# Patient Record
Sex: Male | Born: 1965 | Race: Black or African American | Hispanic: No | Marital: Married | Smoking: Former smoker
Health system: Southern US, Community
[De-identification: ages and names within clinical notes are randomized; demographics above are authoritative.]

## PROBLEM LIST (undated history)

## (undated) DIAGNOSIS — M25551 Pain in right hip: Secondary | ICD-10-CM

## (undated) DIAGNOSIS — F17200 Nicotine dependence, unspecified, uncomplicated: Secondary | ICD-10-CM

## (undated) DIAGNOSIS — I739 Peripheral vascular disease, unspecified: Secondary | ICD-10-CM

## (undated) DIAGNOSIS — M1990 Unspecified osteoarthritis, unspecified site: Secondary | ICD-10-CM

## (undated) DIAGNOSIS — J189 Pneumonia, unspecified organism: Secondary | ICD-10-CM

## (undated) DIAGNOSIS — J45909 Unspecified asthma, uncomplicated: Secondary | ICD-10-CM

## (undated) DIAGNOSIS — E119 Type 2 diabetes mellitus without complications: Secondary | ICD-10-CM

## (undated) DIAGNOSIS — I10 Essential (primary) hypertension: Secondary | ICD-10-CM

## (undated) DIAGNOSIS — G8929 Other chronic pain: Secondary | ICD-10-CM

## (undated) DIAGNOSIS — K219 Gastro-esophageal reflux disease without esophagitis: Secondary | ICD-10-CM

## (undated) DIAGNOSIS — L309 Dermatitis, unspecified: Secondary | ICD-10-CM

## (undated) DIAGNOSIS — G4733 Obstructive sleep apnea (adult) (pediatric): Secondary | ICD-10-CM

## (undated) DIAGNOSIS — M549 Dorsalgia, unspecified: Secondary | ICD-10-CM

## (undated) HISTORY — DX: Unspecified osteoarthritis, unspecified site: M19.90

## (undated) HISTORY — PX: OTHER SURGICAL HISTORY: SHX169

## (undated) HISTORY — DX: Obstructive sleep apnea (adult) (pediatric): G47.33

## (undated) HISTORY — DX: Dermatitis, unspecified: L30.9

## (undated) HISTORY — PX: CYSTECTOMY: SUR359

## (undated) HISTORY — DX: Gastro-esophageal reflux disease without esophagitis: K21.9

## (undated) HISTORY — PX: FINGER SURGERY: SHX640

## (undated) HISTORY — PX: KNEE SURGERY: SHX244

---

## 1999-07-26 ENCOUNTER — Encounter: Payer: Self-pay | Admitting: Cardiology

## 2001-01-03 ENCOUNTER — Emergency Department (HOSPITAL_COMMUNITY): Admission: EM | Admit: 2001-01-03 | Discharge: 2001-01-03 | Payer: Self-pay | Admitting: Emergency Medicine

## 2001-01-03 ENCOUNTER — Encounter: Payer: Self-pay | Admitting: Emergency Medicine

## 2001-04-03 ENCOUNTER — Emergency Department (HOSPITAL_COMMUNITY): Admission: EM | Admit: 2001-04-03 | Discharge: 2001-04-03 | Payer: Self-pay | Admitting: *Deleted

## 2001-09-29 ENCOUNTER — Encounter: Payer: Self-pay | Admitting: *Deleted

## 2001-09-29 ENCOUNTER — Emergency Department (HOSPITAL_COMMUNITY): Admission: EM | Admit: 2001-09-29 | Discharge: 2001-09-29 | Payer: Self-pay | Admitting: *Deleted

## 2001-10-30 ENCOUNTER — Emergency Department (HOSPITAL_COMMUNITY): Admission: EM | Admit: 2001-10-30 | Discharge: 2001-10-30 | Payer: Self-pay | Admitting: *Deleted

## 2001-12-23 ENCOUNTER — Emergency Department (HOSPITAL_COMMUNITY): Admission: EM | Admit: 2001-12-23 | Discharge: 2001-12-23 | Payer: Self-pay | Admitting: *Deleted

## 2001-12-23 ENCOUNTER — Encounter: Payer: Self-pay | Admitting: *Deleted

## 2002-02-15 ENCOUNTER — Emergency Department (HOSPITAL_COMMUNITY): Admission: EM | Admit: 2002-02-15 | Discharge: 2002-02-15 | Payer: Self-pay | Admitting: Emergency Medicine

## 2002-11-09 ENCOUNTER — Emergency Department (HOSPITAL_COMMUNITY): Admission: EM | Admit: 2002-11-09 | Discharge: 2002-11-09 | Payer: Self-pay | Admitting: *Deleted

## 2004-12-28 ENCOUNTER — Emergency Department (HOSPITAL_COMMUNITY): Admission: EM | Admit: 2004-12-28 | Discharge: 2004-12-28 | Payer: Self-pay | Admitting: Emergency Medicine

## 2005-03-04 ENCOUNTER — Inpatient Hospital Stay (HOSPITAL_COMMUNITY): Admission: EM | Admit: 2005-03-04 | Discharge: 2005-03-05 | Payer: Self-pay | Admitting: *Deleted

## 2005-04-03 ENCOUNTER — Ambulatory Visit: Payer: Self-pay | Admitting: Internal Medicine

## 2005-04-03 ENCOUNTER — Inpatient Hospital Stay (HOSPITAL_COMMUNITY): Admission: AD | Admit: 2005-04-03 | Discharge: 2005-04-09 | Payer: Self-pay | Admitting: Orthopedic Surgery

## 2006-07-27 ENCOUNTER — Emergency Department (HOSPITAL_COMMUNITY): Admission: EM | Admit: 2006-07-27 | Discharge: 2006-07-27 | Payer: Self-pay | Admitting: Emergency Medicine

## 2006-09-19 ENCOUNTER — Emergency Department (HOSPITAL_COMMUNITY): Admission: EM | Admit: 2006-09-19 | Discharge: 2006-09-19 | Payer: Self-pay | Admitting: Emergency Medicine

## 2006-10-05 ENCOUNTER — Emergency Department (HOSPITAL_COMMUNITY): Admission: EM | Admit: 2006-10-05 | Discharge: 2006-10-05 | Payer: Self-pay | Admitting: Emergency Medicine

## 2007-05-01 ENCOUNTER — Emergency Department (HOSPITAL_COMMUNITY): Admission: EM | Admit: 2007-05-01 | Discharge: 2007-05-01 | Payer: Self-pay | Admitting: Emergency Medicine

## 2007-07-29 ENCOUNTER — Emergency Department (HOSPITAL_COMMUNITY): Admission: EM | Admit: 2007-07-29 | Discharge: 2007-07-29 | Payer: Self-pay | Admitting: Emergency Medicine

## 2007-08-14 ENCOUNTER — Encounter
Admission: RE | Admit: 2007-08-14 | Discharge: 2007-08-14 | Payer: Self-pay | Admitting: Physical Medicine & Rehabilitation

## 2007-08-14 ENCOUNTER — Ambulatory Visit: Payer: Self-pay | Admitting: Physical Medicine & Rehabilitation

## 2007-11-05 ENCOUNTER — Encounter
Admission: RE | Admit: 2007-11-05 | Discharge: 2007-11-07 | Payer: Self-pay | Admitting: Physical Medicine & Rehabilitation

## 2007-11-07 ENCOUNTER — Ambulatory Visit: Payer: Self-pay | Admitting: Physical Medicine & Rehabilitation

## 2007-12-02 ENCOUNTER — Emergency Department (HOSPITAL_COMMUNITY): Admission: EM | Admit: 2007-12-02 | Discharge: 2007-12-02 | Payer: Self-pay | Admitting: Emergency Medicine

## 2008-01-29 ENCOUNTER — Encounter
Admission: RE | Admit: 2008-01-29 | Discharge: 2008-01-30 | Payer: Self-pay | Admitting: Physical Medicine & Rehabilitation

## 2008-01-30 ENCOUNTER — Ambulatory Visit: Payer: Self-pay | Admitting: Physical Medicine & Rehabilitation

## 2008-03-30 ENCOUNTER — Emergency Department (HOSPITAL_COMMUNITY): Admission: EM | Admit: 2008-03-30 | Discharge: 2008-03-30 | Payer: Self-pay | Admitting: Emergency Medicine

## 2008-04-21 ENCOUNTER — Encounter (HOSPITAL_COMMUNITY): Admission: RE | Admit: 2008-04-21 | Discharge: 2008-05-21 | Payer: Self-pay | Admitting: Family Medicine

## 2008-04-26 ENCOUNTER — Encounter
Admission: RE | Admit: 2008-04-26 | Discharge: 2008-05-03 | Payer: Self-pay | Admitting: Physical Medicine & Rehabilitation

## 2008-05-03 ENCOUNTER — Ambulatory Visit: Payer: Self-pay | Admitting: Physical Medicine & Rehabilitation

## 2008-05-15 ENCOUNTER — Emergency Department (HOSPITAL_COMMUNITY): Admission: EM | Admit: 2008-05-15 | Discharge: 2008-05-16 | Payer: Self-pay | Admitting: Emergency Medicine

## 2008-05-31 ENCOUNTER — Ambulatory Visit (HOSPITAL_COMMUNITY): Admission: RE | Admit: 2008-05-31 | Discharge: 2008-05-31 | Payer: Self-pay | Admitting: Family Medicine

## 2008-08-17 ENCOUNTER — Encounter
Admission: RE | Admit: 2008-08-17 | Discharge: 2008-08-19 | Payer: Self-pay | Admitting: Physical Medicine and Rehabilitation

## 2008-08-19 ENCOUNTER — Ambulatory Visit (HOSPITAL_COMMUNITY)
Admission: RE | Admit: 2008-08-19 | Discharge: 2008-08-19 | Payer: Self-pay | Admitting: Physical Medicine and Rehabilitation

## 2008-08-19 ENCOUNTER — Ambulatory Visit: Payer: Self-pay | Admitting: Physical Medicine and Rehabilitation

## 2008-12-26 ENCOUNTER — Ambulatory Visit: Admission: RE | Admit: 2008-12-26 | Discharge: 2008-12-26 | Payer: Self-pay | Admitting: Family Medicine

## 2009-08-26 ENCOUNTER — Emergency Department (HOSPITAL_COMMUNITY): Admission: EM | Admit: 2009-08-26 | Discharge: 2009-08-26 | Payer: Self-pay | Admitting: Emergency Medicine

## 2009-08-28 ENCOUNTER — Emergency Department (HOSPITAL_COMMUNITY): Admission: EM | Admit: 2009-08-28 | Discharge: 2009-08-28 | Payer: Self-pay | Admitting: Emergency Medicine

## 2009-10-31 ENCOUNTER — Emergency Department (HOSPITAL_COMMUNITY): Admission: EM | Admit: 2009-10-31 | Discharge: 2009-10-31 | Payer: Self-pay | Admitting: Emergency Medicine

## 2010-06-21 ENCOUNTER — Emergency Department (HOSPITAL_COMMUNITY)
Admission: EM | Admit: 2010-06-21 | Discharge: 2010-06-21 | Payer: Self-pay | Source: Home / Self Care | Admitting: Emergency Medicine

## 2010-07-09 ENCOUNTER — Emergency Department (HOSPITAL_COMMUNITY)
Admission: EM | Admit: 2010-07-09 | Discharge: 2010-07-09 | Payer: Self-pay | Source: Home / Self Care | Admitting: Emergency Medicine

## 2010-07-17 LAB — GLUCOSE, CAPILLARY: Glucose-Capillary: 103 mg/dL — ABNORMAL HIGH (ref 70–99)

## 2010-07-24 ENCOUNTER — Encounter: Payer: Self-pay | Admitting: Family Medicine

## 2010-09-09 ENCOUNTER — Emergency Department (HOSPITAL_COMMUNITY): Payer: Medicare Other

## 2010-09-09 ENCOUNTER — Emergency Department (HOSPITAL_COMMUNITY)
Admission: EM | Admit: 2010-09-09 | Discharge: 2010-09-09 | Disposition: A | Discharge status: Home / Self Care | Payer: Medicare Other | Attending: Emergency Medicine | Admitting: Emergency Medicine

## 2010-09-09 DIAGNOSIS — R079 Chest pain, unspecified: Secondary | ICD-10-CM | POA: Insufficient documentation

## 2010-09-09 DIAGNOSIS — M79609 Pain in unspecified limb: Secondary | ICD-10-CM | POA: Insufficient documentation

## 2010-09-09 DIAGNOSIS — IMO0001 Reserved for inherently not codable concepts without codable children: Secondary | ICD-10-CM | POA: Insufficient documentation

## 2010-09-09 DIAGNOSIS — M25569 Pain in unspecified knee: Secondary | ICD-10-CM | POA: Insufficient documentation

## 2010-09-09 DIAGNOSIS — M5430 Sciatica, unspecified side: Secondary | ICD-10-CM | POA: Insufficient documentation

## 2010-09-09 DIAGNOSIS — M545 Low back pain, unspecified: Secondary | ICD-10-CM | POA: Insufficient documentation

## 2010-09-09 DIAGNOSIS — R0602 Shortness of breath: Secondary | ICD-10-CM | POA: Insufficient documentation

## 2010-09-09 DIAGNOSIS — R609 Edema, unspecified: Secondary | ICD-10-CM | POA: Insufficient documentation

## 2010-09-09 LAB — COMPREHENSIVE METABOLIC PANEL
ALT: 38 U/L (ref 0–53)
CO2: 24 mEq/L (ref 19–32)
Calcium: 8.8 mg/dL (ref 8.4–10.5)
Chloride: 105 mEq/L (ref 96–112)
Creatinine, Ser: 1.21 mg/dL (ref 0.4–1.5)
GFR calc non Af Amer: >60 mL/min (ref >60)
Glucose, Bld: 103 mg/dL — ABNORMAL HIGH (ref 70–99)
Total Bilirubin: 0.3 mg/dL (ref 0.3–1.2)

## 2010-09-09 LAB — CBC
MCH: 27.9 pg (ref 26.0–34.0)
MCHC: 32.9 g/dL (ref 30.0–36.0)
RDW: 14 % (ref 11.5–15.5)

## 2010-09-09 LAB — POCT CARDIAC MARKERS: Myoglobin, poc: 110 ng/mL (ref 12–200)

## 2010-09-11 LAB — CBC
MCV: 83.4 fL (ref 78.0–100.0)
Platelets: 237 10*3/uL (ref 150–400)
RBC: 5.05 MIL/uL (ref 4.22–5.81)
RDW: 13.5 % (ref 11.5–15.5)
WBC: 6.1 10*3/uL (ref 4.0–10.5)

## 2010-09-11 LAB — DIFFERENTIAL
Basophils Absolute: 0 10*3/uL (ref 0.0–0.1)
Eosinophils Relative: 2 % (ref 0–5)
Lymphocytes Relative: 38 % (ref 12–46)
Lymphs Abs: 2.3 10*3/uL (ref 0.7–4.0)
Neutro Abs: 3 10*3/uL (ref 1.7–7.7)
Neutrophils Relative %: 49 % (ref 43–77)

## 2010-09-11 LAB — BASIC METABOLIC PANEL
BUN: 18 mg/dL (ref 6–23)
Chloride: 103 mEq/L (ref 96–112)
Creatinine, Ser: 1.33 mg/dL (ref 0.4–1.5)
GFR calc Af Amer: >60 mL/min (ref >60)
GFR calc non Af Amer: 58 mL/min — ABNORMAL LOW (ref >60)

## 2010-09-11 LAB — POCT CARDIAC MARKERS
CKMB, poc: 4 ng/mL (ref 1.0–8.0)
Myoglobin, poc: 378 ng/mL (ref 12–200)
Troponin i, poc: <0.05 ng/mL (ref 0.00–0.09)

## 2010-09-20 LAB — CULTURE, ROUTINE-ABSCESS: Culture: NO GROWTH
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
## 2010-11-14 NOTE — Procedures (Signed)
NAME:  Terry Case, Terry Case NO.:  [id number]   MEDICAL RECORD NO.:  [id number]          PATIENT TYPE:  OUT   LOCATION:  SLEE                          FACILITY:  [facility]   PHYSICIAN:  Kofi A. Gerilyn Pilgrim, M.D. DATE OF BIRTH:  April 24, 1966   DATE OF PROCEDURE:  12/26/2008  DATE OF DISCHARGE:  12/26/2008                             SLEEP DISORDER REPORT   REFERRING PHYSICIAN:  Annia Friendly. Hill, MD   INDICATIONS:  A 45 year old man who presents with marked hypersomnia and  snoring.  He is being evaluated for obstructive sleep apnea syndrome.   MEDICATIONS:  Claritin, doxycycline, amphetamine, Pepcid, hydroxyzine.   EPWORTH SLEEPINESS SCALE:  1. BMI 37.   ARCHITECTURAL SUMMARY:  This is a split night recording with the first  part being a diagnostic and the second part a titration recording.  The  total recording time is 4-9 minutes.  Sleep efficiency 90.9%, sleep  latency 16 minutes, REM latency 83.5 minutes.   RESPIRATORY SUMMARY:  Baseline oxygen saturation 95, lowest saturation  74.  The diagnostic AHI is 28.  The patient was titrated between  pressures of 5 and 14.  The optimal pressure is 13 with resolution of  events and good tolerability.   LIMB MOVEMENTS SUMMARY:  PLM index -   ELECTROCARDIOGRAM SUMMARY:  Average heart rate 70 with no significant  dysrhythmias observed.   IMPRESSION:  1. Moderate obstructive sleep apnea syndrome which responds well to a      continuous positive airway pressure of 13.  2. Moderate-to-severe periodic limb movement disorder sleep.      Kofi A. Gerilyn Pilgrim, M.D.  Electronically Signed     KAD/MEDQ  D:  01/04/2009  T:  01/04/2009  Job:  [id number]

## 2010-11-14 NOTE — Assessment & Plan Note (Signed)
Terry Case returns to clinic today for followup evaluation.  Reports  that he is using a Duragesic patch at 25 mcg per hour, but gets only  fair relief.  He reports that he has significant pain in his left lower  back which he tries to also treat using hot showers.  That only helps  temporarily.  He would like to have an adjustment of his pain medicines.   MEDICATIONS:  1. Allegra 180 mg daily.  2. Pepcid 20 mg daily p.r.n.  3. Adderall 10 mg daily.  4. Duragesic patch 25 mcg per hour, change q.72 h.   REVIEW OF SYSTEMS:  Noncontributory.   PHYSICAL EXAMINATION:  GENERAL:  A well-appearing, well-nourished adult  male in moderate acute discomfort.  VITAL SIGNS:  Not obtained in the office today.  NEUROLOGICAL:  He is 5/5 strength throughout.  He has decreased lumbar  range of motion especially in lateral bending and rotation towards the  left.   IMPRESSION:  1. Chronic low back pain.  Reported work injury 2001.  2. History of narcolepsy presently on Adderall.  3. History of drug abuse previously incarcerated with drug abstinence      for approximately one year.   In the office today we did increase the patient's Duragesic patch to a  50 mcg per hour patch as of Nov 21, 2007.  We also completed paperwork  so that he can have his office note sent to person helping him with  disability application in [place].  Will plan on seeing the patient in  followup in this office in approximately three months' time.   I have warned the patient against misuse of the medication.  He reports  that he will have hang on to the prescription, and use the present  Duragesic patch at 25 mcg per hour strength until the refill is due Nov 21, 2007.           ______________________________  Ellwood Dense, M.D.     DC/MedQ  D:  11/07/2007 12:49:04  T:  11/07/2007 13:31:13  Job #:  [id number]

## 2010-11-14 NOTE — Assessment & Plan Note (Signed)
HISTORY OF PRESENT ILLNESS:  Terry Case returns to the clinic today  for followup evaluation. He was first and last seen in this office on  August 18, 2007 for evaluation of chronic low back pain. At that time,  we decided to continue the Tramadol and increased it up to 50 mg q.i.d.  p.r.n. We also gave him a prescription for Duragesic patch to be used 25  mcg per hour, change q.72 hours. He reports that he became somewhat  confused, taking the Ultram and the Duragesic. He tried taking the  Ultram by itself and also became confused, so he decided to stop that  medication. He has been using the Duragesic patch at 25 mcg q. hour,  change q.72 hours and getting reasonable relief. He does need a refill  on that in the office today. He is not taking any Ultram at this point.   MEDICATIONS:  1. Allegra 180 mg.  2. Pepcid (not using.)  3. Adderall 10 mg daily.  4. Pepcid 20 mg daily.  5. Duragesic patch 25 mcg per hour, change q.72 hours.   REVIEW OF SYSTEMS:  Noncontributory.   PHYSICAL EXAMINATION:  GENERAL:  A well appearing, overweight, adult  male. In mild, no acute discomfort.  VITAL SIGNS:  Not obtained in the office today.  NEUROLOGIC:  He has 5/5 strength throughout. Bulk and tone were normal  and reflexes were 2+ and symmetrical. Sensation was intact to light  touch throughout the bilateral upper and lower extremities.   IMPRESSION:  1. Chronic low back pain with reported work injury in 2001.  2. History of narcolepsy, presently on Adderall.  3. History of drug abuse, previously incarcerated, with drug      abstinence for approximately 1 year.   PLAN:  In the office today, we did refill the patient's Duragesic patch  at 25 mcg per hour, change q.72 hours. He will not be taking the Ultram  at all at this point. We will plan on seeing him in followup in  approximately 2 months time with refill prior to that appointment as  necessary.     ______________________________  Ellwood Dense, M.D.     DC/MedQ  D:  09/12/2007 13:12:43  T:  09/12/2007 20:36:37  Job #:  [id number]
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
## 2010-11-14 NOTE — Assessment & Plan Note (Signed)
Mr. Decou returned to clinic today for followup evaluation.  He does  report that the Duragesic patch had a 15 mcg/hr strength, is helping him  more than the 25 mcg strength.  He reports that he has an occasional  diarrhea, which comes and goes, but he feels that may be related to some  of his other medicines.  He stopped his Allegra, but continues to take  other medicines as noted below.  The patient does need a refill on his  Duragesic patch over the next few days.   MEDICATIONS:  1. Pepcid 20 mg daily p.r.n.  2. Adderall 10 mg daily.  3. Duragesic patch 15 mcg/hr, changed every 72 hours.  4. hydroxyzine p.r.n.   REVIEW OF SYSTEMS:  Noncontributory.   PHYSICAL EXAMINATION:  GENERAL:  Well-appearing, well-nourished adult  male in mild-to-no acute discomfort.  VITAL SIGNS:  Not obtained.  He has 5/5 strength throughout.  He has  decreased range of motion in his lumbar spine, especially in lateral  bending and rotation.   IMPRESSION:  1. Chronic low back pain after reported work injury in 2001.  2. History of narcolepsy, presently on Adderall.  3. History of drug abuse, recently incarcerated with drug abstinence      for approximately 1 year.   In the office today, we did refill the patient's Duragesic patch at 15  mcg/hr strength, changed every 72 hours as of February 06, 2008.  No other  medication refills are necessary at this time.  We will continue to see  the patient in followup in approximately 3 months' time with refills  prior to that appointment if necessary.           ______________________________  Ellwood Dense, M.D.     DC/MedQ  D:  01/30/2008 13:06:21  T:  01/31/2008 04:52:41  Job #:  [id number]
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
## 2011-03-18 ENCOUNTER — Emergency Department (HOSPITAL_COMMUNITY)
Admission: EM | Admit: 2011-03-18 | Discharge: 2011-03-18 | Disposition: A | Discharge status: Home / Self Care | Payer: Medicare Other | Attending: Emergency Medicine | Admitting: Emergency Medicine

## 2011-03-18 ENCOUNTER — Encounter: Payer: Self-pay | Admitting: *Deleted

## 2011-03-18 DIAGNOSIS — F17200 Nicotine dependence, unspecified, uncomplicated: Secondary | ICD-10-CM | POA: Insufficient documentation

## 2011-03-18 DIAGNOSIS — M549 Dorsalgia, unspecified: Secondary | ICD-10-CM | POA: Insufficient documentation

## 2011-03-18 DIAGNOSIS — K219 Gastro-esophageal reflux disease without esophagitis: Secondary | ICD-10-CM

## 2011-03-18 DIAGNOSIS — R1013 Epigastric pain: Secondary | ICD-10-CM | POA: Insufficient documentation

## 2011-03-18 HISTORY — DX: Other chronic pain: G89.29

## 2011-03-18 HISTORY — DX: Dorsalgia, unspecified: M54.9

## 2011-03-18 LAB — COMPREHENSIVE METABOLIC PANEL
ALT: 33 U/L (ref 0–53)
Alkaline Phosphatase: 94 U/L (ref 39–117)
BUN: 15 mg/dL (ref 6–23)
CO2: 26 mEq/L (ref 19–32)
Calcium: 9.2 mg/dL (ref 8.4–10.5)
GFR calc Af Amer: >60 mL/min (ref >60)
GFR calc non Af Amer: >60 mL/min (ref >60)
Glucose, Bld: 88 mg/dL (ref 70–99)
Potassium: 3.9 mEq/L (ref 3.5–5.1)
Sodium: 136 mEq/L (ref 135–145)

## 2011-03-18 LAB — URINALYSIS, ROUTINE W REFLEX MICROSCOPIC
Bilirubin Urine: NEGATIVE
Glucose, UA: NEGATIVE mg/dL
Hgb urine dipstick: NEGATIVE
Specific Gravity, Urine: 1.02 (ref 1.005–1.030)
Urobilinogen, UA: 0.2 mg/dL (ref 0.0–1.0)
pH: 8.5 — ABNORMAL HIGH (ref 5.0–8.0)

## 2011-03-18 LAB — CBC
Hemoglobin: 14.4 g/dL (ref 13.0–17.0)
MCH: 28 pg (ref 26.0–34.0)
MCV: 84.4 fL (ref 78.0–100.0)
Platelets: 232 10*3/uL (ref 150–400)
RBC: 5.14 MIL/uL (ref 4.22–5.81)
WBC: 6 10*3/uL (ref 4.0–10.5)

## 2011-03-18 LAB — DIFFERENTIAL
Eosinophils Relative: 2 % (ref 0–5)
Lymphocytes Relative: 31 % (ref 12–46)
Lymphs Abs: 1.9 10*3/uL (ref 0.7–4.0)
Monocytes Relative: 9 % (ref 3–12)

## 2011-03-18 LAB — LIPASE, BLOOD: Lipase: 45 U/L (ref 11–59)

## 2011-03-18 MED ORDER — FAMOTIDINE 20 MG PO TABS
20.0000 mg | ORAL_TABLET | Freq: Once | ORAL | Status: AC
  Administered 2011-03-18: 20 mg via ORAL
  Filled 2011-03-18: qty 1

## 2011-03-18 MED ORDER — PANTOPRAZOLE SODIUM 40 MG PO TBEC
40.0000 mg | DELAYED_RELEASE_TABLET | Freq: Once | ORAL | Status: AC
  Administered 2011-03-18: 40 mg via ORAL
  Filled 2011-03-18: qty 1

## 2011-03-18 MED ORDER — OMEPRAZOLE 20 MG PO CPDR: 20.0000 mg | DELAYED_RELEASE_CAPSULE | Freq: Every day | ORAL | Status: DC

## 2011-03-18 MED ORDER — GI COCKTAIL ~~LOC~~
30.0000 mL | Freq: Once | ORAL | Status: AC
  Administered 2011-03-18: 30 mL via ORAL
  Filled 2011-03-18: qty 30

## 2011-03-18 MED ORDER — FAMOTIDINE 20 MG PO TABS: 20.0000 mg | ORAL_TABLET | Freq: Two times a day (BID) | ORAL | Status: DC

## 2011-03-18 NOTE — ED Notes (Signed)
Pt states intermittent abdominal pain began last night. States, "It feels like a ball in my stomach and goes all the way up to my throat. It is sharp and severe and comes about every eight minutes." per pt. Denies N/V/D. Last BM was this morning.

## 2011-03-18 NOTE — ED Provider Notes (Signed)
Scribed for Terry Bailiff, MD, the patient was seen in room APA19/APA19 . This chart was scribed by Ellie Lunch. This patient's care was started at 7:40 PM.   CSN: [id number] Arrival date & time: 03/18/2011  6:04 PM  Chief Complaint  Patient presents with  . Abdominal Pain   (Include location/radiation/quality/duration/timing/severity/associated sxs/prior treatment) HPI Terry Case is a 45 y.o. male who presents to the Emergency Department complaining of intermittent epigastric abdominal pain beginning last night ~7:30. Starts in epigastric abdomen and radiates upward. Gets worse after he eats. Gets worse when he lays down. Reports a history of GERD "while ago". Pt took baking soda and zantac. Denies bloody stools Denies N/V/D.   HPI ELEMENTS:  Location: epigastric abdomen Onset: last night  Timing: intermittent  Quality: sharp Severity: severe Modifying factors: aggravated by eating esp fried foods, supine position Context:as above    PCP Dr. Loleta Chance  Past Medical History  Diagnosis Date  . Chronic back pain     Past Surgical History  Procedure Date  . Arm surgery    No family history on file.  History  Substance Use Topics  . Smoking status: Current Everyday Smoker  . Smokeless tobacco: Not on file  . Alcohol Use: No    Review of Systems 10 Systems reviewed and are negative for acute change except as noted in the HPI.  Allergies  Vicodin  Home Medications   Current Outpatient Rx  Name Route Sig Dispense Refill  . FAMOTIDINE 20 MG PO TABS Oral Take 1 tablet (20 mg total) by mouth 2 (two) times daily. 60 tablet 0  . OMEPRAZOLE 20 MG PO CPDR Oral Take 1 capsule (20 mg total) by mouth daily. 30 capsule 0    Physical Exam    BP 140/82  Pulse 75  Temp(Src) 98.7 F (37.1 C) (Oral)  Resp 16  Ht 5\' 8"  (1.727 m)  Wt 260 lb (117.935 kg)  BMI 39.53 kg/m2  SpO2 97%  Physical Exam  Nursing note and vitals reviewed. Constitutional: He is oriented to person,  place, and time. He appears well-developed and well-nourished.  HENT:  Head: Normocephalic and atraumatic.  Eyes: Conjunctivae and EOM are normal. Pupils are equal, round, and reactive to light.  Neck: Normal range of motion. Neck supple.  Cardiovascular: Normal rate, regular rhythm and normal heart sounds.   Pulmonary/Chest: Effort normal and breath sounds normal.  Abdominal: Soft. There is no tenderness.  Musculoskeletal: Normal range of motion.       No CVA tenderness  Neurological: He is alert and oriented to person, place, and time.  Skin: Skin is warm and dry.  Psychiatric: He has a normal mood and affect.   Procedures  OTHER DATA REVIEWED: Nursing notes, vital signs, and past medical records reviewed.  DIAGNOSTIC STUDIES: Oxygen Saturation is 97% on room air, normal by my interpretation.    LABS / RADIOLOGY:  Results for orders placed during the hospital encounter of 03/18/11  URINALYSIS, ROUTINE W REFLEX MICROSCOPIC      Component Value Range   Color, Urine YELLOW  YELLOW    Appearance CLEAR  CLEAR    Specific Gravity, Urine 1.020  1.005 - 1.030    pH 8.5 (*) 5.0 - 8.0    Glucose, UA NEGATIVE  NEGATIVE (mg/dL)   Hgb urine dipstick NEGATIVE  NEGATIVE    Bilirubin Urine NEGATIVE  NEGATIVE    Ketones, ur NEGATIVE  NEGATIVE (mg/dL)   Protein, ur NEGATIVE  NEGATIVE (mg/dL)  Urobilinogen, UA 0.2  0.0 - 1.0 (mg/dL)   Nitrite NEGATIVE  NEGATIVE    Leukocytes, UA NEGATIVE  NEGATIVE   CBC      Component Value Range   WBC 6.0  4.0 - 10.5 (K/uL)   RBC 5.14  4.22 - 5.81 (MIL/uL)   Hemoglobin 14.4  13.0 - 17.0 (g/dL)   HCT 16.1  09.6 - 04.5 (%)   MCV 84.4  78.0 - 100.0 (fL)   MCH 28.0  26.0 - 34.0 (pg)   MCHC 33.2  30.0 - 36.0 (g/dL)   RDW 40.9  81.1 - 91.4 (%)   Platelets 232  150 - 400 (K/uL)  DIFFERENTIAL      Component Value Range   Neutrophils Relative 57  43 - 77 (%)   Neutro Abs 3.4  1.7 - 7.7 (K/uL)   Lymphocytes Relative 31  12 - 46 (%)   Lymphs Abs 1.9  0.7  - 4.0 (K/uL)   Monocytes Relative 9  3 - 12 (%)   Monocytes Absolute 0.5  0.1 - 1.0 (K/uL)   Eosinophils Relative 2  0 - 5 (%)   Eosinophils Absolute 0.1  0.0 - 0.7 (K/uL)   Basophils Relative 1  0 - 1 (%)   Basophils Absolute 0.0  0.0 - 0.1 (K/uL)  COMPREHENSIVE METABOLIC PANEL      Component Value Range   Sodium 136  135 - 145 (mEq/L)   Potassium 3.9  3.5 - 5.1 (mEq/L)   Chloride 101  96 - 112 (mEq/L)   CO2 26  19 - 32 (mEq/L)   Glucose, Bld 88  70 - 99 (mg/dL)   BUN 15  6 - 23 (mg/dL)   Creatinine, Ser 7.82  0.50 - 1.35 (mg/dL)   Calcium 9.2  8.4 - 95.6 (mg/dL)   Total Protein 7.2  6.0 - 8.3 (g/dL)   Albumin 3.8  3.5 - 5.2 (g/dL)   AST 24  0 - 37 (U/L)   ALT 33  0 - 53 (U/L)   Alkaline Phosphatase 94  39 - 117 (U/L)   Total Bilirubin 0.2 (*) 0.3 - 1.2 (mg/dL)   GFR calc non Af Amer >60  >60 (mL/min)   GFR calc Af Amer >60  >60 (mL/min)  LIPASE, BLOOD      Component Value Range   Lipase 45  11 - 59 (U/L)   ED COURSE / COORDINATION OF CARE: 18:25 Discussed diagnostic probabilities such as GERD and discussed treatment with PT including GI cocktail, Pepcid, and Protonix.  Orders Placed This Encounter  Procedures  . Urinalysis with microscopic  . CBC  . Differential  . Comprehensive metabolic panel  . Lipase, blood   Medications  gi cocktail   famotidine (PEPCID) tablet 20 mg   pantoprazole (PROTONIX) EC tablet 40 mg   19:20 Pt recheck. Discussed lab results with pt and plan to discharge. Pt reports improvement with meds.   MDM: Based on the history and physical examination I feel the patient's symptoms are consistent with gastroesophageal reflux disease. I'm not concerned about cardiac etiology or malignant cause abdominal pain at this time. Patient has no reported blood in the stool. Screening labs were obtained and there no abnormalities. Patient received a GI cocktail, Pepcid, Protonix numerous department with some improvement of his symptoms. At this time I feel the  patient is safe for discharge home with instructions to followup with his primary care physician. I'll prescribe Pepcid and Prilosec. He is provided signs and  symptoms for which to return the emergency department.  SCRIBE ATTESTATION: I personally performed the services described in this documentation, which was scribed in my presence. The recorded information has been reviewed and considered.         Terry Bailiff, MD 03/18/11 [phone]

## 2011-04-03 LAB — DIFFERENTIAL
Basophils Absolute: 0
Eosinophils Relative: 1
Lymphocytes Relative: 13
Neutro Abs: 6.7
Neutrophils Relative %: 81 — ABNORMAL HIGH

## 2011-04-03 LAB — CBC
Platelets: 246
RDW: 14.3

## 2011-04-03 LAB — BLOOD GAS, ARTERIAL
Acid-Base Excess: 0.5
FIO2: 0.21
pCO2 arterial: 42.4
pH, Arterial: 7.39

## 2011-04-03 LAB — CK TOTAL AND CKMB (NOT AT ARMC)
CK, MB: 9.4 — ABNORMAL HIGH
Relative Index: 1
Total CK: 988 — ABNORMAL HIGH

## 2011-04-03 LAB — BASIC METABOLIC PANEL
BUN: 19
Calcium: 9.6
Creatinine, Ser: 1.11
GFR calc non Af Amer: >60
Glucose, Bld: 112 — ABNORMAL HIGH

## 2011-04-03 LAB — POCT CARDIAC MARKERS: CKMB, poc: 9.5

## 2011-04-03 LAB — B-NATRIURETIC PEPTIDE (CONVERTED LAB): Pro B Natriuretic peptide (BNP): <30

## 2011-07-26 DIAGNOSIS — IMO0002 Reserved for concepts with insufficient information to code with codable children: Secondary | ICD-10-CM | POA: Insufficient documentation

## 2011-07-26 DIAGNOSIS — F17200 Nicotine dependence, unspecified, uncomplicated: Secondary | ICD-10-CM | POA: Insufficient documentation

## 2011-07-26 DIAGNOSIS — J069 Acute upper respiratory infection, unspecified: Secondary | ICD-10-CM | POA: Insufficient documentation

## 2011-07-26 DIAGNOSIS — Z79899 Other long term (current) drug therapy: Secondary | ICD-10-CM | POA: Insufficient documentation

## 2011-07-26 DIAGNOSIS — R079 Chest pain, unspecified: Secondary | ICD-10-CM | POA: Insufficient documentation

## 2011-07-26 DIAGNOSIS — K219 Gastro-esophageal reflux disease without esophagitis: Secondary | ICD-10-CM | POA: Insufficient documentation

## 2011-07-27 ENCOUNTER — Encounter (HOSPITAL_COMMUNITY): Payer: Self-pay

## 2011-07-27 ENCOUNTER — Emergency Department (HOSPITAL_COMMUNITY)
Admission: EM | Admit: 2011-07-27 | Discharge: 2011-07-27 | Disposition: A | Discharge status: Home / Self Care | Payer: Medicare Other | Attending: Emergency Medicine | Admitting: Emergency Medicine

## 2011-07-27 ENCOUNTER — Emergency Department (HOSPITAL_COMMUNITY): Payer: Medicare Other

## 2011-07-27 DIAGNOSIS — IMO0002 Reserved for concepts with insufficient information to code with codable children: Secondary | ICD-10-CM | POA: Diagnosis not present

## 2011-07-27 DIAGNOSIS — K219 Gastro-esophageal reflux disease without esophagitis: Secondary | ICD-10-CM | POA: Diagnosis not present

## 2011-07-27 DIAGNOSIS — J069 Acute upper respiratory infection, unspecified: Secondary | ICD-10-CM

## 2011-07-27 DIAGNOSIS — R05 Cough: Secondary | ICD-10-CM | POA: Diagnosis not present

## 2011-07-27 DIAGNOSIS — R0602 Shortness of breath: Secondary | ICD-10-CM | POA: Diagnosis not present

## 2011-07-27 DIAGNOSIS — Z79899 Other long term (current) drug therapy: Secondary | ICD-10-CM | POA: Diagnosis not present

## 2011-07-27 DIAGNOSIS — F17200 Nicotine dependence, unspecified, uncomplicated: Secondary | ICD-10-CM | POA: Diagnosis not present

## 2011-07-27 DIAGNOSIS — R079 Chest pain, unspecified: Secondary | ICD-10-CM | POA: Diagnosis not present

## 2011-07-27 HISTORY — DX: Gastro-esophageal reflux disease without esophagitis: K21.9

## 2011-07-27 MED ORDER — ALBUTEROL SULFATE (5 MG/ML) 0.5% IN NEBU
5.0000 mg | INHALATION_SOLUTION | Freq: Once | RESPIRATORY_TRACT | Status: AC
  Administered 2011-07-27: 5 mg via RESPIRATORY_TRACT
  Filled 2011-07-27: qty 1

## 2011-07-27 MED ORDER — ALBUTEROL SULFATE HFA 108 (90 BASE) MCG/ACT IN AERS
1.0000 | INHALATION_SPRAY | Freq: Four times a day (QID) | RESPIRATORY_TRACT | Status: DC
  Administered 2011-07-27: 1 via RESPIRATORY_TRACT
  Filled 2011-07-27: qty 6.7

## 2011-07-27 MED ORDER — OSELTAMIVIR PHOSPHATE 75 MG PO CAPS: 75.0000 mg | ORAL_CAPSULE | Freq: Once | ORAL | Status: DC

## 2011-07-27 MED ORDER — ALBUTEROL SULFATE HFA 108 (90 BASE) MCG/ACT IN AERS: 2.0000 | INHALATION_SPRAY | RESPIRATORY_TRACT | Status: DC | PRN

## 2011-07-27 MED ORDER — IPRATROPIUM BROMIDE 0.02 % IN SOLN
0.5000 mg | Freq: Once | RESPIRATORY_TRACT | Status: AC
  Administered 2011-07-27: 0.5 mg via RESPIRATORY_TRACT
  Filled 2011-07-27: qty 2.5

## 2011-07-27 MED ORDER — OSELTAMIVIR PHOSPHATE 75 MG PO CAPS
75.0000 mg | ORAL_CAPSULE | Freq: Once | ORAL | Status: AC
  Administered 2011-07-27: 75 mg via ORAL
  Filled 2011-07-27: qty 1

## 2011-07-27 MED ORDER — PREDNISONE 50 MG PO TABS: 50.0000 mg | ORAL_TABLET | Freq: Every day | ORAL | Status: AC

## 2011-07-27 NOTE — ED Provider Notes (Signed)
History     CSN: [id number]  Arrival date & time 07/26/11  2349   First MD Initiated Contact with Patient 07/27/11 0016      Chief Complaint  Patient presents with  . Nasal Congestion    lump on chest    (Consider location/radiation/quality/duration/timing/severity/associated sxs/prior treatment) HPI....cough and chest congestion for 24 hours. Patient is smoker. Nothing makes symptoms better or worse. Described as minimal to moderate. No radiation of pain. Cough is sharp. No fever sweats or chills.      Past Medical History  Diagnosis Date  . Chronic back pain   . Acid reflux     Past Surgical History  Procedure Date  . Arm surgery     No family history on file.  History  Substance Use Topics  . Smoking status: Current Everyday Smoker    Types: Cigarettes  . Smokeless tobacco: Not on file  . Alcohol Use: No      Review of Systems  All other systems reviewed and are negative.    Allergies  Vicodin  Home Medications   Current Outpatient Rx  Name Route Sig Dispense Refill  . FAMOTIDINE 20 MG PO TABS Oral Take 1 tablet (20 mg total) by mouth 2 (two) times daily. 60 tablet 0  . OMEPRAZOLE 20 MG PO CPDR Oral Take 1 capsule (20 mg total) by mouth daily. 30 capsule 0  . PREDNISONE 50 MG PO TABS Oral Take 1 tablet (50 mg total) by mouth daily. 7 tablet 1    BP 143/85  Pulse 76  Temp(Src) 98.7 F (37.1 C) (Oral)  Resp 16  Ht 5\' 8"  (1.727 m)  Wt 260 lb (117.935 kg)  BMI 39.53 kg/m2  SpO2 97%  Physical Exam  Nursing note and vitals reviewed. Constitutional: He is oriented to person, place, and time. He appears well-developed and well-nourished.  HENT:  Head: Normocephalic and atraumatic.  Eyes: Conjunctivae and EOM are normal. Pupils are equal, round, and reactive to light.  Neck: Normal range of motion. Neck supple.  Cardiovascular: Normal rate and regular rhythm.   Pulmonary/Chest: Effort normal.       Slight expiratory wheeze  Abdominal: Soft.  Bowel sounds are normal.  Musculoskeletal: Normal range of motion.  Neurological: He is alert and oriented to person, place, and time.  Skin: Skin is warm and dry.  Psychiatric: He has a normal mood and affect.    ED Course  Procedures (including critical care time)  Labs Reviewed - No data to display Dg Chest 2 View  07/27/2011  *RADIOLOGY REPORT*  Clinical Data: Shortness of breath, medial chest pain, cough and congestion for 2 days; history of smoking.  CHEST - 2 VIEW  Comparison: Chest radiograph performed 09/09/2010  Findings: The lungs are well-aerated.  There is no evidence of focal opacification, pleural effusion or pneumothorax.  Mild vascular congestion is noted.  The heart is normal in size; the mediastinal contour is within normal limits.  No acute osseous abnormalities are seen.  IMPRESSION: Mild vascular congestion; lungs remain grossly clear.  Original Report Authenticated By: Tonia Ghent, M.D.     1. Upper respiratory infection       MDM  Patient is oxygenating well. Chest x-ray shows no obvious pneumonia. We'll discharge home with Tamiflu, albuterol inhaler, prednisone.  Stop smoking        Donnetta Hutching, MD 07/27/11 [id number]

## 2011-07-27 NOTE — ED Notes (Signed)
Sick since yesterday w. Congestion and noted a lump on chest today, sob when laying flat

## 2011-09-21 ENCOUNTER — Encounter (HOSPITAL_COMMUNITY): Payer: Self-pay | Admitting: Emergency Medicine

## 2011-09-21 ENCOUNTER — Other Ambulatory Visit: Payer: Self-pay

## 2011-09-21 ENCOUNTER — Emergency Department (HOSPITAL_COMMUNITY): Payer: Medicare Other

## 2011-09-21 ENCOUNTER — Emergency Department (HOSPITAL_COMMUNITY)
Admission: EM | Admit: 2011-09-21 | Discharge: 2011-09-21 | Disposition: A | Discharge status: Home / Self Care | Payer: Medicare Other | Attending: Emergency Medicine | Admitting: Emergency Medicine

## 2011-09-21 DIAGNOSIS — R079 Chest pain, unspecified: Secondary | ICD-10-CM | POA: Diagnosis not present

## 2011-09-21 DIAGNOSIS — R071 Chest pain on breathing: Secondary | ICD-10-CM | POA: Diagnosis not present

## 2011-09-21 DIAGNOSIS — G8929 Other chronic pain: Secondary | ICD-10-CM | POA: Insufficient documentation

## 2011-09-21 DIAGNOSIS — K219 Gastro-esophageal reflux disease without esophagitis: Secondary | ICD-10-CM | POA: Insufficient documentation

## 2011-09-21 DIAGNOSIS — M549 Dorsalgia, unspecified: Secondary | ICD-10-CM | POA: Insufficient documentation

## 2011-09-21 DIAGNOSIS — R0789 Other chest pain: Secondary | ICD-10-CM

## 2011-09-21 LAB — BASIC METABOLIC PANEL
BUN: 15 mg/dL (ref 6–23)
CO2: 27 mEq/L (ref 19–32)
Calcium: 9.5 mg/dL (ref 8.4–10.5)
GFR calc non Af Amer: 82 mL/min — ABNORMAL LOW (ref >90)
Glucose, Bld: 107 mg/dL — ABNORMAL HIGH (ref 70–99)
Potassium: 4.2 mEq/L (ref 3.5–5.1)

## 2011-09-21 LAB — CBC
HCT: 42.8 % (ref 39.0–52.0)
Hemoglobin: 14 g/dL (ref 13.0–17.0)
MCH: 27.7 pg (ref 26.0–34.0)
MCHC: 32.7 g/dL (ref 30.0–36.0)
RBC: 5.05 MIL/uL (ref 4.22–5.81)

## 2011-09-21 LAB — POCT I-STAT TROPONIN I

## 2011-09-21 MED ORDER — CIPROFLOXACIN HCL 500 MG PO TABS
500 MG | ORAL_TABLET | Freq: Two times a day (BID) | ORAL | Status: AC
Start: 2011-09-21 — End: 2011-09-28

## 2011-09-21 MED ORDER — IBUPROFEN 800 MG PO TABS
800.0000 mg | ORAL_TABLET | Freq: Once | ORAL | Status: AC
  Administered 2011-09-21: 800 mg via ORAL
  Filled 2011-09-21: qty 1

## 2011-09-21 NOTE — Progress Notes (Signed)
Have you seen any other physician or provider since your last visit no    Have you had any other diagnostic tests since your last visit? no    Have you changed or stopped any medications since your last visit including any over-the-counter medicines, vitamins, or herbal medicines? n/a     Are you taking all your prescribed medications? n/a    If NO, why? - n/a            What is your goal for your visit today? - To improve/maintain health  Barriers to success: none  Plan for overcoming my barriers: N/A     Confidence: 9/10  Date goal set: 09/21/2011  Date expected to reach goal:

## 2011-09-21 NOTE — Progress Notes (Signed)
Subjective:      Patient ID: Mark Buchanan is a 46 y.o. male.    HPI  Patient is here due a non healing,open lesion at his right shoulder area for few weeks.denies any pain ,no fever or chills.  Review of Systems  As on HPI  Objective:   Physical Exam   HENT:   Head: Normocephalic.   Neck: Neck supple.   Cardiovascular: Normal rate and regular rhythm.    Pulmonary/Chest: Effort normal and breath sounds normal.   Abdominal: Soft. Bowel sounds are normal.   Musculoskeletal: Normal range of motion.   Neurological: He is alert.   Skin: Skin is warm.   Localized, open, erythematous lesion/ wound at the deltoid area of the right shoulder.       Assessment:      1. Soft tissue infection  ciprofloxacin (CIPRO) 500 MG tablet            Plan:      Current Outpatient Prescriptions   Medication Sig Dispense Refill   . ciprofloxacin (CIPRO) 500 MG tablet Take 1 tablet by mouth 2 times daily for 7 days.  14 tablet  0     No current facility-administered medications for this visit.    prescribed Cipro 500 mg 2 x /day x 7 days;culture to R/O MRSA.follow up as needed.

## 2011-09-21 NOTE — ED Notes (Signed)
Left side chest pain pulling sensation this am. Denies other symptoms.

## 2011-09-21 NOTE — Discharge Instructions (Signed)
Your blood work, heart murmurs, EKG, and chest x-ray were normal here today. The most likely cause of your pain is chest wall pain. He apply heat, take Tylenol, or ibuprofen. Followup with your doctor.   Chest Wall Pain Chest wall pain is pain in or around the bones and muscles of your chest. It may take up to 6 weeks to get better. It may take longer if you must stay physically active in your work and activities.  CAUSES  Chest wall pain may happen on its own. However, it may be caused by:  A viral illness like the flu.   Injury.   Coughing.   Exercise.   Arthritis.   Fibromyalgia.   Shingles.  HOME CARE INSTRUCTIONS   Avoid overtiring physical activity. Try not to strain or perform activities that cause pain. This includes any activities using your chest or your abdominal and side muscles, especially if heavy weights are used.   Put ice on the sore area.   Put ice in a plastic bag.   Place a towel between your skin and the bag.   Leave the ice on for 15 to 20 minutes per hour while awake for the first 2 days.   Only take over-the-counter or prescription medicines for pain, discomfort, or fever as directed by your caregiver.  SEEK IMMEDIATE MEDICAL CARE IF:   Your pain increases, or you are very uncomfortable.   You have a fever.   Your chest pain becomes worse.   You have new, unexplained symptoms.   You have nausea or vomiting.   You feel sweaty or lightheaded.   You have a cough with phlegm (sputum), or you cough up blood.  MAKE SURE YOU:   Understand these instructions.   Will watch your condition.   Will get help right away if you are not doing well or get worse.  Document Released: 06/18/2005 Document Revised: 06/07/2011 Document Reviewed: 02/12/2011 [facility] Patient Information 2012 [place], [place].

## 2011-09-21 NOTE — ED Provider Notes (Signed)
This chart was scribed for [employer]. Colon Branch, MD by Williemae Natter. The patient was seen in room [facility]06[facility]06 at 9:17 AM.  CSN: [id number]  Arrival date & time 09/21/11  4098   First MD Initiated Contact with Patient 09/21/11 0848      Chief Complaint  Patient presents with  . Chest Pain    (Consider location/radiation/quality/duration/timing/severity/associated sxs/prior treatment) Patient is a 46 y.o. male presenting with chest pain. The history is provided by the patient.  Chest Pain The chest pain began 3 - 5 hours ago. Chest pain occurs frequently. The chest pain is unchanged. The severity of the pain is moderate. The quality of the pain is described as pressure-like and tightness. The pain does not radiate. Pertinent negatives for primary symptoms include no fever, no syncope, no nausea and no vomiting.  Pertinent negatives for associated symptoms include no lower extremity edema and no near-syncope. He tried nothing for the symptoms. Risk factors include male gender and obesity.  Pertinent negatives for past medical history include no hypertension, no MI, no recent injury and no strokes.    Terry Case is a 46 y.o. male who presents to the Emergency Department complaining of acute onset frequent chest pain since this morning. Pt feels a grabbing tight sensation in the left side of chest. Pt suspects gas pain since he had beans for dinner yesterday. No known injury mechanism. No bm today. PCP- Dr. Loleta Chance Past Medical History  Diagnosis Date  . Chronic back pain   . Acid reflux     Past Surgical History  Procedure Date  . Arm surgery     History reviewed. No pertinent family history.  History  Substance Use Topics  . Smoking status: Current Everyday Smoker -- 2.0 packs/day    Types: Cigarettes  . Smokeless tobacco: Not on file  . Alcohol Use: No      Review of Systems  Constitutional: Negative for fever.  Cardiovascular: Positive for chest pain. Negative for  syncope and near-syncope.  Gastrointestinal: Negative for nausea and vomiting.  10 Systems reviewed and are negative for acute change except as noted in the HPI.   Allergies  Vicodin  Home Medications   Current Outpatient Rx  Name Route Sig Dispense Refill  . FAMOTIDINE 20 MG PO TABS Oral Take 1 tablet (20 mg total) by mouth 2 (two) times daily. 60 tablet 0  . OMEPRAZOLE 20 MG PO CPDR Oral Take 1 capsule (20 mg total) by mouth daily. 30 capsule 0    BP 142/89  Pulse 67  Temp(Src) 98 F (36.7 C) (Oral)  Resp 18  Ht 5\' 8"  (1.727 m)  Wt 255 lb (115.667 kg)  BMI 38.77 kg/m2  Physical Exam  Nursing note and vitals reviewed. Constitutional: He is oriented to person, place, and time. He appears well-developed and well-nourished.  HENT:  Head: Normocephalic and atraumatic.  Neck: Normal range of motion. Neck supple.  Cardiovascular: Normal rate and regular rhythm.  Exam reveals no friction rub.   No murmur heard.      No clicks  Pulmonary/Chest: Effort normal and breath sounds normal. No respiratory distress.  Abdominal: Soft. Bowel sounds are normal. There is no tenderness.  Musculoskeletal: He exhibits no edema and no tenderness.  Neurological: He is alert and oriented to person, place, and time.  Skin: Skin is warm and dry.  Psychiatric: He has a normal mood and affect. His behavior is normal.    ED Course  Procedures (including critical care time)  DIAGNOSTIC STUDIES:   COORDINATION OF CARE:  Medications  ibuprofen (ADVIL,MOTRIN) tablet 800 mg (800 mg Oral Given 09/21/11 0933)   Results for orders placed during the hospital encounter of 09/21/11  CBC      Component Value Range   WBC 4.7  4.0 - 10.5 (K/uL)   RBC 5.05  4.22 - 5.81 (MIL/uL)   Hemoglobin 14.0  13.0 - 17.0 (g/dL)   HCT 16.1  09.6 - 04.5 (%)   MCV 84.8  78.0 - 100.0 (fL)   MCH 27.7  26.0 - 34.0 (pg)   MCHC 32.7  30.0 - 36.0 (g/dL)   RDW 40.9  81.1 - 91.4 (%)   Platelets 219  150 - 400 (K/uL)  BASIC  METABOLIC PANEL      Component Value Range   Sodium 139  135 - 145 (mEq/L)   Potassium 4.2  3.5 - 5.1 (mEq/L)   Chloride 103  96 - 112 (mEq/L)   CO2 27  19 - 32 (mEq/L)   Glucose, Bld 107 (*) 70 - 99 (mg/dL)   BUN 15  6 - 23 (mg/dL)   Creatinine, Ser 7.82  0.50 - 1.35 (mg/dL)   Calcium 9.5  8.4 - 95.6 (mg/dL)   GFR calc non Af Amer 82 (*) >90 (mL/min)   GFR calc Af Amer >90  >90 (mL/min)  POCT I-STAT TROPONIN I      Component Value Range   Troponin i, poc 0.00  0.00 - 0.08 (ng/mL)   Comment 3              Date: 09/21/2011  2130  Rate: 62  Rhythm: normal sinus rhythm  QRS Axis: normal  Intervals: normal  ST/T Wave abnormalities: normal  Conduction Disutrbances: none  Narrative Interpretation: unremarkable  Chest Portable 1 View  09/21/2011  *RADIOLOGY REPORT*  Clinical Data: Chest pain.  PORTABLE CHEST - 1 VIEW  Comparison: Chest x-ray 07/27/2011.  Findings: Lung volumes are normal.  No consolidative airspace disease.  No pleural effusions.  No pneumothorax.  No pulmonary nodule or mass noted.  Pulmonary vasculature and the cardiomediastinal silhouette are within normal limits.  IMPRESSION: 1. No radiographic evidence of acute cardiopulmonary disease.  Original Report Authenticated By: Florencia Reasons, M.D.   12:33 PM Recheck- pt notified of normal lab results and scans. Pt is ready for discharge.    MDM  Patient here with left-sided chest pain that began this morning. Given IVF, ibuprofen with relief. Labs unremarkable, EKG and chest xray normal. Pain was relieved. Pt feels improved after observation and/or treatment in ED.Pt stable in ED with no significant deterioration in condition.The patient appears reasonably screened and/or stabilized for discharge and I doubt any other medical condition or other [facility] requiring further screening, evaluation, or treatment in the ED at this time prior to discharge.  I personally performed the services described in this documentation, which  was scribed in my presence. The recorded information has been reviewed and considered.   MDM Reviewed: nursing note and vitals Interpretation: labs, ECG and x-ray           Nicoletta Dress. Colon Branch, MD 09/22/11 [id number]

## 2011-09-25 LAB — CULTURE, WOUND

## 2011-10-29 DIAGNOSIS — R079 Chest pain, unspecified: Secondary | ICD-10-CM | POA: Diagnosis not present

## 2011-10-29 DIAGNOSIS — G4733 Obstructive sleep apnea (adult) (pediatric): Secondary | ICD-10-CM | POA: Diagnosis not present

## 2011-10-29 DIAGNOSIS — R9431 Abnormal electrocardiogram [ECG] [EKG]: Secondary | ICD-10-CM | POA: Diagnosis not present

## 2011-11-15 DIAGNOSIS — R079 Chest pain, unspecified: Secondary | ICD-10-CM | POA: Diagnosis not present

## 2011-11-15 DIAGNOSIS — R9431 Abnormal electrocardiogram [ECG] [EKG]: Secondary | ICD-10-CM | POA: Diagnosis not present

## 2011-11-16 DIAGNOSIS — R9431 Abnormal electrocardiogram [ECG] [EKG]: Secondary | ICD-10-CM | POA: Diagnosis not present

## 2011-11-16 DIAGNOSIS — R079 Chest pain, unspecified: Secondary | ICD-10-CM | POA: Diagnosis not present

## 2011-11-21 ENCOUNTER — Encounter: Payer: Self-pay | Admitting: *Deleted

## 2011-11-21 ENCOUNTER — Encounter: Payer: Self-pay | Admitting: Pulmonary Disease

## 2011-11-22 ENCOUNTER — Ambulatory Visit (INDEPENDENT_AMBULATORY_CARE_PROVIDER_SITE_OTHER): Payer: Medicare Other | Admitting: Pulmonary Disease

## 2011-11-22 ENCOUNTER — Encounter: Payer: Self-pay | Admitting: Pulmonary Disease

## 2011-11-22 VITALS — BP 124/74 | HR 76 | Temp 98.5°F | Ht 68.0 in | Wt 268.8 lb

## 2011-11-22 DIAGNOSIS — G4733 Obstructive sleep apnea (adult) (pediatric): Secondary | ICD-10-CM | POA: Diagnosis not present

## 2011-11-22 NOTE — Progress Notes (Signed)
Subjective:    Patient ID: Terry Case, male    DOB: 1965-09-28, 46 y.o.   MRN: [id number]  HPI The patient is a 46 year old male who I've been asked to see for management of obstructive sleep apnea.  The patient had a sleep study in 2010 that showed moderate sleep apnea, with an AHI of 28 events per hour.  He was started on CPAP as part of a split night protocol, and titrated to 13 cm of pressure.  Unfortunately, the patient was never started on CPAP, and comes in today for further evaluation.  He continues to be very symptomatic with loud snoring, as well as an abnormal breathing pattern during sleep.  He has frequent awakenings at night, as well his nonrestorative sleep.  He states his weight is up 60 pounds over the last 2 years, and therefore his sleep apnea is most likely significantly worse.  He notes significant sleep pressure during the day with any period of inactivity, and falls asleep watching television in the evening.  He also has sleep pressure driving longer distances.  The patient's Epworth sleepiness score today is 10  Sleep Questionnaire: What time do you typically go to bed?( Between what hours) 11pm How long does it take you to fall asleep? 30 minutes How many times during the night do you wake up? 1 What time do you get out of bed to start your day? 0830 Do you drive or operate heavy machinery in your occupation? No How much has your weight changed (up or down) over the past two years? (In pounds) 60 lb (27.216 kg) Have you ever had a sleep study before? Yes If yes, location of study? [facility] If yes, date of study? Do you currently use CPAP? No Do you wear oxygen at any time? No    Review of Systems  Constitutional: Positive for unexpected weight change. Negative for fever.  HENT: Negative.  Negative for ear pain, nosebleeds, congestion, sore throat, rhinorrhea, sneezing, trouble swallowing, dental problem, postnasal drip and sinus pressure.   Eyes: Negative.  Negative  for redness and itching.  Respiratory: Negative.  Negative for cough, chest tightness, shortness of breath and wheezing.   Cardiovascular: Negative.  Negative for palpitations and leg swelling.  Gastrointestinal: Negative.  Negative for nausea and vomiting.  Genitourinary: Negative.  Negative for dysuria.  Musculoskeletal: Negative.  Negative for joint swelling.  Skin: Negative for rash.       Itching due to eczema  Neurological: Negative.  Negative for headaches.  Hematological: Negative.  Does not bruise/bleed easily.  Psychiatric/Behavioral: Negative.  Negative for dysphoric mood. The patient is not nervous/anxious.        Objective:   Physical Exam Constitutional:  Overweight male, no acute distress  HENT:  Nares patent without discharge, but enlarged turbinates.  Oropharynx without exudate, palate and uvula are very thick and long.  Small space posteriorly  Eyes:  Perrla, eomi, no scleral icterus  Neck:  No JVD, no TMG  Cardiovascular:  Normal rate, regular rhythm, no rubs or gallops.  No murmurs        Intact distal pulses  Pulmonary :  Normal breath sounds, no stridor or respiratory distress   No rales, rhonchi, or wheezing  Abdominal:  Soft, nondistended, bowel sounds present.  No tenderness noted.   Musculoskeletal:  No lower extremity edema noted.  Lymph Nodes:  No cervical lymphadenopathy noted  Skin:  No cyanosis noted  Neurologic:  Appears very sleepy, appropriate, moves all 4  extremities without obvious deficit.         Assessment & Plan:

## 2011-11-22 NOTE — Assessment & Plan Note (Signed)
The patient has been diagnosed with at least moderate obstructive sleep apnea in the past, and has had worsening symptoms over the last few years.  His weight has also increased by 60 pounds.  I suspect his sleep apnea is much worse than his original diagnosis, and have had a long discussion with him about the pathophysiology of sleep apnea.  I have counseled him about its impact to his quality of life and cardiovascular health, and stressed the need for aggressive treatment.  I have recommended treatment with CPAP while trying to work on weight loss.  The patient is agreeable. I will set the patient up on cpap at a moderate pressure level to allow for desensitization, and will troubleshoot the device over the next 4-6weeks if needed.  The pt is to call me if having issues with tolerance.  Will then optimize the pressure once patient is able to wear cpap on a consistent basis.

## 2011-11-22 NOTE — Patient Instructions (Signed)
Will start on cpap at moderate pressure.  Please call if having issues with tolerance. Work on weight loss followup with me in 5-6 weeks.

## 2011-11-23 DIAGNOSIS — R079 Chest pain, unspecified: Secondary | ICD-10-CM | POA: Diagnosis not present

## 2011-11-23 DIAGNOSIS — R9431 Abnormal electrocardiogram [ECG] [EKG]: Secondary | ICD-10-CM | POA: Diagnosis not present

## 2011-11-23 DIAGNOSIS — G4733 Obstructive sleep apnea (adult) (pediatric): Secondary | ICD-10-CM | POA: Diagnosis not present

## 2011-11-27 DIAGNOSIS — R079 Chest pain, unspecified: Secondary | ICD-10-CM | POA: Diagnosis not present

## 2011-11-27 DIAGNOSIS — E7800 Pure hypercholesterolemia, unspecified: Secondary | ICD-10-CM | POA: Diagnosis not present

## 2011-11-30 ENCOUNTER — Emergency Department (HOSPITAL_COMMUNITY): Payer: Medicare Other

## 2011-11-30 ENCOUNTER — Emergency Department (HOSPITAL_COMMUNITY)
Admission: EM | Admit: 2011-11-30 | Discharge: 2011-11-30 | Disposition: A | Discharge status: Home / Self Care | Payer: Medicare Other | Attending: Emergency Medicine | Admitting: Emergency Medicine

## 2011-11-30 ENCOUNTER — Encounter (HOSPITAL_COMMUNITY): Payer: Self-pay | Admitting: *Deleted

## 2011-11-30 DIAGNOSIS — Z87891 Personal history of nicotine dependence: Secondary | ICD-10-CM | POA: Diagnosis not present

## 2011-11-30 DIAGNOSIS — M549 Dorsalgia, unspecified: Secondary | ICD-10-CM | POA: Insufficient documentation

## 2011-11-30 DIAGNOSIS — M25469 Effusion, unspecified knee: Secondary | ICD-10-CM | POA: Diagnosis not present

## 2011-11-30 DIAGNOSIS — M25569 Pain in unspecified knee: Secondary | ICD-10-CM | POA: Diagnosis not present

## 2011-11-30 DIAGNOSIS — K219 Gastro-esophageal reflux disease without esophagitis: Secondary | ICD-10-CM | POA: Insufficient documentation

## 2011-11-30 DIAGNOSIS — G8929 Other chronic pain: Secondary | ICD-10-CM | POA: Insufficient documentation

## 2011-11-30 DIAGNOSIS — M25869 Other specified joint disorders, unspecified knee: Secondary | ICD-10-CM | POA: Diagnosis not present

## 2011-11-30 DIAGNOSIS — M129 Arthropathy, unspecified: Secondary | ICD-10-CM | POA: Diagnosis not present

## 2011-11-30 DIAGNOSIS — M25562 Pain in left knee: Secondary | ICD-10-CM

## 2011-11-30 DIAGNOSIS — M25561 Pain in right knee: Secondary | ICD-10-CM

## 2011-11-30 MED ORDER — MELOXICAM 7.5 MG PO TABS: ORAL_TABLET | ORAL | Status: DC

## 2011-11-30 MED ORDER — OXYCODONE-ACETAMINOPHEN 5-325 MG PO TABS: 1.0000 | ORAL_TABLET | ORAL | Status: AC | PRN

## 2011-11-30 NOTE — Discharge Instructions (Signed)
Knee Pain The knee is the complex joint between your thigh and your lower leg. It is made up of bones, tendons, ligaments, and cartilage. The bones that make up the knee are:  The femur in the thigh.   The tibia and fibula in the lower leg.   The patella or kneecap riding in the groove on the lower femur.  CAUSES  Knee pain is a common complaint with many causes. A few of these causes are:  Injury, such as:   A ruptured ligament or tendon injury.   Torn cartilage.   Medical conditions, such as:   Gout   Arthritis   Infections   Overuse, over training or overdoing a physical activity.  Knee pain can be minor or severe. Knee pain can accompany debilitating injury. Minor knee problems often respond well to self-care measures or get well on their own. More serious injuries may need medical intervention or even surgery. SYMPTOMS The knee is complex. Symptoms of knee problems can vary widely. Some of the problems are:  Pain with movement and weight bearing.   Swelling and tenderness.   Buckling of the knee.   Inability to straighten or extend your knee.   Your knee locks and you cannot straighten it.   Warmth and redness with pain and fever.   Deformity or dislocation of the kneecap.  DIAGNOSIS  Determining what is wrong may be very straight forward such as when there is an injury. It can also be challenging because of the complexity of the knee. Tests to make a diagnosis may include:  Your caregiver taking a history and doing a physical exam.   Routine X-rays can be used to rule out other problems. X-rays will not reveal a cartilage tear. Some injuries of the knee can be diagnosed by:   Arthroscopy a surgical technique by which a small video camera is inserted through tiny incisions on the sides of the knee. This procedure is used to examine and repair internal knee joint problems. Tiny instruments can be used during arthroscopy to repair the torn knee cartilage  (meniscus).   Arthrography is a radiology technique. A contrast liquid is directly injected into the knee joint. Internal structures of the knee joint then become visible on X-ray film.   An MRI scan is a non x-ray radiology procedure in which magnetic fields and a computer produce two- or three-dimensional images of the inside of the knee. Cartilage tears are often visible using an MRI scanner. MRI scans have largely replaced arthrography in diagnosing cartilage tears of the knee.   Blood work.   Examination of the fluid that helps to lubricate the knee joint (synovial fluid). This is done by taking a sample out using a needle and a syringe.  TREATMENT The treatment of knee problems depends on the cause. Some of these treatments are:  Depending on the injury, proper casting, splinting, surgery or physical therapy care will be needed.   Give yourself adequate recovery time. Do not overuse your joints. If you begin to get sore during workout routines, back off. Slow down or do fewer repetitions.   For repetitive activities such as cycling or running, maintain your strength and nutrition.   Alternate muscle groups. For example if you are a weight lifter, work the upper body on one day and the lower body the next.   Either tight or weak muscles do not give the proper support for your knee. Tight or weak muscles do not absorb the stress placed   on the knee joint. Keep the muscles surrounding the knee strong.   Take care of mechanical problems.   If you have flat feet, orthotics or special shoes may help. See your caregiver if you need help.   Arch supports, sometimes with wedges on the inner or outer aspect of the heel, can help. These can shift pressure away from the side of the knee most bothered by osteoarthritis.   A brace called an "unloader" brace also may be used to help ease the pressure on the most arthritic side of the knee.   If your caregiver has prescribed crutches, braces,  wraps or ice, use as directed. The acronym for this is PRICE. This means protection, rest, ice, compression and elevation.   Nonsteroidal anti-inflammatory drugs (NSAID's), can help relieve pain. But if taken immediately after an injury, they may actually increase swelling. Take NSAID's with food in your stomach. Stop them if you develop stomach problems. Do not take these if you have a history of ulcers, stomach pain or bleeding from the bowel. Do not take without your caregiver's approval if you have problems with fluid retention, heart failure, or kidney problems.   For ongoing knee problems, physical therapy may be helpful.   Glucosamine and chondroitin are over-the-counter dietary supplements. Both may help relieve the pain of osteoarthritis in the knee. These medicines are different from the usual anti-inflammatory drugs. Glucosamine may decrease the rate of cartilage destruction.   Injections of a corticosteroid drug into your knee joint may help reduce the symptoms of an arthritis flare-up. They may provide pain relief that lasts a few months. You may have to wait a few months between injections. The injections do have a small increased risk of infection, water retention and elevated blood sugar levels.   Hyaluronic acid injected into damaged joints may ease pain and provide lubrication. These injections may work by reducing inflammation. A series of shots may give relief for as long as 6 months.   Topical painkillers. Applying certain ointments to your skin may help relieve the pain and stiffness of osteoarthritis. Ask your pharmacist for suggestions. Many over the-counter products are approved for temporary relief of arthritis pain.   In some countries, doctors often prescribe topical NSAID's for relief of chronic conditions such as arthritis and tendinitis. A review of treatment with NSAID creams found that they worked as well as oral medications but without the serious side effects.    PREVENTION  Maintain a healthy weight. Extra pounds put more strain on your joints.   Get strong, stay limber. Weak muscles are a common cause of knee injuries. Stretching is important. Include flexibility exercises in your workouts.   Be smart about exercise. If you have osteoarthritis, chronic knee pain or recurring injuries, you may need to change the way you exercise. This does not mean you have to stop being active. If your knees ache after jogging or playing basketball, consider switching to swimming, water aerobics or other low-impact activities, at least for a few days a week. Sometimes limiting high-impact activities will provide relief.   Make sure your shoes fit well. Choose footwear that is right for your sport.   Protect your knees. Use the proper gear for knee-sensitive activities. Use kneepads when playing volleyball or laying carpet. Buckle your seat belt every time you drive. Most shattered kneecaps occur in car accidents.   Rest when you are tired.  SEEK MEDICAL CARE IF:  You have knee pain that is continual and does not   seem to be getting better.  SEEK IMMEDIATE MEDICAL CARE IF:  Your knee joint feels hot to the touch and you have a high fever. MAKE SURE YOU:   Understand these instructions.   Will watch your condition.   Will get help right away if you are not doing well or get worse.  Document Released: 04/15/2007 Document Revised: 06/07/2011 Document Reviewed: 04/15/2007 [employer] Patient Information 2012 [employer]. 

## 2011-11-30 NOTE — ED Provider Notes (Signed)
History     CSN: [id number]  Arrival date & time 11/30/11  1543   First MD Initiated Contact with Patient 11/30/11 1601      Chief Complaint  Patient presents with  . Knee Pain    (Consider location/radiation/quality/duration/timing/severity/associated sxs/prior treatment) HPI Comments: Patient c/o pain to both knees for several days.  States the pain began after playing basketball and twisted his knee.  Pain to the right knee is worse than the left.  States that his knee feels "like I have to remember how to walk each time I stand on it".  He denies fall.  States he has been wearing a knee brace and applying heat to his knee w/o improvement  Patient is a 46 y.o. male presenting with knee pain. The history is provided by the patient.  Knee Pain This is a new problem. The current episode started in the past 7 days. The problem occurs constantly. The problem has been unchanged. Associated symptoms include arthralgias. Pertinent negatives include no chills, fever, headaches, joint swelling, myalgias, neck pain, numbness, vomiting or weakness. The symptoms are aggravated by walking, standing and twisting. He has tried nothing for the symptoms. The treatment provided no relief.    Past Medical History  Diagnosis Date  . Chronic back pain   . Acid reflux   . Arthritis   . Eczema     Past Surgical History  Procedure Date  . Arm surgery     Family History  Problem Relation Age of Onset  . Diabetes Mother   . Heart attack Mother   . Heart attack Father   . Cancer Sister   . Diabetes Brother     History  Substance Use Topics  . Smoking status: Former Smoker -- 2.0 packs/day for 30 years    Types: Cigarettes    Quit date: 09/26/2011  . Smokeless tobacco: Not on file  . Alcohol Use: No      Review of Systems  Constitutional: Negative for fever and chills.  HENT: Negative for neck pain.   Gastrointestinal: Negative for vomiting.  Musculoskeletal: Positive for arthralgias  and gait problem. Negative for myalgias and joint swelling.  Skin: Negative for color change and wound.  Neurological: Negative for weakness, numbness and headaches.  All other systems reviewed and are negative.    Allergies  Fish allergy; Tomato; and Vicodin  Home Medications   Current Outpatient Rx  Name Route Sig Dispense Refill  . HYDROCODONE-ACETAMINOPHEN 7.5-500 MG PO TABS Oral Take 1 tablet by mouth every 6 (six) hours as needed.    Marland Kitchen OMEPRAZOLE 20 MG PO CPDR Oral Take 1 capsule (20 mg total) by mouth daily. 30 capsule 0    BP 141/90  Pulse 77  Temp(Src) 98.6 F (37 C) (Oral)  Resp 20  Ht 5\' 8"  (1.727 m)  Wt 260 lb (117.935 kg)  BMI 39.53 kg/m2  SpO2 96%  Physical Exam  Nursing note and vitals reviewed. Constitutional: He is oriented to person, place, and time. He appears well-developed and well-nourished. No distress.  Cardiovascular: Normal rate, regular rhythm, normal heart sounds and intact distal pulses.   Pulmonary/Chest: Effort normal and breath sounds normal.  Musculoskeletal: He exhibits tenderness.       ttp of the bilateral knees, Right > Left.  No significant effusion.  ROM is preserved.  Pain is reproduced with flexion.  No erythema, bruising or deformity.    Neurological: He is alert and oriented to person, place, and time. He exhibits  normal muscle tone. Coordination normal.  Skin: Skin is warm and dry. No erythema.    ED Course  Procedures (including critical care time)   Dg Knee Complete 4 Views Right  11/30/2011  *RADIOLOGY REPORT*  Clinical Data: Sports injury.  Pain.  RIGHT KNEE - COMPLETE 4+ VIEW  Comparison: None.  Findings: There is a small amount of joint fluid.  No evidence of acute fracture.  There is a healed fracture of the proximal fibular diaphysis.  There is mild osteoarthritis of the knee with mild medial and lateral joint space narrowing and small marginal osteophytes.  IMPRESSION: No acute finding other than a small amount of joint  fluid.  Mild degenerative change of the knee.  Healed fibular fracture.  Original Report Authenticated By: Thomasenia Sales, M.D.       MDM     Previous medical charts, nursing notes and vitals signs from this visit were reviewed by me   All laboratory results and/or imaging results performed on this visit, if applicable, were reviewed by me and discussed with the patient and/or parent as well as recommendation for follow-up    MEDICATIONS GIVEN IN ED:  none     PRESCRIPTIONS GIVEN AT DISCHARGE:  norco #20. mobic     Pt stable in ED with no significant deterioration in condition. Pt feels improved after observation and/or treatment in ED. Patient / Family / Caregiver understand and agree with initial ED impression and plan with expectations set for ED visit.  Patient agrees to return to ED for any worsening symptoms       Geanna Divirgilio L. [place], [place] 11/30/11 1717

## 2011-11-30 NOTE — ED Notes (Signed)
Injury to both knees on Sunday when fell playing basketball, Rt leg is worse.

## 2011-11-30 NOTE — ED Notes (Signed)
Pt presents with bilateral knee pain after attempting to play basketball (trying to loose weight). Pt reports putting a heating pad on knees without relief. Pt states knees are still sore.

## 2011-12-04 DIAGNOSIS — L259 Unspecified contact dermatitis, unspecified cause: Secondary | ICD-10-CM | POA: Diagnosis not present

## 2011-12-04 NOTE — ED Provider Notes (Signed)
Medical screening examination/treatment/procedure(s) were performed by non-physician practitioner and as supervising physician I was immediately available for consultation/collaboration.  Shelda Jakes, MD 12/04/11 1037

## 2011-12-24 ENCOUNTER — Telehealth: Payer: Self-pay | Admitting: Pulmonary Disease

## 2011-12-24 DIAGNOSIS — G4733 Obstructive sleep apnea (adult) (pediatric): Secondary | ICD-10-CM

## 2011-12-24 NOTE — Telephone Encounter (Signed)
His current cpap is not at a very high pressure.  He actually needed 13 at last sleep study, and his sleep apnea has likely worsened since (requiring even higher pressure).  I can turn down to 8, but anything less than this would be trivial.  I would hope over time to slowly increase back up.  Ok to send order for 8.

## 2011-12-24 NOTE — Telephone Encounter (Signed)
I spoke with pt and he states his cpap machine setting is to high. It's too much air blowing from the machine causing him not to be able to breathe. Pt currently set at 10 cm. Pt requesting his setting to be lowered. Please advise Dr. Shelle Iron thanks

## 2011-12-24 NOTE — Telephone Encounter (Signed)
Pt aware of [facility]. ORDER HAS been sent

## 2012-01-04 DIAGNOSIS — M25569 Pain in unspecified knee: Secondary | ICD-10-CM | POA: Diagnosis not present

## 2012-01-04 DIAGNOSIS — G4730 Sleep apnea, unspecified: Secondary | ICD-10-CM | POA: Diagnosis not present

## 2012-01-04 DIAGNOSIS — M545 Low back pain: Secondary | ICD-10-CM | POA: Diagnosis not present

## 2012-01-08 ENCOUNTER — Encounter: Payer: Self-pay | Admitting: Pulmonary Disease

## 2012-01-08 ENCOUNTER — Ambulatory Visit (INDEPENDENT_AMBULATORY_CARE_PROVIDER_SITE_OTHER): Payer: Medicare Other | Admitting: Pulmonary Disease

## 2012-01-08 VITALS — BP 134/84 | HR 74 | Temp 98.9°F | Ht 68.0 in | Wt 265.2 lb

## 2012-01-08 DIAGNOSIS — G4733 Obstructive sleep apnea (adult) (pediatric): Secondary | ICD-10-CM | POA: Diagnosis not present

## 2012-01-08 NOTE — Progress Notes (Signed)
  Subjective:    Patient ID: Terry Case, male    DOB: 1965/10/10, 46 y.o.   MRN: [id number]  HPI The patient comes in today for followup of his obstructive sleep apnea.  He was started on CPAP at 10 cm, but was unable to tolerate this and asked that it be decreased to 8 cm.  He has been doing much better on this pressure, and his wife has not heard breakthrough snoring.  He is having no issues with his mask fit, but is getting too much moisture in his system.  His wife thinks the CPAP has helped him considerably, although the patient is unsure.   Review of Systems  Constitutional: Negative for fever and unexpected weight change.  HENT: Negative for ear pain, nosebleeds, congestion, sore throat, rhinorrhea, sneezing, trouble swallowing, dental problem, postnasal drip and sinus pressure.   Eyes: Negative for redness and itching.  Respiratory: Negative for cough, chest tightness, shortness of breath and wheezing.   Cardiovascular: Negative for palpitations and leg swelling.  Gastrointestinal: Negative for nausea and vomiting.  Genitourinary: Negative for dysuria.  Musculoskeletal: Positive for arthralgias. Negative for joint swelling.  Skin: Negative for rash.  Neurological: Negative for headaches.  Hematological: Does not bruise/bleed easily.  Psychiatric/Behavioral: Negative for dysphoric mood. The patient is not nervous/anxious.   All other systems reviewed and are negative.       Objective:   Physical Exam Obese male in no acute distress No skin breakdown or pressure necrosis from the CPAP mask Lower extremities with no significant edema, no cyanosis Alert, but mildly sleepy, moves all 4 extremities.       Assessment & Plan:

## 2012-01-08 NOTE — Assessment & Plan Note (Signed)
The patient is wearing CPAP compliantly, but was unable to tolerate the pressure of 10 cm.  This was leading to awakenings and also insomnia.  His machine has decreased to 8 cm, and he has done much better on this.  I think he would benefit from being on the automatic setting, and we'll try him on this for the next 4 weeks.  If he has intolerance, I would go back to his 8 cm, an increase to 10 and then 12 q.2 weeks.  I've also encouraged him to work aggressively on weight loss.

## 2012-01-08 NOTE — Patient Instructions (Addendum)
Will have your machine put on automatic for the next 4 weeks.  Will have them get a download off your machine for me to review.  If you don't do well with this, let me know, and we can go back to our original plan Work on weight loss Adjust humidifier down if you are getting too much moisture followup with me in 6mos if doing well.

## 2012-02-25 ENCOUNTER — Ambulatory Visit: Payer: Medicare Other | Admitting: Pulmonary Disease

## 2012-03-01 ENCOUNTER — Other Ambulatory Visit: Payer: Self-pay | Admitting: Pulmonary Disease

## 2012-03-01 DIAGNOSIS — G4733 Obstructive sleep apnea (adult) (pediatric): Secondary | ICD-10-CM

## 2012-03-11 ENCOUNTER — Ambulatory Visit: Payer: Medicare Other | Admitting: Pulmonary Disease

## 2012-03-17 DIAGNOSIS — E669 Obesity, unspecified: Secondary | ICD-10-CM | POA: Diagnosis not present

## 2012-03-17 DIAGNOSIS — M25569 Pain in unspecified knee: Secondary | ICD-10-CM | POA: Diagnosis not present

## 2012-03-18 ENCOUNTER — Other Ambulatory Visit (HOSPITAL_COMMUNITY): Payer: Self-pay | Admitting: Family Medicine

## 2012-03-18 DIAGNOSIS — M25569 Pain in unspecified knee: Secondary | ICD-10-CM

## 2012-03-19 ENCOUNTER — Ambulatory Visit (HOSPITAL_COMMUNITY)
Admission: RE | Admit: 2012-03-19 | Discharge: 2012-03-19 | Disposition: A | Discharge status: Home / Self Care | Payer: Medicare Other | Source: Ambulatory Visit | Attending: Family Medicine | Admitting: Family Medicine

## 2012-03-19 DIAGNOSIS — Z043 Encounter for examination and observation following other accident: Secondary | ICD-10-CM | POA: Diagnosis not present

## 2012-03-19 DIAGNOSIS — M25569 Pain in unspecified knee: Secondary | ICD-10-CM | POA: Insufficient documentation

## 2012-03-19 DIAGNOSIS — M23329 Other meniscus derangements, posterior horn of medial meniscus, unspecified knee: Secondary | ICD-10-CM | POA: Diagnosis not present

## 2012-03-19 DIAGNOSIS — IMO0002 Reserved for concepts with insufficient information to code with codable children: Secondary | ICD-10-CM | POA: Diagnosis not present

## 2012-04-16 ENCOUNTER — Encounter: Payer: Self-pay | Admitting: Orthopedic Surgery

## 2012-04-16 ENCOUNTER — Ambulatory Visit (INDEPENDENT_AMBULATORY_CARE_PROVIDER_SITE_OTHER): Payer: Medicare Other | Admitting: Orthopedic Surgery

## 2012-04-16 ENCOUNTER — Other Ambulatory Visit: Payer: Self-pay | Admitting: *Deleted

## 2012-04-16 ENCOUNTER — Encounter (HOSPITAL_COMMUNITY): Payer: Self-pay

## 2012-04-16 VITALS — BP 130/70 | Ht 68.0 in | Wt 272.0 lb

## 2012-04-16 DIAGNOSIS — IMO0002 Reserved for concepts with insufficient information to code with codable children: Secondary | ICD-10-CM

## 2012-04-16 DIAGNOSIS — M1710 Unilateral primary osteoarthritis, unspecified knee: Secondary | ICD-10-CM | POA: Diagnosis not present

## 2012-04-16 DIAGNOSIS — S83242A Other tear of medial meniscus, current injury, left knee, initial encounter: Secondary | ICD-10-CM | POA: Insufficient documentation

## 2012-04-16 NOTE — Progress Notes (Signed)
Patient ID: Terry Case, male DOB: 06/05/1966, 46 y.o. MRN: [id number]  Chief Complaint   Patient presents with   .  Knee Injury     Right knee injury 01/19/12   This is a 46-year-old male who is referred to us by Dr. Hill who presents with pain in his right knee after twisting his knee playing basketball. He does not the names for work. He took some ibuprofen his pain didn't improve he complains of sharp dull throbbing stabbing burning 9/10 constant pain in the front of his knee associated with swelling as well as some tingling in the back of his right leg. He complains actually of bilateral knee pain which is arthritic in nature. His right knee has been hurting for 2 months since the injury in July.  His review of systems is notable for weight gain shortness of breath and wheezing some skin itching and tingling in his limbs he also has some back pain  His other review of systems are negative  Past Medical History   Diagnosis  Date   .  Chronic back pain    .  Acid reflux    .  Arthritis    .  Eczema     Past Surgical History   Procedure  Date   .  Arm surgery      left   .  Finger surgery      right index   .  Cystectomy      head   .  Cystectomy      upper left arm    Current Outpatient Prescriptions on File Prior to Visit   Medication  Sig  Dispense  Refill   .  aspirin EC 81 MG tablet  Take 81 mg by mouth every morning.     .  diphenhydrAMINE (BENADRYL) 25 mg capsule  Take 50 mg by mouth every morning. For allergies     .  HYDROcodone-acetaminophen (LORTAB) 7.5-500 MG per tablet  Take 1 tablet by mouth every 6 (six) hours as needed.     .  hydrOXYzine (ATARAX/VISTARIL) 10 MG tablet  Take 10-20 mg by mouth at bedtime as needed. For itching     .  ibuprofen (ADVIL,MOTRIN) 200 MG tablet  Take 600 mg by mouth as needed. For pain     .  Naproxen-Esomeprazole (VIMOVO) 500-20 MG TBEC  Take 1 tablet by mouth daily.     .  DISCONTD: famotidine (PEPCID) 20 MG tablet  Take 1 tablet  (20 mg total) by mouth 2 (two) times daily.  60 tablet  0   Mr. Dentler had no medications administered during this visit.  History   Substance Use Topics   .  Smoking status:  Former Smoker -- 2.0 packs/day for 30 years     Types:  Cigarettes     Quit date:  09/26/2011   .  Smokeless tobacco:  Not on file   .  Alcohol Use:  No   Physical Exam  Nursing note and vitals reviewed.  Constitutional: He is oriented to person, place, and time. He appears well-developed and well-nourished. No distress.  Neck: Normal range of motion.  Pulmonary/Chest: Effort normal. No respiratory distress.  Abdominal: He exhibits no distension.  Musculoskeletal:  Upper extremity exam  Inspection and palpation revealed no abnormalities in the upper extremities. Range of motion is full without contracture.  Motor exam is normal with grade 5 strength.  The joints are fully reduced without subluxation.  There is   no atrophy or tremor and muscle tone is normal. All joints are stable.    The right knee is tender over the medial joint line and has a fairly large joint effusion. He is ambulating with no significant limp. He has decreased range of motion in the right knee although the ligaments are stable and the muscle strength is grade 5. Skin is intact. Meniscal signs are positive such as McMurray's sign and screw home test  His lower extremities show normal pulse and temperature has no lymphadenopathy sensation is intact deep tendon reflexes are normal coordination and balance remain normal  Left knee normal   Lymphadenopathy:  He has no cervical adenopathy.  Neurological: He is alert and oriented to person, place, and time. He has normal reflexes. He displays normal reflexes. No cranial nerve deficit. He exhibits normal muscle tone. Coordination normal.  Skin: Skin is warm. No rash noted. He is not diaphoretic. No erythema. No pallor.  Psychiatric: He has a normal mood and affect. His behavior is normal.  Judgment and thought content normal.   He presents with x-rays and MRI which showed osteoarthritis and medial meniscal tear  He also has a meniscal cyst   We advise arthroscopic surgery to address his medial meniscal tear with the understanding that he will have residual pain from his arthritis  He agrees to this he understands he'll have to take it easy for 3-4 weeks   Plan arthroscopy right knee partial medial meniscectomy    

## 2012-04-16 NOTE — Patient Instructions (Signed)
Right knee arthroscopy on Oct 25th   Arthroscopy Arthroscopy is a procedure in which a caregiver uses an arthroscope. An arthroscope is an instrument that allows your caregiver to look directly into a joint. It is like a small telescope attached to a video camera, and is similar in size to a pencil. Arthroscopes let your caregiver see inside your joint on an attached television monitor. Most joints in the human body can be examined and surgery can be performed through the arthroscope using small incisions. Prior to the use of arthroscopes, surgeries were done with larger open incisions, which requires longer recovery times. On occasion, arthroscopic procedures result in complications such as bleeding, swelling and pain. If a complication results, a longer recovery and rehabilitation may be required. INDICATIONS Arthroscopic procedures were developed to remove, repair, or replace (reconstruct) damaged tissue. Arthroscopy can be preformed if the procedure involves trimming tissue, removing fragments of cartilage or bone (loose bodies) within joints, suctioning debris, biopsy of tissue, smoothing rough surfaces, removing inflamed tissue, shrinking tissue, or sewing (suturing), tacking, or stapling cartilage and ligaments. What can be done is dependent on many factors. Arthroscopy allows for surgeons to perform certain surgical procedures. Most of the surgeries you can go home the same day as the procedure (outpatient procedures) because the procedure does not cause as much trauma to the patient. Arthroscopy is a valuable diagnostic tool. Radiographs (such as x-ray and CT scans) have poor ability at showing soft tissue, whereas arthroscopy gives the caregiver direct visualization of soft tissue, cartilege, and bone. However, the emergence of magnetic resonance imaging (MRI) has lessened the need for arthroscopy as a diagnostic tool.   TECHNIQUE  Repair and reconstruction arthroscopic techniques may require  additional and/or larger incisions than diagnostic arthroscopy portals (1/4 inch incisions). The procedures are often more extensive in repair and reconstruction, than excision procedures. Therefore, the patient may need to stay in the hospital overnight after arthroscopic repair or reconstruction. These procedures also disrupt more tissue, and discomfort may occur, so the temporary use of braces, casts, or crutches, as well as rehabilitation, may be needed.   In order to undergo an arthroscopic procedure, a complete evaluation is necessary in order to provide the caregiver with as accurate of a diagnosis as possible. Sometimes it is necessary to perform diagnostic arthroscopy before another surgery can be scheduled.   Both diagnostic and surgical arthroscopy can be performed under local anesthesia (only the joint is numbed), regional anesthesia (the operative limb is numbed), spinal or epidural anesthesia (only the lower extremities are numbed), or general anesthesia (you are completely asleep). The type of anesthetic is dependent on the patient, the surgeon, and the procedure being performed.   If you ask prior to the operation, you may be able to obtain pictures or a video from the arthroscopic camera.   Do not eat or drink anything for at least 8 hours before surgery. Food and drinks (including coffee) make general anesthesia more hazardous.  SEEK MEDICAL CARE IF:  You experience pain, numbness, or coldness in the extremity operated on.   Blue, gray, or dark color appears in the fingers or toenails.   You have increased pain, swelling, redness, drainage, or bleeding in the surgical area despite rest, ice, elevation, and pain medications.   You have signs of infection, including a fever 102 F (38.9 C) or higher.  Document Released: 01/17/2005 Document Revised: 09/10/2011 Document Reviewed: 09/30/2008 [facility] Patient Information 2013 [place], [place].   Arthroscopic Procedure, Knee An  arthroscopic  procedure can find what is wrong with your knee. PROCEDURE Arthroscopy is a surgical technique that allows your orthopedic surgeon to diagnose and treat your knee injury with accuracy. They will look into your knee through a small instrument. This is almost like a small (pencil sized) telescope. Because arthroscopy affects your knee less than open knee surgery, you can anticipate a more rapid recovery. Taking an active role by following your caregiver's instructions will help with rapid and complete recovery. Use crutches, rest, elevation, ice, and knee exercises as instructed. The length of recovery depends on various factors including type of injury, age, physical condition, medical conditions, and your rehabilitation. Your knee is the joint between the large bones (femur and tibia) in your leg. Cartilage covers these bone ends which are smooth and slippery and allow your knee to bend and move smoothly. Two menisci, thick, semi-lunar shaped pads of cartilage which form a rim inside the joint, help absorb shock and stabilize your knee. Ligaments bind the bones together and support your knee joint. Muscles move the joint, help support your knee, and take stress off the joint itself. Because of this all programs and physical therapy to rehabilitate an injured or repaired knee require rebuilding and strengthening your muscles. AFTER THE PROCEDURE  After the procedure, you will be moved to a recovery area until most of the effects of the medication have worn off. Your caregiver will discuss the test results with you.   Only take over-the-counter or prescription medicines for pain, discomfort, or fever as directed by your caregiver.  SEEK MEDICAL CARE IF:    You have increased bleeding from your wounds.   You see redness, swelling, or have increasing pain in your wounds.   You have pus coming from your wound.   You have an oral temperature above 102 F (38.9 C).   You notice a bad smell  coming from the wound or dressing.   You have severe pain with any motion of your knee.  SEEK IMMEDIATE MEDICAL CARE IF:    You develop a rash.   You have difficulty breathing.   You have any allergic problems.  Document Released: 06/15/2000 Document Revised: 09/10/2011 Document Reviewed: 01/07/2008 [facility] Patient Information 2013 [place], [place].

## 2012-04-18 ENCOUNTER — Encounter (HOSPITAL_COMMUNITY): Payer: Self-pay

## 2012-04-18 ENCOUNTER — Encounter (HOSPITAL_COMMUNITY)
Admission: RE | Admit: 2012-04-18 | Discharge: 2012-04-18 | Disposition: A | Discharge status: Home / Self Care | Payer: Medicare Other | Source: Ambulatory Visit | Attending: Orthopedic Surgery | Admitting: Orthopedic Surgery

## 2012-04-18 DIAGNOSIS — IMO0002 Reserved for concepts with insufficient information to code with codable children: Secondary | ICD-10-CM | POA: Diagnosis not present

## 2012-04-18 DIAGNOSIS — Z01812 Encounter for preprocedural laboratory examination: Secondary | ICD-10-CM | POA: Diagnosis not present

## 2012-04-18 DIAGNOSIS — J449 Chronic obstructive pulmonary disease, unspecified: Secondary | ICD-10-CM | POA: Diagnosis not present

## 2012-04-18 HISTORY — DX: Unspecified asthma, uncomplicated: J45.909

## 2012-04-18 LAB — BASIC METABOLIC PANEL
BUN: 15 mg/dL (ref 6–23)
Calcium: 9.9 mg/dL (ref 8.4–10.5)
Creatinine, Ser: 1.08 mg/dL (ref 0.50–1.35)
GFR calc Af Amer: >90 mL/min (ref >90)
GFR calc non Af Amer: 79 mL/min — ABNORMAL LOW (ref >90)

## 2012-04-18 LAB — SURGICAL PCR SCREEN
MRSA, PCR: NEGATIVE
Staphylococcus aureus: NEGATIVE

## 2012-04-18 LAB — HEMOGLOBIN AND HEMATOCRIT, BLOOD: Hemoglobin: 14.3 g/dL (ref 13.0–17.0)

## 2012-04-18 NOTE — Patient Instructions (Signed)
20 Terry Case  04/18/2012   Your procedure is scheduled on:  Friday, 04/25/12  Report to [facility] at 0830 AM.  Call this number if you have problems the morning of surgery: [phone]   Remember:   Do not eat food:After Midnight.  May have clear liquids:until Midnight .  Clear liquids include soda, tea, black coffee, apple or grape juice, broth.  Take these medicines the morning of surgery with A SIP OF WATER: lortab, prilosec   Do not wear jewelry, make-up or nail polish.  Do not wear lotions, powders, or perfumes. You may wear deodorant.  Do not shave 48 hours prior to surgery. Men may shave face and neck.  Do not bring valuables to the hospital.  Contacts, dentures or bridgework may not be worn into surgery.  Leave suitcase in the car. After surgery it may be brought to your room.  For patients admitted to the hospital, checkout time is 11:00 AM the day of discharge.   Patients discharged the day of surgery will not be allowed to drive home.  Name and phone number of your driver: fiance  Special Instructions: Shower using CHG 2 nights before surgery and the night before surgery.  If you shower the day of surgery use CHG.  Use special wash - you have one bottle of CHG for all showers.  You should use approximately 1/3 of the bottle for each shower.   Please read over the following fact sheets that you were given: Pain Booklet, MRSA Information, Surgical Site Infection Prevention, Anesthesia Post-op Instructions and Care and Recovery After Surgery  Arthroscopic Procedure, Knee An arthroscopic procedure can find what is wrong with your knee. PROCEDURE Arthroscopy is a surgical technique that allows your orthopedic surgeon to diagnose and treat your knee injury with accuracy. They will look into your knee through a small instrument. This is almost like a small (pencil sized) telescope. Because arthroscopy affects your knee less than open knee surgery, you can anticipate a more  rapid recovery. Taking an active role by following your caregiver's instructions will help with rapid and complete recovery. Use crutches, rest, elevation, ice, and knee exercises as instructed. The length of recovery depends on various factors including type of injury, age, physical condition, medical conditions, and your rehabilitation. Your knee is the joint between the large bones (femur and tibia) in your leg. Cartilage covers these bone ends which are smooth and slippery and allow your knee to bend and move smoothly. Two menisci, thick, semi-lunar shaped pads of cartilage which form a rim inside the joint, help absorb shock and stabilize your knee. Ligaments bind the bones together and support your knee joint. Muscles move the joint, help support your knee, and take stress off the joint itself. Because of this all programs and physical therapy to rehabilitate an injured or repaired knee require rebuilding and strengthening your muscles. AFTER THE PROCEDURE  After the procedure, you will be moved to a recovery area until most of the effects of the medication have worn off. Your caregiver will discuss the test results with you.  Only take over-the-counter or prescription medicines for pain, discomfort, or fever as directed by your caregiver. SEEK MEDICAL CARE IF:   You have increased bleeding from your wounds.  You see redness, swelling, or have increasing pain in your wounds.  You have pus coming from your wound.  You have an oral temperature above 102 F (38.9 C).  You notice a bad smell coming from the wound  or dressing.  You have severe pain with any motion of your knee. SEEK IMMEDIATE MEDICAL CARE IF:   You develop a rash.  You have difficulty breathing.  You have any allergic problems. Document Released: 06/15/2000 Document Revised: 09/10/2011 Document Reviewed: 01/07/2008 [facility] Patient Information 2013 [place], [place].

## 2012-04-23 ENCOUNTER — Telehealth: Payer: Self-pay | Admitting: Orthopedic Surgery

## 2012-04-23 NOTE — Telephone Encounter (Signed)
Per primary insurer guidelines, Medicare, no pre-authorization required for surgery, CPT 29881, 29880, ICD9 830.0, 715.96, scheduled at [facility], out-patient, for 04/25/12. Per secondary insurer, Medicaid, no pre-authorization is required for surgery, per online [place] [employer] system.

## 2012-04-24 NOTE — H&P (Signed)
Patient ID: Terry Case, male DOB: Sep 06, 1965, 46 y.o. MRN: [id number]  Chief Complaint   Patient presents with   .  Knee Injury     Right knee injury 01/19/12   This is a 46 year old male who is referred to [place] by Dr. Loleta Chance who presents with pain in his right knee after twisting his knee playing basketball. He does not the names for work. He took some ibuprofen his pain didn't improve he complains of sharp dull throbbing stabbing burning 9/10 constant pain in the front of his knee associated with swelling as well as some tingling in the back of his right leg. He complains actually of bilateral knee pain which is arthritic in nature. His right knee has been hurting for 2 months since the injury in July.  His review of systems is notable for weight gain shortness of breath and wheezing some skin itching and tingling in his limbs he also has some back pain  His other review of systems are negative  Past Medical History   Diagnosis  Date   .  Chronic back pain    .  Acid reflux    .  Arthritis    .  Eczema     Past Surgical History   Procedure  Date   .  Arm surgery      left   .  Finger surgery      right index   .  Cystectomy      head   .  Cystectomy      upper left arm    Current Outpatient Prescriptions on File Prior to Visit   Medication  Sig  Dispense  Refill   .  aspirin EC 81 MG tablet  Take 81 mg by mouth every morning.     .  diphenhydrAMINE (BENADRYL) 25 mg capsule  Take 50 mg by mouth every morning. For allergies     .  HYDROcodone-acetaminophen (LORTAB) 7.5-500 MG per tablet  Take 1 tablet by mouth every 6 (six) hours as needed.     .  hydrOXYzine (ATARAX/VISTARIL) 10 MG tablet  Take 10-20 mg by mouth at bedtime as needed. For itching     .  ibuprofen (ADVIL,MOTRIN) 200 MG tablet  Take 600 mg by mouth as needed. For pain     .  Naproxen-Esomeprazole (VIMOVO) 500-20 MG TBEC  Take 1 tablet by mouth daily.     Marland Kitchen  DISCONTD: famotidine (PEPCID) 20 MG tablet  Take 1 tablet  (20 mg total) by mouth 2 (two) times daily.  60 tablet  0   Terry Case had no medications administered during this visit.  History   Substance Use Topics   .  Smoking status:  Former Smoker -- 2.0 packs/day for 30 years     Types:  Cigarettes     Quit date:  09/26/2011   .  Smokeless tobacco:  Not on file   .  Alcohol Use:  No   Physical Exam  Nursing note and vitals reviewed.  Constitutional: He is oriented to person, place, and time. He appears well-developed and well-nourished. No distress.  Neck: Normal range of motion.  Pulmonary/Chest: Effort normal. No respiratory distress.  Abdominal: He exhibits no distension.  Musculoskeletal:  Upper extremity exam  Inspection and palpation revealed no abnormalities in the upper extremities. Range of motion is full without contracture.  Motor exam is normal with grade 5 strength.  The joints are fully reduced without subluxation.  There is  no atrophy or tremor and muscle tone is normal. All joints are stable.    The right knee is tender over the medial joint line and has a fairly large joint effusion. He is ambulating with no significant limp. He has decreased range of motion in the right knee although the ligaments are stable and the muscle strength is grade 5. Skin is intact. Meniscal signs are positive such as McMurray's sign and screw home test  His lower extremities show normal pulse and temperature has no lymphadenopathy sensation is intact deep tendon reflexes are normal coordination and balance remain normal  Left knee normal   Lymphadenopathy:  He has no cervical adenopathy.  Neurological: He is alert and oriented to person, place, and time. He has normal reflexes. He displays normal reflexes. No cranial nerve deficit. He exhibits normal muscle tone. Coordination normal.  Skin: Skin is warm. No rash noted. He is not diaphoretic. No erythema. No pallor.  Psychiatric: He has a normal mood and affect. His behavior is normal.  Judgment and thought content normal.   He presents with x-rays and MRI which showed osteoarthritis and medial meniscal tear  He also has a meniscal cyst   We advise arthroscopic surgery to address his medial meniscal tear with the understanding that he will have residual pain from his arthritis  He agrees to this he understands he'll have to take it easy for 3-4 weeks   Plan arthroscopy right knee partial medial meniscectomy

## 2012-04-25 ENCOUNTER — Ambulatory Visit (HOSPITAL_COMMUNITY): Payer: Medicare Other | Admitting: Anesthesiology

## 2012-04-25 ENCOUNTER — Encounter (HOSPITAL_COMMUNITY): Payer: Self-pay | Admitting: Anesthesiology

## 2012-04-25 ENCOUNTER — Encounter (HOSPITAL_COMMUNITY)
Admission: RE | Disposition: A | Discharge status: Home / Self Care | Payer: Self-pay | Source: Ambulatory Visit | Attending: Orthopedic Surgery

## 2012-04-25 ENCOUNTER — Ambulatory Visit (HOSPITAL_COMMUNITY)
Admission: RE | Admit: 2012-04-25 | Discharge: 2012-04-25 | Disposition: A | Discharge status: Home / Self Care | Payer: Medicare Other | Source: Ambulatory Visit | Attending: Orthopedic Surgery | Admitting: Orthopedic Surgery

## 2012-04-25 ENCOUNTER — Encounter (HOSPITAL_COMMUNITY): Payer: Self-pay

## 2012-04-25 DIAGNOSIS — Y9367 Activity, basketball: Secondary | ICD-10-CM | POA: Insufficient documentation

## 2012-04-25 DIAGNOSIS — M1710 Unilateral primary osteoarthritis, unspecified knee: Secondary | ICD-10-CM | POA: Diagnosis not present

## 2012-04-25 DIAGNOSIS — Z01812 Encounter for preprocedural laboratory examination: Secondary | ICD-10-CM | POA: Insufficient documentation

## 2012-04-25 DIAGNOSIS — IMO0002 Reserved for concepts with insufficient information to code with codable children: Secondary | ICD-10-CM | POA: Diagnosis not present

## 2012-04-25 DIAGNOSIS — X500XXA Overexertion from strenuous movement or load, initial encounter: Secondary | ICD-10-CM | POA: Insufficient documentation

## 2012-04-25 DIAGNOSIS — J449 Chronic obstructive pulmonary disease, unspecified: Secondary | ICD-10-CM | POA: Diagnosis not present

## 2012-04-25 DIAGNOSIS — S83242A Other tear of medial meniscus, current injury, left knee, initial encounter: Secondary | ICD-10-CM

## 2012-04-25 DIAGNOSIS — J4489 Other specified chronic obstructive pulmonary disease: Secondary | ICD-10-CM | POA: Insufficient documentation

## 2012-04-25 SURGERY — ARTHROSCOPY, KNEE, WITH MEDIAL MENISCECTOMY
Anesthesia: General | Site: Knee | Laterality: Right | Wound class: Clean

## 2012-04-25 MED ORDER — SUCCINYLCHOLINE CHLORIDE 20 MG/ML IJ SOLN
INTRAMUSCULAR | Status: AC
  Filled 2012-04-25: qty 1

## 2012-04-25 MED ORDER — HYDROMORPHONE HCL 2 MG PO TABS
2.0000 mg | ORAL_TABLET | Freq: Once | ORAL | Status: AC
  Administered 2012-04-25: 2 mg via ORAL

## 2012-04-25 MED ORDER — HYDROMORPHONE HCL 4 MG PO TABS
4.0000 mg | ORAL_TABLET | ORAL | Status: DC | PRN
Start: 2012-04-25 — End: 2012-08-01

## 2012-04-25 MED ORDER — NALOXONE HCL 0.4 MG/ML IJ SOLN
INTRAMUSCULAR | Status: AC
Start: 2012-04-25 — End: 2012-04-25
  Filled 2012-04-25: qty 1

## 2012-04-25 MED ORDER — CHLORHEXIDINE GLUCONATE 4 % EX LIQD: 60.0000 mL | Freq: Once | CUTANEOUS | Status: DC

## 2012-04-25 MED ORDER — ACETAMINOPHEN 500 MG PO TABS
500.0000 mg | ORAL_TABLET | Freq: Once | ORAL | Status: AC
  Administered 2012-04-25: 500 mg via ORAL

## 2012-04-25 MED ORDER — DIPHENHYDRAMINE HCL 25 MG PO CAPS
25.0000 mg | ORAL_CAPSULE | Freq: Once | ORAL | Status: AC
  Administered 2012-04-25: 25 mg via ORAL

## 2012-04-25 MED ORDER — KETOROLAC TROMETHAMINE 30 MG/ML IJ SOLN
INTRAMUSCULAR | Status: AC
  Filled 2012-04-25: qty 1

## 2012-04-25 MED ORDER — FENTANYL CITRATE 0.05 MG/ML IJ SOLN
INTRAMUSCULAR | Status: DC | PRN
  Administered 2012-04-25: 25 ug via INTRAVENOUS
  Administered 2012-04-25: 50 ug via INTRAVENOUS
  Administered 2012-04-25: 100 ug via INTRAVENOUS
  Administered 2012-04-25: 25 ug via INTRAVENOUS

## 2012-04-25 MED ORDER — PROMETHAZINE HCL 12.5 MG PO TABS: 12.5000 mg | ORAL_TABLET | Freq: Four times a day (QID) | ORAL | Status: DC | PRN

## 2012-04-25 MED ORDER — EPINEPHRINE HCL 1 MG/ML IJ SOLN
INTRAMUSCULAR | Status: AC
  Filled 2012-04-25: qty 6

## 2012-04-25 MED ORDER — CEFAZOLIN SODIUM 1-5 GM-% IV SOLN
1.0000 g | INTRAVENOUS | Status: AC
  Administered 2012-04-25: 1 g via INTRAVENOUS

## 2012-04-25 MED ORDER — GLYCOPYRROLATE 0.2 MG/ML IJ SOLN
0.2000 mg | Freq: Once | INTRAMUSCULAR | Status: AC
  Administered 2012-04-25: 0.2 mg via INTRAVENOUS

## 2012-04-25 MED ORDER — PROPOFOL 10 MG/ML IV EMUL
INTRAVENOUS | Status: AC
  Filled 2012-04-25: qty 20

## 2012-04-25 MED ORDER — GLYCOPYRROLATE 0.2 MG/ML IJ SOLN
INTRAMUSCULAR | Status: DC | PRN
Start: 2012-04-25 — End: 2012-04-25
  Administered 2012-04-25: 0.4 mg via INTRAVENOUS

## 2012-04-25 MED ORDER — MIDAZOLAM HCL 2 MG/2ML IJ SOLN
1.0000 mg | INTRAMUSCULAR | Status: DC | PRN
  Administered 2012-04-25: 2 mg via INTRAVENOUS

## 2012-04-25 MED ORDER — HYDROMORPHONE HCL 2 MG PO TABS
ORAL_TABLET | ORAL | Status: AC
  Filled 2012-04-25: qty 1

## 2012-04-25 MED ORDER — KETOROLAC TROMETHAMINE 30 MG/ML IJ SOLN
30.0000 mg | Freq: Once | INTRAMUSCULAR | Status: AC
  Administered 2012-04-25: 30 mg via INTRAVENOUS

## 2012-04-25 MED ORDER — MIDAZOLAM HCL 2 MG/2ML IJ SOLN
INTRAMUSCULAR | Status: AC
  Filled 2012-04-25: qty 2

## 2012-04-25 MED ORDER — LIDOCAINE HCL (CARDIAC) 10 MG/ML IV SOLN
INTRAVENOUS | Status: DC | PRN
  Administered 2012-04-25: 20 mg via INTRAVENOUS

## 2012-04-25 MED ORDER — CEFAZOLIN SODIUM 1-5 GM-% IV SOLN
INTRAVENOUS | Status: AC
  Filled 2012-04-25: qty 50

## 2012-04-25 MED ORDER — OXYCODONE HCL 5 MG PO TABS
ORAL_TABLET | ORAL | Status: AC
  Filled 2012-04-25: qty 1

## 2012-04-25 MED ORDER — ACETAMINOPHEN 10 MG/ML IV SOLN
INTRAVENOUS | Status: AC
  Filled 2012-04-25: qty 100

## 2012-04-25 MED ORDER — BUPIVACAINE-EPINEPHRINE PF 0.5-1:200000 % IJ SOLN
INTRAMUSCULAR | Status: AC
  Filled 2012-04-25: qty 20

## 2012-04-25 MED ORDER — CEFAZOLIN SODIUM-DEXTROSE 2-3 GM-% IV SOLR
INTRAVENOUS | Status: AC
  Filled 2012-04-25: qty 50

## 2012-04-25 MED ORDER — GLYCOPYRROLATE 0.2 MG/ML IJ SOLN
INTRAMUSCULAR | Status: AC
  Filled 2012-04-25: qty 2

## 2012-04-25 MED ORDER — PREGABALIN 50 MG PO CAPS
ORAL_CAPSULE | ORAL | Status: AC
  Filled 2012-04-25: qty 1

## 2012-04-25 MED ORDER — ROCURONIUM BROMIDE 50 MG/5ML IV SOLN
INTRAVENOUS | Status: AC
  Filled 2012-04-25: qty 1

## 2012-04-25 MED ORDER — ONDANSETRON HCL 4 MG/2ML IJ SOLN
4.0000 mg | Freq: Once | INTRAMUSCULAR | Status: AC
  Administered 2012-04-25: 4 mg via INTRAVENOUS

## 2012-04-25 MED ORDER — BUPIVACAINE-EPINEPHRINE PF 0.5-1:200000 % IJ SOLN
INTRAMUSCULAR | Status: DC | PRN
  Administered 2012-04-25: 60 mL

## 2012-04-25 MED ORDER — SODIUM CHLORIDE 0.9 % IR SOLN
Status: DC | PRN
  Administered 2012-04-25: 1000 mL

## 2012-04-25 MED ORDER — FENTANYL CITRATE 0.05 MG/ML IJ SOLN
INTRAMUSCULAR | Status: AC
  Filled 2012-04-25: qty 2

## 2012-04-25 MED ORDER — ONDANSETRON HCL 4 MG/2ML IJ SOLN
INTRAMUSCULAR | Status: AC
  Filled 2012-04-25: qty 2

## 2012-04-25 MED ORDER — FENTANYL CITRATE 0.05 MG/ML IJ SOLN: 25.0000 ug | INTRAMUSCULAR | Status: DC | PRN

## 2012-04-25 MED ORDER — NALOXONE HCL 0.4 MG/ML IJ SOLN
INTRAMUSCULAR | Status: DC | PRN
  Administered 2012-04-25: 0.1 mg via INTRAVENOUS

## 2012-04-25 MED ORDER — CELECOXIB 100 MG PO CAPS
ORAL_CAPSULE | ORAL | Status: AC
  Filled 2012-04-25: qty 4

## 2012-04-25 MED ORDER — IBUPROFEN 800 MG PO TABS: 800.0000 mg | ORAL_TABLET | Freq: Three times a day (TID) | ORAL | Status: DC | PRN

## 2012-04-25 MED ORDER — SODIUM CHLORIDE 0.9 % IR SOLN
Status: DC | PRN
  Administered 2012-04-25 (×3)

## 2012-04-25 MED ORDER — DIPHENHYDRAMINE HCL 25 MG PO CAPS
ORAL_CAPSULE | ORAL | Status: AC
  Filled 2012-04-25: qty 1

## 2012-04-25 MED ORDER — LACTATED RINGERS IV SOLN
INTRAVENOUS | Status: DC
  Administered 2012-04-25: 10:00:00 via INTRAVENOUS

## 2012-04-25 MED ORDER — GLYCOPYRROLATE 0.2 MG/ML IJ SOLN
INTRAMUSCULAR | Status: AC
  Filled 2012-04-25: qty 1

## 2012-04-25 MED ORDER — ONDANSETRON HCL 4 MG/2ML IJ SOLN: 4.0000 mg | Freq: Once | INTRAMUSCULAR | Status: DC | PRN

## 2012-04-25 MED ORDER — LIDOCAINE HCL (PF) 1 % IJ SOLN
INTRAMUSCULAR | Status: AC
  Filled 2012-04-25: qty 5

## 2012-04-25 MED ORDER — ACETAMINOPHEN 500 MG PO TABS
ORAL_TABLET | ORAL | Status: AC
  Filled 2012-04-25: qty 1

## 2012-04-25 MED ORDER — PROPOFOL 10 MG/ML IV BOLUS
INTRAVENOUS | Status: DC | PRN
  Administered 2012-04-25: 200 mg via INTRAVENOUS
  Administered 2012-04-25 (×2): 30 mg via INTRAVENOUS

## 2012-04-25 MED ORDER — DEXTROSE 5 % IV SOLN: 3.0000 g | INTRAVENOUS | Status: DC

## 2012-04-25 MED ORDER — DIPHENHYDRAMINE HCL 25 MG PO CAPS: 25.0000 mg | ORAL_CAPSULE | ORAL | Status: DC | PRN

## 2012-04-25 MED ORDER — CEFAZOLIN SODIUM-DEXTROSE 2-3 GM-% IV SOLR
2.0000 g | INTRAVENOUS | Status: AC
  Administered 2012-04-25: 2 g via INTRAVENOUS

## 2012-04-25 MED ORDER — ROCURONIUM BROMIDE 100 MG/10ML IV SOLN
INTRAVENOUS | Status: DC | PRN
  Administered 2012-04-25: 10 mg via INTRAVENOUS
  Administered 2012-04-25: 25 mg via INTRAVENOUS
  Administered 2012-04-25: 5 mg via INTRAVENOUS

## 2012-04-25 MED ORDER — NEOSTIGMINE METHYLSULFATE 1 MG/ML IJ SOLN
INTRAMUSCULAR | Status: DC | PRN
  Administered 2012-04-25: 2 mg via INTRAVENOUS

## 2012-04-25 MED ORDER — SUCCINYLCHOLINE CHLORIDE 20 MG/ML IJ SOLN
INTRAMUSCULAR | Status: DC | PRN
  Administered 2012-04-25: 140 mg via INTRAVENOUS

## 2012-04-25 SURGICAL SUPPLY — 48 items
ARTHROWAND PARAGON T2 (SURGICAL WAND)
BAG HAMPER (MISCELLANEOUS) ×2 IMPLANT
BANDAGE ELASTIC 6 VELCRO NS (GAUZE/BANDAGES/DRESSINGS) ×2 IMPLANT
BLADE AGGRESSIVE PLUS 4.0 (BLADE) ×2 IMPLANT
BLADE SURG SZ11 CARB STEEL (BLADE) ×2 IMPLANT
CHLORAPREP W/TINT 26ML (MISCELLANEOUS) ×4 IMPLANT
CLOTH BEACON ORANGE TIMEOUT ST (SAFETY) ×2 IMPLANT
COOLER CRYO IC GRAV AND TUBE (ORTHOPEDIC SUPPLIES) ×2 IMPLANT
CUFF CRYO KNEE LG 20X31 COOLER (ORTHOPEDIC SUPPLIES) ×2 IMPLANT
CUFF CRYO KNEE18X23 MED (MISCELLANEOUS) IMPLANT
CUFF TOURNIQUET SINGLE 34IN LL (TOURNIQUET CUFF) IMPLANT
CUFF TOURNIQUET SINGLE 44IN (TOURNIQUET CUFF) ×2 IMPLANT
CUTTER ANGLED DBL BITE 4.5 (BURR) IMPLANT
DECANTER SPIKE VIAL GLASS SM (MISCELLANEOUS) ×4 IMPLANT
GAUZE SPONGE 4X4 16PLY XRAY LF (GAUZE/BANDAGES/DRESSINGS) ×2 IMPLANT
GAUZE XEROFORM 5X9 LF (GAUZE/BANDAGES/DRESSINGS) ×2 IMPLANT
GLOVE ECLIPSE 6.5 STRL STRAW (GLOVE) ×2 IMPLANT
GLOVE INDICATOR 7.0 STRL GRN (GLOVE) ×4 IMPLANT
GLOVE SKINSENSE NS SZ8.0 LF (GLOVE) ×1
GLOVE SKINSENSE STRL SZ8.0 LF (GLOVE) ×1 IMPLANT
GLOVE SS N UNI LF 8.5 STRL (GLOVE) ×2 IMPLANT
GOWN STRL REIN XL XLG (GOWN DISPOSABLE) ×6 IMPLANT
HLDR LEG FOAM (MISCELLANEOUS) ×1 IMPLANT
IV NS IRRIG 3000ML ARTHROMATIC (IV SOLUTION) ×6 IMPLANT
KIT BLADEGUARD II DBL (SET/KITS/TRAYS/PACK) ×2 IMPLANT
KIT ROOM TURNOVER AP CYSTO (KITS) ×2 IMPLANT
LEG HOLDER FOAM (MISCELLANEOUS) ×1
MANIFOLD NEPTUNE II (INSTRUMENTS) ×2 IMPLANT
MARKER SKIN DUAL TIP RULER LAB (MISCELLANEOUS) ×2 IMPLANT
NEEDLE HYPO 18GX1.5 BLUNT FILL (NEEDLE) ×2 IMPLANT
NEEDLE HYPO 21X1.5 SAFETY (NEEDLE) ×2 IMPLANT
NEEDLE SPNL 18GX3.5 QUINCKE PK (NEEDLE) ×2 IMPLANT
NS IRRIG 1000ML POUR BTL (IV SOLUTION) ×2 IMPLANT
PACK ARTHRO LIMB DRAPE STRL (MISCELLANEOUS) ×2 IMPLANT
PAD ABD 5X9 TENDERSORB (GAUZE/BANDAGES/DRESSINGS) ×2 IMPLANT
PAD ARMBOARD 7.5X6 YLW CONV (MISCELLANEOUS) ×2 IMPLANT
PADDING CAST COTTON 6X4 STRL (CAST SUPPLIES) ×2 IMPLANT
SET ARTHROSCOPY INST (INSTRUMENTS) ×2 IMPLANT
SET ARTHROSCOPY PUMP TUBE (IRRIGATION / IRRIGATOR) ×2 IMPLANT
SET BASIN LINEN APH (SET/KITS/TRAYS/PACK) ×2 IMPLANT
SPONGE GAUZE 4X4 12PLY (GAUZE/BANDAGES/DRESSINGS) ×2 IMPLANT
SUT ETHILON 3 0 FSL (SUTURE) ×2 IMPLANT
SYR 30ML LL (SYRINGE) ×2 IMPLANT
SYRINGE 10CC LL (SYRINGE) ×2 IMPLANT
WAND 50 DEG COVAC W/CORD (SURGICAL WAND) ×2 IMPLANT
WAND 90 DEG TURBOVAC W/CORD (SURGICAL WAND) IMPLANT
WAND ARTHRO PARAGON T2 (SURGICAL WAND) IMPLANT
YANKAUER SUCT BULB TIP 10FT TU (MISCELLANEOUS) ×8 IMPLANT

## 2012-04-25 NOTE — Anesthesia Procedure Notes (Signed)
Procedure Name: Intubation Date/Time: 04/25/2012 10:07 AM Performed by: Franco Nones Pre-anesthesia Checklist: Patient identified, Patient being monitored, Timeout performed, Emergency Drugs available and Suction available Patient Re-evaluated:Patient Re-evaluated prior to inductionOxygen Delivery Method: Circle System Utilized Preoxygenation: Pre-oxygenation with 100% oxygen Intubation Type: IV induction, Rapid sequence and Cricoid Pressure applied Laryngoscope Size: Mac and 4 Grade View: Grade II Tube type: Oral Tube size: 7.0 mm Number of attempts: 1 Airway Equipment and Method: stylet Placement Confirmation: ETT inserted through vocal cords under direct vision,  positive ETCO2 and breath sounds checked- equal and bilateral Secured at: 21 cm Tube secured with: Tape Dental Injury: Teeth and Oropharynx as per pre-operative assessment

## 2012-04-25 NOTE — Interval H&P Note (Signed)
History and Physical Interval Note:  04/25/2012 9:22 AM  Terry Case  has presented today for surgery, with the diagnosis of meniscus tear right knee  The various methods of treatment have been discussed with the patient and family. After consideration of risks, benefits and other options for treatment, the patient has consented to  Procedure(s) (LRB) with comments: KNEE ARTHROSCOPY WITH MEDIAL MENISECTOMY (Right) as a surgical intervention .  The patient's history has been reviewed, patient examined, no change in status, stable for surgery.  I have reviewed the patient's chart and labs.  Questions were answered to the patient's satisfaction.     Fuller Canada

## 2012-04-25 NOTE — Transfer of Care (Signed)
Immediate Anesthesia Transfer of Care Note  Patient: Terry Case  Procedure(s) Performed: Procedure(s) (LRB): KNEE ARTHROSCOPY WITH MEDIAL MENISECTOMY (Right)  Patient Location: PACU  Anesthesia Type: General  Level of Consciousness: awake  Airway & Oxygen Therapy: Patient Spontanous Breathing and non-rebreather face mask  Post-op Assessment: Report given to PACU RN, Post -op Vital signs reviewed and stable and Patient moving all extremities  Post vital signs: Reviewed and stable  Complications: No apparent anesthesia complications

## 2012-04-25 NOTE — Op Note (Signed)
04/25/2012  11:08 AM  PATIENT:  Terry Case  46 y.o. male  PRE-OPERATIVE DIAGNOSIS:  meniscus tear right knee  POST-OPERATIVE DIAGNOSIS:  meniscus tear right knee and osteoarthritis  PROCEDURE:  Procedure(s) (LRB) with comments: KNEE ARTHROSCOPY WITH MEDIAL MENISECTOMY (Right)  FINDINGS TORN MEDIAL MENISCUS WITH TEAR TO THE ROOT AND CAPSULE, GRADE 4 OA TROCHLEA  SURGEON:  Surgeon(s) and Role:    * Vickki Hearing, MD - Primary  Details of the procedure. The patient was identified in the preop area and the right knee was marked as a surgical site confirmed chart was reviewed.  Patient was taken to the operating room for general anesthesia. After successful anesthesia which was smooth the patient was prepped and draped in the supine position with sterile technique  After we completed the procedure of the timeout we did the procedure in the following manner:  The portals were injected with dilute epinephrine solution  An 11 blade was used to establish a lateral portal and the scope was placed into the joint into the medial compartment  A diagnostic arthroscopy was performed feeling all compartments of the knee.  A spinal needle was then placed through the medial portal until an appropriate portal position was obtained and then an 11 blade was used to make a medial portal. A dilator was placed in the portal and then a shaver was placed to debride the torn medial meniscus and displaced meniscal fragment.  The tear was a radial tear completely back to the meniscal capsular junction and involving the root of the medial meniscus. This was debrided with a combination of instruments until a stable rim was obtained and then the meniscus was contoured and balanced.  The scope was then placed into the patellofemoral joint had a brief debridement was performed to evaluate the articular cartilage of the patella and the trochlea. Grade 4 changes were found throughout the trochlea. The  articular cartilage on the patella seemed normal.  The joint was washed with fluid and suctioned dry followed by injection of 60 cc of Marcaine with epinephrine. 2 sutures were placed one in each portal. A sterile dressing, Ace wrap and Cryo/Cuff were placed and a Cryo/Cuff was activated. The patient was then extubated and taken to the recovery room in stable condition  PHYSICIAN ASSISTANT:   ASSISTANTS: none   ANESTHESIA:   general  EBL:  Total I/O In: 900 [I.V.:900] Out: 0   BLOOD ADMINISTERED:none  DRAINS: none   LOCAL MEDICATIONS USED:  MARCAINE   , Amount: 60 ml and OTHER EPI  SPECIMEN:  No Specimen  DISPOSITION OF SPECIMEN:  N/A  COUNTS:  YES  TOURNIQUET:  * Missing tourniquet times found for documented tourniquets in log:  [id number] *  DICTATION: .Dragon Dictation  PLAN OF CARE: Discharge to home after PACU  PATIENT DISPOSITION:  PACU - hemodynamically stable.   Delay start of Pharmacological VTE agent (>24hrs) due to surgical blood loss or risk of bleeding: not applicable

## 2012-04-25 NOTE — Brief Op Note (Signed)
04/25/2012  11:08 AM  PATIENT:  Charna Elizabeth  46 y.o. male  PRE-OPERATIVE DIAGNOSIS:  meniscus tear right knee  POST-OPERATIVE DIAGNOSIS:  meniscus tear right knee and osteoarthritis  PROCEDURE:  Procedure(s) (LRB) with comments: KNEE ARTHROSCOPY WITH MEDIAL MENISECTOMY (Right)  FINDINGS TORN MEDIAL MENISCUS WITH TEAR TO THE ROOT AND CAPSULE, GRADE 4 OA TROCHLEA  SURGEON:  Surgeon(s) and Role:    * Vickki Hearing, MD - Primary  PHYSICIAN ASSISTANT:   ASSISTANTS: none   ANESTHESIA:   general  EBL:  Total I/O In: 900 [I.V.:900] Out: 0   BLOOD ADMINISTERED:none  DRAINS: none   LOCAL MEDICATIONS USED:  MARCAINE   , Amount: 60 ml and OTHER EPI  SPECIMEN:  No Specimen  DISPOSITION OF SPECIMEN:  N/A  COUNTS:  YES  TOURNIQUET:  * Missing tourniquet times found for documented tourniquets in log:  [id number] *  DICTATION: .Dragon Dictation  PLAN OF CARE: Discharge to home after PACU  PATIENT DISPOSITION:  PACU - hemodynamically stable.   Delay start of Pharmacological VTE agent (>24hrs) due to surgical blood loss or risk of bleeding: not applicable

## 2012-04-25 NOTE — Anesthesia Preprocedure Evaluation (Signed)
Anesthesia Evaluation  Patient identified by MRN, date of birth, ID band Patient awake    Reviewed: Allergy & Precautions, H&P , NPO status , Patient's Chart, lab work & pertinent test results  History of Anesthesia Complications Negative for: history of anesthetic complications  Airway Mallampati: III TM Distance: >3 FB     Dental  (+) Teeth Intact   Pulmonary asthma , sleep apnea , COPDCurrent Smoker,  breath sounds clear to auscultation        Cardiovascular negative cardio ROS  Rhythm:Regular Rate:Normal     Neuro/Psych    GI/Hepatic GERD-  Medicated and Controlled,  Endo/Other    Renal/GU      Musculoskeletal   Abdominal   Peds  Hematology   Anesthesia Other Findings   Reproductive/Obstetrics                           Anesthesia Physical Anesthesia Plan  ASA: III  Anesthesia Plan: General   Post-op Pain Management:    Induction: Intravenous, Rapid sequence and Cricoid pressure planned  Airway Management Planned: Oral ETT  Additional Equipment:   Intra-op Plan:   Post-operative Plan: Extubation in OR  Informed Consent: I have reviewed the patients History and Physical, chart, labs and discussed the procedure including the risks, benefits and alternatives for the proposed anesthesia with the patient or authorized representative who has indicated his/her understanding and acceptance.     Plan Discussed with:   Anesthesia Plan Comments:         Anesthesia Quick Evaluation

## 2012-04-25 NOTE — Anesthesia Postprocedure Evaluation (Signed)
Anesthesia Post Note  Patient: Terry Case  Procedure(s) Performed: Procedure(s) (LRB): KNEE ARTHROSCOPY WITH MEDIAL MENISECTOMY (Right)  Anesthesia type: General  Patient location: PACU  Post pain: Pain level controlled  Post assessment: Post-op Vital signs reviewed, Patient's Cardiovascular Status Stable, Respiratory Function Stable, Patent Airway, No signs of Nausea or vomiting and Pain level controlled  Last Vitals:  Filed Vitals:   04/25/12 1129  BP: 155/96  Pulse: 76  Temp: 36.5 C  Resp: 16    Post vital signs: Reviewed and stable  Level of consciousness: awake and alert   Complications: No apparent anesthesia complications

## 2012-04-28 ENCOUNTER — Ambulatory Visit (INDEPENDENT_AMBULATORY_CARE_PROVIDER_SITE_OTHER): Payer: Medicare Other | Admitting: Orthopedic Surgery

## 2012-04-28 ENCOUNTER — Encounter: Payer: Self-pay | Admitting: Orthopedic Surgery

## 2012-04-28 VITALS — BP 144/82 | Ht 68.0 in | Wt 274.0 lb

## 2012-04-28 DIAGNOSIS — Z9889 Other specified postprocedural states: Secondary | ICD-10-CM

## 2012-04-28 MED ORDER — DIPHENHYDRAMINE HCL 25 MG PO CAPS: 25.0000 mg | ORAL_CAPSULE | ORAL | Status: DC | PRN

## 2012-04-28 MED ORDER — HYDROCODONE-ACETAMINOPHEN 10-325 MG PO TABS: 1.0000 | ORAL_TABLET | ORAL | Status: DC | PRN

## 2012-04-28 NOTE — Progress Notes (Signed)
Patient ID: Terry Case, male   DOB: 12-Apr-1966, 46 y.o.   MRN: [id number] Chief Complaint  Patient presents with  . Routine Post Op    post op 1, SARK, DOS 04/25/12   1. S/P right knee arthroscopy    C/o pain and throbbing on Dilaudid   Ambulating with a cane, but he told me he went home with no crutches.  Recommend hydrocodone with Benadryl, secondary to codeine/hydrocodone allergy with itching.  Plain incisions. Mild swelling. Flexion 75.  Operative findings PRE-OPERATIVE DIAGNOSIS: meniscus tear right knee  POST-OPERATIVE DIAGNOSIS: meniscus tear right knee and osteoarthritis  PROCEDURE: Procedure(s) (LRB) with comments:  KNEE ARTHROSCOPY WITH MEDIAL MENISECTOMY (Right)  FINDINGS TORN MEDIAL MENISCUS WITH TEAR TO THE ROOT AND CAPSULE, GRADE 4 OA TROCHLEA    Start physical therapy. Return in 4 weeks

## 2012-04-28 NOTE — Patient Instructions (Addendum)
Start Physical therapy : call hospital  to make appointment

## 2012-04-30 ENCOUNTER — Ambulatory Visit (HOSPITAL_COMMUNITY)
Admission: RE | Admit: 2012-04-30 | Discharge: 2012-04-30 | Disposition: A | Discharge status: Home / Self Care | Payer: Medicare Other | Source: Ambulatory Visit | Attending: Orthopedic Surgery | Admitting: Orthopedic Surgery

## 2012-04-30 DIAGNOSIS — R262 Difficulty in walking, not elsewhere classified: Secondary | ICD-10-CM | POA: Diagnosis not present

## 2012-04-30 DIAGNOSIS — M25669 Stiffness of unspecified knee, not elsewhere classified: Secondary | ICD-10-CM | POA: Diagnosis not present

## 2012-04-30 DIAGNOSIS — IMO0001 Reserved for inherently not codable concepts without codable children: Secondary | ICD-10-CM | POA: Insufficient documentation

## 2012-04-30 DIAGNOSIS — M25569 Pain in unspecified knee: Secondary | ICD-10-CM | POA: Insufficient documentation

## 2012-04-30 DIAGNOSIS — M6281 Muscle weakness (generalized): Secondary | ICD-10-CM | POA: Diagnosis not present
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
## 2012-05-05 ENCOUNTER — Ambulatory Visit (HOSPITAL_COMMUNITY)
Admission: RE | Admit: 2012-05-05 | Discharge: 2012-05-05 | Disposition: A | Discharge status: Home / Self Care | Payer: Medicare Other | Source: Ambulatory Visit | Attending: Orthopedic Surgery | Admitting: Orthopedic Surgery

## 2012-05-05 DIAGNOSIS — R262 Difficulty in walking, not elsewhere classified: Secondary | ICD-10-CM | POA: Insufficient documentation

## 2012-05-05 DIAGNOSIS — M25569 Pain in unspecified knee: Secondary | ICD-10-CM | POA: Insufficient documentation

## 2012-05-05 DIAGNOSIS — M6281 Muscle weakness (generalized): Secondary | ICD-10-CM | POA: Diagnosis not present

## 2012-05-05 DIAGNOSIS — IMO0001 Reserved for inherently not codable concepts without codable children: Secondary | ICD-10-CM | POA: Diagnosis not present

## 2012-05-05 DIAGNOSIS — M25669 Stiffness of unspecified knee, not elsewhere classified: Secondary | ICD-10-CM | POA: Diagnosis not present

## 2012-05-05 DIAGNOSIS — M549 Dorsalgia, unspecified: Secondary | ICD-10-CM | POA: Diagnosis not present

## 2012-05-05 NOTE — Progress Notes (Addendum)
Physical Therapy Treatment Patient Details  Name: Terry Case MRN: [id number] Date of Birth: [birth year]  Today's Date: 05/05/2012 Time: 6578-4696 PT Time Calculation (min): 39 min Charges: 75' TE Visit#: 2 of 12   Re-eval: 05/30/12   Authorization: MEDICARE  Authorization Time Period:    Authorization Visit#: 2 of 10    Subjective: Symptoms/Limitations Symptoms: Pt reports that he is doing pretty well.  he states he continues to limp because he is feeling a little off balance.  Reports min compliance with HEP.   Exercise/Treatments Stretches Active Hamstring Stretch: 3 reps;30 seconds (using towel behind knee) Quad Stretch: 3 reps;30 seconds (prone) Gastroc Stretch: 3 reps;30 seconds (Slant board) Aerobic Elliptical: 5 min @ 1.0 to even stride length and increase LE strength Standing Heel Raises: 15 reps;Limitations Heel Raises Limitations: Toe Raises: 15 Knee Flexion: Right;15 reps;Limitations Knee Flexion Limitations: 3# Functional Squat: 15 reps Other Standing Knee Exercises: Retro and Tandem gait 2 RT each, cueing for proper gait mechanics x4 RT Other Standing Knee Exercises: Hip Abd and Ext 15x 3# Supine Short Arc Quad Sets: Right;10 reps;Limitations Short Arc Quad Sets Limitations: 5 sec hold Bridges: 10 reps;Limitations Bridges Limitations: 5 sec hold Straight Leg Raises: Right;15 reps   Physical Therapy Assessment and Plan PT Assessment and Plan Clinical Impression Statement: Pt able to complete all activities without pain. Educated pt on common findings on swelling s/p knee scope.  Encouraged pt to continue with HEP to improve strength and approrpiate gait.  PT Plan: Pt is illiterate.  Add rocker board, stair training, heel and toe walking, SLS,  s/l clam shelles Continue to encourage appropriate gait mechanics.     Goals    Problem List Patient Active Problem List  Diagnosis  . OSA (obstructive sleep apnea)  . OA (osteoarthritis) of knee  . Acute  medial meniscus tear of left knee  . S/P right knee arthroscopy    PT - End of Session Activity Tolerance: Patient tolerated treatment well PT Plan of Care PT Home Exercise Plan: see scanned report PT Patient Instructions: Discussed importance of HEP. discussed Cx and NS policy.   Consulted and Agree with Plan of Care: Patient  GP Functional Assessment Tool Used: LEFS: 22/80 (27.5%)  Marynell Bies, PT 05/05/2012, 10:21 AM

## 2012-05-07 ENCOUNTER — Ambulatory Visit (HOSPITAL_COMMUNITY)
Admission: RE | Admit: 2012-05-07 | Discharge: 2012-05-07 | Disposition: A | Discharge status: Home / Self Care | Payer: Medicare Other | Source: Ambulatory Visit | Attending: Orthopedic Surgery | Admitting: Orthopedic Surgery

## 2012-05-07 NOTE — Progress Notes (Signed)
Physical Therapy Treatment Patient Details  Name: Terry Case MRN: [id number] Date of Birth: Sep 14, 1965  Today's Date: 05/07/2012 Time: 4098-1191 PT Time Calculation (min): 42 min Charges: 45'  Visit#: 3  of 12   Re-eval: 05/30/12    Authorization: MEDICARE  Authorization Time Period:    Authorization Visit#: 3  of 10    Subjective: Symptoms/Limitations Symptoms: Pt reports that he is doing his best not to limp.  He comes in today with normalized gait mechanics.  Pain Assessment Currently in Pain?: No/denies  Precautions/Restrictions     Exercise/Treatments  Stretches Gastroc Stretch: 3 reps;30 seconds (slant board) Aerobic Elliptical: 8' @ 2.0 @ end of session Standing Heel Raises: 20 reps Heel Raises Limitations: Toe Raises: 20 Lateral Step Up: Right;10 reps;Hand Hold: 1;Step Height: 4";Limitations Lateral Step Up Limitations: Hip Hikes 4 in. step 0 HHA, x10  Forward Step Up: Right;Hand Hold: 0;15 reps;Step Height: 4" Step Down: Right;10 reps;Hand Hold: 0;Step Height: 4" Functional Squat: 20 reps Rocker Board: 1 minute;Other (comment) (S<>S and A<>P w/2 HHA) SLS: 3x30 sec w/intermittent HHA Other Standing Knee Exercises: Retro and Tandem gait 2 RT each, cueing for proper gait mechanics x4 RT Supine Short Arc Quad Sets: Right;10 reps;Limitations Short Arc Quad Sets Limitations: 10 sec hold 3# Other Supine Knee Exercises: Green ball: Bridges x20, Roll ups x8 Sidelying Clams: 10x10 sec holds w/manual facilitation for gluteus medius activation  Physical Therapy Assessment and Plan PT Assessment and Plan Clinical Impression Statement: Pt overall has improved balance and LE strength.  Able to increase weights and reps without increased difficulty.  Continues to require mod cueing for proper gait mechanics.  PT Plan: Pt is illiterate. Continue to improve overall balance.  Add balance beam activities, vector stance    Goals Home Exercise Program Pt will Perform  Home Exercise Program: Independently PT Goal: Perform Home Exercise Program - Progress: Progressing toward goal PT Short Term Goals Time to Complete Short Term Goals: 2 weeks PT Short Term Goal 1: Pt will report pain range 0-4/10.  PT Short Term Goal 1 - Progress: Progressing toward goal PT Short Term Goal 2: Pt will improve his knee AROM 0-120 degrees.  PT Short Term Goal 2 - Progress: Progressing toward goal PT Short Term Goal 3: Pt will improve his gait mechanics and demonstrate decreaesed antalgic gait.  PT Short Term Goal 3 - Progress: Progressing toward goal PT Short Term Goal 4: Pt will ambulate independently.  PT Short Term Goal 4 - Progress: Met PT Long Term Goals Time to Complete Long Term Goals: 4 weeks PT Long Term Goal 1: Pt will report pain less than 3/10 for 75% of his day for improved QOL.  PT Long Term Goal 1 - Progress: Progressing toward goal PT Long Term Goal 2: pt wil improve LEFS to 50/80 for improved percieved functional ability.  PT Long Term Goal 2 - Progress: Progressing toward goal Long Term Goal 3: Pt will improve R knee AROM to [facility] in order to ascend and descend 10 stairs with 1 handrail and reciprocal pattern n order to safely order community dwellings.  Long Term Goal 3 Progress: Progressing toward goal Long Term Goal 4: Pt will improve R LE strength to [facility] in order to ambulate independently with normal gait mechanics.  Long Term Goal 4 Progress: Progressing toward goal  Problem List Patient Active Problem List  Diagnosis  . OSA (obstructive sleep apnea)  . OA (osteoarthritis) of knee  . Acute medial meniscus tear of left  knee  . S/P right knee arthroscopy    PT - End of Session Activity Tolerance: Patient tolerated treatment well PT Plan of Care PT Home Exercise Plan: see scanned report PT Patient Instructions: Discussed importance of HEP. discussed Cx and NS policy.   Consulted and Agree with Plan of Care: Patient  GP Functional Assessment Tool  Used: LEFS: 22/80 (27.5%)  Lidwina Kaner, PT 05/07/2012, 10:16 AM

## 2012-05-09 ENCOUNTER — Ambulatory Visit (HOSPITAL_COMMUNITY)
Admission: RE | Admit: 2012-05-09 | Discharge: 2012-05-09 | Disposition: A | Discharge status: Home / Self Care | Payer: Medicare Other | Source: Ambulatory Visit | Attending: Orthopedic Surgery | Admitting: Orthopedic Surgery

## 2012-05-09 NOTE — Progress Notes (Signed)
Physical Therapy Treatment Patient Details  Name: Terry Case MRN: [id number] Date of Birth: 1965/12/12  Today's Date: 05/09/2012 Time: 8416-6063 PT Time Calculation (min): 38 min Charges 38' TE Visit#: 4  of 12   Re-eval: 05/30/12    Authorization: MEDICARE  Authorization Time Period:    Authorization Visit#: 4  of 10    Subjective: Symptoms/Limitations Symptoms: He reports that he is doing his exercises at home and is able to independently explain and demonstrate.  He reports that he has some stiffness in his right knee.  Pain Assessment Currently in Pain?: Yes ("its just a little sore and stiff") Pain Location: Knee Pain Orientation: Right  Precautions/Restrictions     Exercise/Treatments Aerobic Elliptical: 8' @ 2.0 @ beginning session for activity tolerance Machines for Strengthening Cybex Knee Extension: 3 PL x10 w/R ecc lowering Standing Lateral Step Up: Right;10 reps;Hand Hold: 1;Step Height: 6" Lateral Step Up Limitations: Hip Hikes 6 in. step 2 HHA, x15  Forward Step Up: Right;10 reps;Hand Hold: 1;Step Height: 6" Step Down: Right;Hand Hold: 0;Step Height: 4";15 reps Wall Squat: Limitations Wall Squat Limitations: 3 reps 30 sec SLS with Vectors: 5 x 2 sec holds Other Standing Knee Exercises: Balance beam tandem x 2rt w/min A, 6 and 12' hurdles 1 RT w/R and L leading Seated Stool Scoot - Round Trips: 3 RT on carpet: forward and backward Supine Other Supine Knee Exercises: Green ball: Bridges x20, Roll ups x10 Sidelying Hip ABduction: Limitations Hip ABduction Limitations: rainbows x10 w/manual assistance Clams: 5x10 sec holds w/5 pulses afterwards    Physical Therapy Assessment and Plan PT Assessment and Plan Clinical Impression Statement: Added activities to encourage strength, ROM and decrease knee stiffness.  Pt continues to improve overall independent gait mechanics.  PT Plan: Continue to improve balance and functional ROM.  Add airex to SLS when  able, standing TKE and progress to isokinetic strengthening.     Goals    Problem List Patient Active Problem List  Diagnosis  . OSA (obstructive sleep apnea)  . OA (osteoarthritis) of knee  . Acute medial meniscus tear of left knee  . S/P right knee arthroscopy    PT - End of Session Activity Tolerance: Patient tolerated treatment well PT Plan of Care PT Patient Instructions: pt instruction and demonstration with patellar mobs Consulted and Agree with Plan of Care: Patient  GP Functional Assessment Tool Used: LEFS: 22/80 (27.5%)  Terry Case 05/09/2012, 10:27 AM

## 2012-05-12 ENCOUNTER — Ambulatory Visit (HOSPITAL_COMMUNITY)
Admission: RE | Admit: 2012-05-12 | Discharge: 2012-05-12 | Disposition: A | Discharge status: Home / Self Care | Payer: Medicare Other | Source: Ambulatory Visit | Attending: Physical Therapy | Admitting: Physical Therapy

## 2012-05-12 NOTE — Progress Notes (Addendum)
Physical Therapy Treatment Patient Details  Name: Terry Case MRN: [id number] Date of Birth: [birth year]  Today's Date: 05/12/2012 Time: 1017-1050 PT Time Calculation (min): 33 min Charges: 84' TE Visit#: 5  of 12   Re-eval: 05/30/12   Authorization: MEDICARE  Authorization Time Period:    Authorization Visit#: 5  of 10    Subjective: Symptoms/Limitations Symptoms: he reports that he is doing pretty well.   Exercise/Treatments Aerobic Elliptical: 8' @ 2.0 @ beginning session for activity tolerance Machines for Strengthening Cybex Knee Extension: 4 PL BLE x15 Cybex Knee Flexion: 5 PL x15 Isotonic: 150,130,90,60 x10 each  Standing Lateral Step Up: Right;Hand Hold: 0;Step Height: 6";15 reps Lateral Step Up Limitations: Hip Hikes 6 in. step 2 HHA, x10 Forward Step Up: Right;15 reps;Hand Hold: 0;Step Height: 6" Step Down: Right;15 reps;Step Height: 4" Wall Squat Limitations: 20x w/green ball SLS: 3x30 sec on foam w/intermittent HHA    Physical Therapy Assessment and Plan PT Assessment and Plan Clinical Impression Statement: Added isotonic strengthening today to improve knee strength and function.  Pt able to complete without increased pain.  Continues to demonstrate decreased antalgic gait. PT Plan: Continue to improve overall LE strength.  Add standing TKE next session and add back hamstring activities.  Encourage approrpaite gait.     Goals Home Exercise Program Pt will Perform Home Exercise Program: Independently PT Short Term Goals Time to Complete Short Term Goals: 2 weeks PT Short Term Goal 1: Pt will report pain range 0-4/10.  PT Short Term Goal 1 - Progress: Progressing toward goal (Avg. 3/10.  Range 0-5/10.) PT Short Term Goal 2: Pt will improve his knee AROM 0-120 degrees.  PT Short Term Goal 2 - Progress: Met PT Short Term Goal 3: Pt will improve his gait mechanics and demonstrate decreaesed antalgic gait.  PT Short Term Goal 3 - Progress: Progressing  toward goal PT Short Term Goal 4: Pt will ambulate independently.  PT Short Term Goal 4 - Progress: Met PT Long Term Goals Time to Complete Long Term Goals: 4 weeks PT Long Term Goal 1: Pt will report pain less than 3/10 for 75% of his day for improved QOL.  PT Long Term Goal 1 - Progress: Met PT Long Term Goal 2: pt wil improve LEFS to 50/80 for improved percieved functional ability.  PT Long Term Goal 2 - Progress: Progressing toward goal Long Term Goal 3: Pt will improve R knee AROM to [facility] in order to ascend and descend 10 stairs with 1 handrail and reciprocal pattern n order to safely order community dwellings.  Long Term Goal 3 Progress: Progressing toward goal Long Term Goal 4: Pt will improve R LE strength to [facility] in order to ambulate independently with normal gait mechanics.  Long Term Goal 4 Progress: Progressing toward goal  Problem List Patient Active Problem List  Diagnosis  . OSA (obstructive sleep apnea)  . OA (osteoarthritis) of knee  . Acute medial meniscus tear of left knee  . S/P right knee arthroscopy    PT - End of Session Activity Tolerance: Patient tolerated treatment well PT Plan of Care PT Patient Instructions: pt instruction and demonstration with patellar mobs Consulted and Agree with Plan of Care: Patient  GP Functional Assessment Tool Used: LEFS: 22/80 (27.5%)  Chanci Ojala, PT 05/12/2012, 10:55 AM

## 2012-05-14 ENCOUNTER — Ambulatory Visit (HOSPITAL_COMMUNITY)
Admission: RE | Admit: 2012-05-14 | Discharge: 2012-05-14 | Disposition: A | Discharge status: Home / Self Care | Payer: Medicare Other | Source: Ambulatory Visit | Attending: Orthopedic Surgery | Admitting: Orthopedic Surgery

## 2012-05-14 NOTE — Progress Notes (Signed)
Physical Therapy Treatment Patient Details  Name: Terry Case MRN: [id number] Date of Birth: Dec 09, 1965  Today's Date: 05/14/2012 Time: 1015-1056 PT Time Calculation (min): 41 min  Visit#: 6  of 12   Re-eval: 05/30/12  Charge: therex 38'  Authorization: MEDICARE  Authorization Time Period:    Authorization Visit#: 6  of 10    Subjective: Symptoms/Limitations Symptoms: Pt reports he overdid it yesterday in his yard.  Pain scale 7/10 today R Knee Pain Assessment Currently in Pain?: Yes Pain Score:   7 Pain Location: Knee Pain Orientation: Right  Objective:   Exercise/Treatments Stretches Quad Stretch: 1 rep;Limitations Quad Stretch Limitations: instructed standing quad st Aerobic Elliptical: 8' @ 2.0 @ beginning session for activity tolerance Machines for Strengthening Cybex Knee Extension: 4.5 PL BLE x15 Cybex Knee Flexion: 6.5 PL x 15 Standing Terminal Knee Extension: 10 reps;Theraband;Limitations Theraband Level (Terminal Knee Extension): Level 4 (Blue) Terminal Knee Extension Limitations: 10" holds Wall Squat Limitations: 20x w/green ball Stairs: 2 RT no HR SLS: 3x30 sec on foam w/intermittent HHA Seated Stool Scoot - Round Trips: 3 RT on carpet: forward and backward Other Seated Knee Exercises: Biodex isokinetic 150 <-> 120 <-> 90 <->60 10x    Physical Therapy Assessment and Plan PT Assessment and Plan Clinical Impression Statement: Continued isokinetic strengthening to improve knee strength and function.  Added standing TKE with mod cueing for proper form/technique to reduce compensation.  Pt stated pain reduced through exercise.  Pt continues to demonstrate decreased antalgic gait mechanics at end of session. PT Plan: Continue to improve overall LE strength.  add back hamstring strengthening activities and encourage appropraite gait mechanics.     Goals    Problem List Patient Active Problem List  Diagnosis  . OSA (obstructive sleep apnea)  . OA  (osteoarthritis) of knee  . Acute medial meniscus tear of left knee  . S/P right knee arthroscopy    PT - End of Session Activity Tolerance: Patient tolerated treatment well General Behavior During Session: [facility] for tasks performed Cognition: [facility] for tasks performed  GP    Juel Burrow 05/14/2012, 11:59 AM

## 2012-05-16 ENCOUNTER — Ambulatory Visit (HOSPITAL_COMMUNITY)
Admission: RE | Admit: 2012-05-16 | Discharge: 2012-05-16 | Disposition: A | Discharge status: Home / Self Care | Payer: Medicare Other | Source: Ambulatory Visit | Attending: Orthopedic Surgery | Admitting: Orthopedic Surgery

## 2012-05-16 NOTE — Progress Notes (Signed)
Physical Therapy Treatment Patient Details  Name: Terry Case MRN: [id number] Date of Birth: 1966-04-16  Today's Date: 05/16/2012 Time: 1015-1100 PT Time Calculation (min): 45 min  Visit#: 7  of 12   Re-eval: 05/30/12 Assessment Diagnosis: R Knee scope Surgical Date: 04/25/12 Next MD Visit: Dr. Romeo Apple - 05/26/12 Charge: therex 38'  Authorization: Medicare  Authorization Visit#: 7  of 10    Subjective: Symptoms/Limitations Symptoms: Min pain today maybe a 3/10 R knee Pain Assessment Currently in Pain?: Yes Pain Score:   3 Pain Location: Knee Pain Orientation: Right  Objective:   Exercise/Treatments Aerobic Elliptical: 8' @ 2.0 @ beginning session for activity tolerance Machines for Strengthening Cybex Knee Extension: 4.5 PL BLE x15 Cybex Knee Flexion: 6.5 PL x 15 Standing Terminal Knee Extension: 10 reps;Theraband;Limitations Theraband Level (Terminal Knee Extension): Level 4 (Blue) Terminal Knee Extension Limitations: 10" holds Lateral Step Up: Right;20 reps;Hand Hold: 0;Limitations;Step Height: 6" Lateral Step Up Limitations: Hip Hikes 6 in. step 2 HHA, x20 Forward Step Up: 20 reps;Hand Hold: 0;Step Height: 6" Step Down: Right;15 reps;Hand Hold: 1;Step Height: 6" Functional Squat: 15 reps;Limitations Functional Squat Limitations: proper lifting with 18# box Wall Squat: 10 reps;10 seconds Wall Squat Limitations: on BOSU Seated Stool Scoot - Round Trips: 3 RT on carpet: forward and backward Other Seated Knee Exercises: Biodex isokinetic 150 <-> 120 <-> 90 <->60 10x  Physical Therapy Assessment and Plan PT Assessment and Plan Clinical Impression Statement: Focus on proper body mechanics with lifting and progressed therex to dynamic surfaces for knee strengthening and function.  Pt limited by fatigue with increased demand. PT Plan: Continue to improve overall LE strength and gait mechanics.    Goals    Problem List Patient Active Problem List    Diagnosis  . OSA (obstructive sleep apnea)  . OA (osteoarthritis) of knee  . Acute medial meniscus tear of left knee  . S/P right knee arthroscopy    PT - End of Session Activity Tolerance: Patient tolerated treatment well General Behavior During Session: [facility] for tasks performed Cognition: [facility] for tasks performed  GP    Juel Burrow 05/16/2012, 10:59 AM

## 2012-05-19 ENCOUNTER — Ambulatory Visit (HOSPITAL_COMMUNITY)
Admission: RE | Admit: 2012-05-19 | Discharge: 2012-05-19 | Disposition: A | Discharge status: Home / Self Care | Payer: Medicare Other | Source: Ambulatory Visit | Attending: Orthopedic Surgery | Admitting: Orthopedic Surgery

## 2012-05-19 NOTE — Evaluation (Addendum)
Physical Therapy Discharge note  Patient Details  Name: Terry Case MRN: [id number] Date of Birth: 1966-01-06  Today's Date: 05/19/2012 Time: 7829-5621 PT Time Calculation (min): 33 min Charges: 1 ROM, 1 MMT 25' TE, 5' Self Care Visit#: 8  of 12   Re-eval: 05/30/12 Assessment Diagnosis: R Knee scope Surgical Date: 04/25/12 Next MD Visit: Dr. Romeo Apple - 05/26/12  Authorization: medicare  Authorization Time Period:    Authorization Visit#: 8  of 10    Subjective Symptoms/Limitations Symptoms: he reports that he is doing really well.  he states that he is just stiff more than anything. As far as my legs I am doing much better.  I still have most of my pain with my back.  How long can you sit comfortably?: sitting for an hour has pain about a 6-7/10 How long can you stand comfortably?: at least an hour.  How long can you walk comfortably?: walking about 45 minutes without pain independently Pain Assessment Currently in Pain?: No/denies  Sensation/Coordination/Flexibility/Functional Tests Functional Tests Functional Tests: LEFS: 54/80 (was 22/80)  Assessment RLE AROM (degrees) Right Knee Extension: 0  Right Knee Flexion: 120  (was 110) RLE Strength RLE Overall Strength Comments: 1 rep max Extension: L: 100 lbs: R: 70lbs; Flexion: 70lbs Bilateral Right Hip Flexion: 5/5 Right Hip Extension: 5/5 (was 3+/5) Right Hip ABduction: 5/5 Right Knee Flexion: 5/5 Right Knee Extension: 5/5 Palpation Palpation: without pain to gastroc.  Moderate pain to R knee joint line  Mobility/Balance  Ambulation/Gait Ambulation/Gait: Yes Ambulation/Gait Assistance: 7: Independent Gait Pattern: Within Functional Limits Static Standing Balance Single Leg Stance - Right Leg: 30  (was 5)   Exercise/Treatments Stretches Gastroc Stretch: 3 reps;30 seconds (slant board) Aerobic Elliptical: 8' @ 3.0 @ beginning session for activity tolerance Isokinetic: 150, 120, 90, 90 x10 up and  back Machines for Strengthening Cybex Knee Extension: 1 rep max: L: 100 lbs: R: 70lbs; 4.5 PL RLE only 10x Cybex Knee Flexion: 1 rep max: R and L 70lbs; BLE 7 PL x10 Standing Heel Raises: 20 reps Heel Raises Limitations: Toe Raises: 20 Wall Squat: 10 reps (10 sec holds) Stairs: 1 RT independent Gait Training: cueing to decrease antalgic gait  Physical Therapy Assessment and Plan PT Assessment and Plan Clinical Impression Statement: Mr. Londo has attended 8 OP PT visits s/p L knee scope with following: Has met all goals except continues to have moderate pain to L knee when sitting for long periods of time. he contineus to have improved gait with min cueing.  PT Plan: D/C    Goals Pt will Perform Home Exercise Program: Independently: Met PT Short Term Goals: 2 weeks PT Short Term Goal 1: Pt will report pain range 0-4/10.: Met PT Short Term Goal 2: Pt will improve his knee AROM 0-120 degrees.: Met PT Short Term Goal 3: Pt will improve his gait mechanics and demonstrate decreaesed antalgic gait.: Met PT Short Term Goal 4: Pt will ambulate independently. : Met PT Long Term Goals: 4 weeks PT Long Term Goal 1: Pt will report pain less than 3/10 for 75% of his day for improved QOL. : Met PT Long Term Goal 2: pt wil improve LEFS to 50/80 for improved percieved functional ability.: Met (54/80) Long Term Goal 3: Pt will improve R knee AROM to [facility] in order to ascend and descend 10 stairs with 1 handrail and reciprocal pattern n order to safely order community dwellings.: Met Long Term Goal 4: Pt will improve R LE strength to [facility]  in order to ambulate independently with normal gait mechanics.: Progressing toward goal (mild antalgic corrects w/min cueing)  Problem List Patient Active Problem List  Diagnosis  . OSA (obstructive sleep apnea)  . OA (osteoarthritis) of knee  . Acute medial meniscus tear of left knee  . S/P right knee arthroscopy    PT Plan of Care PT Patient Instructions:  discussed importance of gait mechanics to decrease risk of secondary injury to back. Discussed LEFS.  Encouraged to join [employer] and Agree with Plan of Care: Patient  GP Functional Assessment Tool Used: LEFS: 54/80 Functional Limitation: Mobility: Walking and moving around Mobility: Walking and Moving Around Goal Status [phone]): At least 20 percent but less than 40 percent impaired, limited or restricted Mobility: Walking and Moving Around Discharge Status [phone]): At least 20 percent but less than 40 percent impaired, limited or restricted  Shashwat Cleary, PT 05/19/2012, 10:59 AM  Physician Documentation Your signature is required to indicate approval of the treatment plan as stated above.  Please sign and either send electronically or make a copy of this report for your files and return this physician signed original.   Please mark one 1.__approve of plan  2. ___approve of plan with the following conditions.   ______________________________                                                          _____________________ Physician Signature                                                                                                             Date

## 2012-05-21 ENCOUNTER — Inpatient Hospital Stay (HOSPITAL_COMMUNITY): Admission: RE | Admit: 2012-05-21 | Payer: Medicare Other | Source: Ambulatory Visit

## 2012-05-23 ENCOUNTER — Ambulatory Visit (HOSPITAL_COMMUNITY): Payer: Medicare Other

## 2012-05-26 ENCOUNTER — Ambulatory Visit: Payer: Medicare Other | Admitting: Orthopedic Surgery

## 2012-05-26 ENCOUNTER — Encounter: Payer: Self-pay | Admitting: Orthopedic Surgery

## 2012-05-28 ENCOUNTER — Encounter: Payer: Self-pay | Admitting: Orthopedic Surgery

## 2012-05-28 ENCOUNTER — Ambulatory Visit (INDEPENDENT_AMBULATORY_CARE_PROVIDER_SITE_OTHER): Payer: Medicare Other | Admitting: Orthopedic Surgery

## 2012-05-28 DIAGNOSIS — M67919 Unspecified disorder of synovium and tendon, unspecified shoulder: Secondary | ICD-10-CM | POA: Diagnosis not present

## 2012-05-28 DIAGNOSIS — Z9889 Other specified postprocedural states: Secondary | ICD-10-CM

## 2012-05-28 DIAGNOSIS — M75101 Unspecified rotator cuff tear or rupture of right shoulder, not specified as traumatic: Secondary | ICD-10-CM | POA: Insufficient documentation

## 2012-05-28 DIAGNOSIS — M1710 Unilateral primary osteoarthritis, unspecified knee: Secondary | ICD-10-CM

## 2012-05-28 NOTE — Patient Instructions (Addendum)
Knee  activities as tolerated   Impingement Syndrome, Rotator Cuff, Bursitis with Rehab Impingement syndrome is a condition that involves inflammation of the tendons of the rotator cuff and the subacromial bursa, that causes pain in the shoulder. The rotator cuff consists of four tendons and muscles that control much of the shoulder and upper arm function. The subacromial bursa is a fluid filled sac that helps reduce friction between the rotator cuff and one of the bones of the shoulder (acromion). Impingement syndrome is usually an overuse injury that causes swelling of the bursa (bursitis), swelling of the tendon (tendonitis), and/or a tear of the tendon (strain). Strains are classified into three categories. Grade 1 strains cause pain, but the tendon is not lengthened. Grade 2 strains include a lengthened ligament, due to the ligament being stretched or partially ruptured. With grade 2 strains there is still function, although the function may be decreased. Grade 3 strains include a complete tear of the tendon or muscle, and function is usually impaired. SYMPTOMS    Pain around the shoulder, often at the outer portion of the upper arm.   Pain that gets worse with shoulder function, especially when reaching overhead or lifting.   Sometimes, aching when not using the arm.   Pain that wakes you up at night.   Sometimes, tenderness, swelling, warmth, or redness over the affected area.   Loss of strength.   Limited motion of the shoulder, especially reaching behind the back (to the back pocket or to unhook bra) or across your body.   Crackling sound (crepitation) when moving the arm.   Biceps tendon pain and inflammation (in the front of the shoulder). Worse when bending the elbow or lifting.  CAUSES   Impingement syndrome is often an overuse injury, in which chronic (repetitive) motions cause the tendons or bursa to become inflamed. A strain occurs when a force is paced on the tendon or  muscle that is greater than it can withstand. Common mechanisms of injury include: Stress from sudden increase in duration, frequency, or intensity of training.  Direct hit (trauma) to the shoulder.   Aging, erosion of the tendon with normal use.   Bony bump on shoulder (acromial spur).  RISK INCREASES WITH:  Contact sports (football, wrestling, boxing).   Throwing sports (baseball, tennis, volleyball).   Weightlifting and bodybuilding.   Heavy labor.   Previous injury to the rotator cuff, including impingement.   Poor shoulder strength and flexibility.   Failure to warm up properly before activity.   Inadequate protective equipment.   Old age.   Bony bump on shoulder (acromial spur).  PREVENTION    Warm up and stretch properly before activity.   Allow for adequate recovery between workouts.   Maintain physical fitness:   Strength, flexibility, and endurance.   Cardiovascular fitness.   Learn and use proper exercise technique.  PROGNOSIS   If treated properly, impingement syndrome usually goes away within 6 weeks. Sometimes surgery is required.   RELATED COMPLICATIONS    Longer healing time if not properly treated, or if not given enough time to heal.   Recurring symptoms, that result in a chronic condition.   Shoulder stiffness, frozen shoulder, or loss of motion.   Rotator cuff tendon tear.   Recurring symptoms, especially if activity is resumed too soon, with overuse, with a direct blow, or when using poor technique.  TREATMENT   Treatment first involves the use of ice and medicine, to reduce pain and inflammation. The use  of strengthening and stretching exercises may help reduce pain with activity. These exercises may be performed at home or with a therapist. If non-surgical treatment is unsuccessful after more than 6 months, surgery may be advised. After surgery and rehabilitation, activity is usually possible in 3 months.   MEDICATION  If pain medicine  is needed, nonsteroidal anti-inflammatory medicines (aspirin and ibuprofen), or other minor pain relievers (acetaminophen), are often advised.   Do not take pain medicine for 7 days before surgery.   Prescription pain relievers may be given, if your caregiver thinks they are needed. Use only as directed and only as much as you need.   Corticosteroid injections may be given by your caregiver. These injections should be reserved for the most serious cases, because they may only be given a certain number of times.  HEAT AND COLD  Cold treatment (icing) should be applied for 10 to 15 minutes every 2 to 3 hours for inflammation and pain, and immediately after activity that aggravates your symptoms. Use ice packs or an ice massage.   Heat treatment may be used before performing stretching and strengthening activities prescribed by your caregiver, physical therapist, or athletic trainer. Use a heat pack or a warm water soak.  SEEK MEDICAL CARE IF:    Symptoms get worse or do not improve in 4 to 6 weeks, despite treatment.   New, unexplained symptoms develop. (Drugs used in treatment may produce side effects.)  EXERCISES  3 sets of 10 reps hold for 2 sec  RANGE OF MOTION (ROM) AND STRETCHING EXERCISES - Impingement Syndrome (Rotator Cuff  Tendinitis, Bursitis) These exercises may help you when beginning to rehabilitate your injury. Your symptoms may go away with or without further involvement from your physician, physical therapist or athletic trainer. While completing these exercises, remember:    Restoring tissue flexibility helps normal motion to return to the joints. This allows healthier, less painful movement and activity.   An effective stretch should be held for at least 30 seconds.   A stretch should never be painful. You should only feel a gentle lengthening or release in the stretched tissue.  STRETCH  Flexion, Standing  Stand with good posture. With an underhand grip on your right /  left hand, and an overhand grip on the opposite hand, grasp a broomstick or cane so that your hands are a little more than shoulder width apart.   Keeping your right / left elbow straight and shoulder muscles relaxed, push the stick with your opposite hand, to raise your right / left arm in front of your body and then overhead. Raise your arm until you feel a stretch in your right / left shoulder, but before you have increased shoulder pain.   Try to avoid shrugging your right / left shoulder as your arm rises, by keeping your shoulder blade tucked down and toward your mid-back spine. Hold for __________ seconds.   Slowly return to the starting position.  Repeat __________ times. Complete this exercise __________ times per day. STRETCH  Abduction, Supine  Lie on your back. With an underhand grip on your right / left hand and an overhand grip on the opposite hand, grasp a broomstick or cane so that your hands are a little more than shoulder width apart.   Keeping your right / left elbow straight and your shoulder muscles relaxed, push the stick with your opposite hand, to raise your right / left arm out to the side of your body and then overhead.  Raise your arm until you feel a stretch in your right / left shoulder, but before you have increased shoulder pain.   Try to avoid shrugging your right / left shoulder as your arm rises, by keeping your shoulder blade tucked down and toward your mid-back spine. Hold for __________ seconds.   Slowly return to the starting position.  Repeat __________ times. Complete this exercise __________ times per day. ROM  Flexion, Active-Assisted  Lie on your back. You may bend your knees for comfort.   Grasp a broomstick or cane so your hands are about shoulder width apart. Your right / left hand should grip the end of the stick, so that your hand is positioned "thumbs-up," as if you were about to shake hands.   Using your healthy arm to lead, raise your right /  left arm overhead, until you feel a gentle stretch in your shoulder. Hold for __________ seconds.   Use the stick to assist in returning your right / left arm to its starting position.  Repeat __________ times. Complete this exercise __________ times per day.   ROM - Internal Rotation, Supine   Lie on your back on a firm surface. Place your right / left elbow about 60 degrees away from your side. Elevate your elbow with a folded towel, so that the elbow and shoulder are the same height.   Using a broomstick or cane and your strong arm, pull your right / left hand toward your body until you feel a gentle stretch, but no increase in your shoulder pain. Keep your shoulder and elbow in place throughout the exercise.   Hold for __________ seconds. Slowly return to the starting position.  Repeat __________ times. Complete this exercise __________ times per day. STRETCH - Internal Rotation  Place your right / left hand behind your back, palm up.   Throw a towel or belt over your opposite shoulder. Grasp the towel with your right / left hand.   While keeping an upright posture, gently pull up on the towel, until you feel a stretch in the front of your right / left shoulder.   Avoid shrugging your right / left shoulder as your arm rises, by keeping your shoulder blade tucked down and toward your mid-back spine.   Hold for __________ seconds. Release the stretch, by lowering your healthy hand.  Repeat __________ times. Complete this exercise __________ times per day. ROM - Internal Rotation   Using an underhand grip, grasp a stick behind your back with both hands.   While standing upright with good posture, slide the stick up your back until you feel a mild stretch in the front of your shoulder.   Hold for __________ seconds. Slowly return to your starting position.  Repeat __________ times. Complete this exercise __________ times per day.   STRETCH  Posterior Shoulder Capsule   Stand or sit  with good posture. Grasp your right / left elbow and draw it across your chest, keeping it at the same height as your shoulder.   Pull your elbow, so your upper arm comes in closer to your chest. Pull until you feel a gentle stretch in the back of your shoulder.   Hold for __________ seconds.  Repeat __________ times. Complete this exercise __________ times per day. STRENGTHENING EXERCISES - Impingement Syndrome (Rotator Cuff Tendinitis, Bursitis) These exercises may help you when beginning to rehabilitate your injury. They may resolve your symptoms with or without further involvement from your physician, physical therapist or athletic  trainer. While completing these exercises, remember:  Muscles can gain both the endurance and the strength needed for everyday activities through controlled exercises.   Complete these exercises as instructed by your physician, physical therapist or athletic trainer. Increase the resistance and repetitions only as guided.   You may experience muscle soreness or fatigue, but the pain or discomfort you are trying to eliminate should never worsen during these exercises. If this pain does get worse, stop and make sure you are following the directions exactly. If the pain is still present after adjustments, discontinue the exercise until you can discuss the trouble with your clinician.   During your recovery, avoid activity or exercises which involve actions that place your injured hand or elbow above your head or behind your back or head. These positions stress the tissues which you are trying to heal.  STRENGTH - Scapular Depression and Adduction   With good posture, sit on a firm chair. Support your arms in front of you, with pillows, arm rests, or on a table top. Have your elbows in line with the sides of your body.   Gently draw your shoulder blades down and toward your mid-back spine. Gradually increase the tension, without tensing the muscles along the top of  your shoulders and the back of your neck.   Hold for __________ seconds. Slowly release the tension and relax your muscles completely before starting the next repetition.   After you have practiced this exercise, remove the arm support and complete the exercise in standing as well as sitting position.  Repeat __________ times. Complete this exercise __________ times per day.   STRENGTH - Shoulder Abductors, Isometric  With good posture, stand or sit about 4-6 inches from a wall, with your right / left side facing the wall.   Bend your right / left elbow. Gently press your right / left elbow into the wall. Increase the pressure gradually, until you are pressing as hard as you can, without shrugging your shoulder or increasing any shoulder discomfort.   Hold for __________ seconds.   Release the tension slowly. Relax your shoulder muscles completely before you begin the next repetition.  Repeat __________ times. Complete this exercise __________ times per day.   STRENGTH - External Rotators, Isometric  Keep your right / left elbow at your side and bend it 90 degrees.   Step into a door frame so that the outside of your right / left wrist can press against the door frame without your upper arm leaving your side.   Gently press your right / left wrist into the door frame, as if you were trying to swing the back of your hand away from your stomach. Gradually increase the tension, until you are pressing as hard as you can, without shrugging your shoulder or increasing any shoulder discomfort.   Hold for __________ seconds.   Release the tension slowly. Relax your shoulder muscles completely before you begin the next repetition.  Repeat __________ times. Complete this exercise __________ times per day.   STRENGTH - Supraspinatus   Stand or sit with good posture. Grasp a __________ weight, or an exercise band or tubing, so that your hand is "thumbs-up," like you are shaking hands.   Slowly  lift your right / left arm in a "V" away from your thigh, diagonally into the space between your side and straight ahead. Lift your hand to shoulder height or as far as you can, without increasing any shoulder pain. At first, many people  do not lift their hands above shoulder height.   Avoid shrugging your right / left shoulder as your arm rises, by keeping your shoulder blade tucked down and toward your mid-back spine.   Hold for __________ seconds. Control the descent of your hand, as you slowly return to your starting position.  Repeat __________ times. Complete this exercise __________ times per day.   STRENGTH - External Rotators  Secure a rubber exercise band or tubing to a fixed object (table, pole) so that it is at the same height as your right / left elbow when you are standing or sitting on a firm surface.   Stand or sit so that the secured exercise band is at your uninjured side.   Bend your right / left elbow 90 degrees. Place a folded towel or small pillow under your right / left arm, so that your elbow is a few inches away from your side.   Keeping the tension on the exercise band, pull it away from your body, as if pivoting on your elbow. Be sure to keep your body steady, so that the movement is coming only from your rotating shoulder.   Hold for __________ seconds. Release the tension in a controlled manner, as you return to the starting position.  Repeat __________ times. Complete this exercise __________ times per day.   STRENGTH - Internal Rotators   Secure a rubber exercise band or tubing to a fixed object (table, pole) so that it is at the same height as your right / left elbow when you are standing or sitting on a firm surface.   Stand or sit so that the secured exercise band is at your right / left side.   Bend your elbow 90 degrees. Place a folded towel or small pillow under your right / left arm so that your elbow is a few inches away from your side.   Keeping the  tension on the exercise band, pull it across your body, toward your stomach. Be sure to keep your body steady, so that the movement is coming only from your rotating shoulder.   Hold for __________ seconds. Release the tension in a controlled manner, as you return to the starting position.  Repeat __________ times. Complete this exercise __________ times per day.   STRENGTH - Scapular Protractors, Standing   Stand arms length away from a wall. Place your hands on the wall, keeping your elbows straight.   Begin by dropping your shoulder blades down and toward your mid-back spine.   To strengthen your protractors, keep your shoulder blades down, but slide them forward on your rib cage. It will feel as if you are lifting the back of your rib cage away from the wall. This is a subtle motion and can be challenging to complete. Ask your caregiver for further instruction, if you are not sure you are doing the exercise correctly.   Hold for __________ seconds. Slowly return to the starting position, resting the muscles completely before starting the next repetition.  Repeat __________ times. Complete this exercise __________ times per day. STRENGTH - Scapular Protractors, Supine  Lie on your back on a firm surface. Extend your right / left arm straight into the air while holding a __________ weight in your hand.   Keeping your head and back in place, lift your shoulder off the floor.   Hold for __________ seconds. Slowly return to the starting position, and allow your muscles to relax completely before starting the next repetition.  Repeat __________ times. Complete this exercise __________ times per day. STRENGTH - Scapular Protractors, Quadruped  Get onto your hands and knees, with your shoulders directly over your hands (or as close as you can be, comfortably).   Keeping your elbows locked, lift the back of your rib cage up into your shoulder blades, so your mid-back rounds out. Keep your neck  muscles relaxed.   Hold this position for __________ seconds. Slowly return to the starting position and allow your muscles to relax completely before starting the next repetition.  Repeat __________ times. Complete this exercise __________ times per day.   STRENGTH - Scapular Retractors  Secure a rubber exercise band or tubing to a fixed object (table, pole), so that it is at the height of your shoulders when you are either standing, or sitting on a firm armless chair.   With a palm down grip, grasp an end of the band in each hand. Straighten your elbows and lift your hands straight in front of you, at shoulder height. Step back, away from the secured end of the band, until it becomes tense.   Squeezing your shoulder blades together, draw your elbows back toward your sides, as you bend them. Keep your upper arms lifted away from your body throughout the exercise.   Hold for __________ seconds. Slowly ease the tension on the band, as you reverse the directions and return to the starting position.  Repeat __________ times. Complete this exercise __________ times per day. STRENGTH - Shoulder Extensors   Secure a rubber exercise band or tubing to a fixed object (table, pole) so that it is at the height of your shoulders when you are either standing, or sitting on a firm armless chair.   With a thumbs-up grip, grasp an end of the band in each hand. Straighten your elbows and lift your hands straight in front of you, at shoulder height. Step back, away from the secured end of the band, until it becomes tense.   Squeezing your shoulder blades together, pull your hands down to the sides of your thighs. Do not allow your hands to go behind you.   Hold for __________ seconds. Slowly ease the tension on the band, as you reverse the directions and return to the starting position.  Repeat __________ times. Complete this exercise __________ times per day.   STRENGTH - Scapular Retractors and External  Rotators   Secure a rubber exercise band or tubing to a fixed object (table, pole) so that it is at the height as your shoulders, when you are either standing, or sitting on a firm armless chair.   With a palm down grip, grasp an end of the band in each hand. Bend your elbows 90 degrees and lift your elbows to shoulder height, at your sides. Step back, away from the secured end of the band, until it becomes tense.   Squeezing your shoulder blades together, rotate your shoulders so that your upper arms and elbows remain stationary, but your fists travel upward to head height.   Hold for __________ seconds. Slowly ease the tension on the band, as you reverse the directions and return to the starting position.  Repeat __________ times. Complete this exercise __________ times per day.   STRENGTH - Scapular Retractors and External Rotators, Rowing   Secure a rubber exercise band or tubing to a fixed object (table, pole) so that it is at the height of your shoulders, when you are either standing, or sitting on a firm  armless chair.   With a palm down grip, grasp an end of the band in each hand. Straighten your elbows and lift your hands straight in front of you, at shoulder height. Step back, away from the secured end of the band, until it becomes tense.   Step 1: Squeeze your shoulder blades together. Bending your elbows, draw your hands to your chest, as if you are rowing a boat. At the end of this motion, your hands and elbow should be at shoulder height and your elbows should be out to your sides.   Step 2: Rotate your shoulders, to raise your hands above your head. Your forearms should be vertical and your upper arms should be horizontal.   Hold for __________ seconds. Slowly ease the tension on the band, as you reverse the directions and return to the starting position.  Repeat __________ times. Complete this exercise __________ times per day.   STRENGTH  Scapular Depressors  Find a sturdy  chair without wheels, such as a dining room chair.   Keeping your feet on the floor, and your hands on the chair arms, lift your bottom up from the seat, and lock your elbows.   Keeping your elbows straight, allow gravity to pull your body weight down. Your shoulders will rise toward your ears.   Raise your body against gravity by drawing your shoulder blades down your back, shortening the distance between your shoulders and ears. Although your feet should always maintain contact with the floor, your feet should progressively support less body weight, as you get stronger.   Hold for __________ seconds. In a controlled and slow manner, lower your body weight to begin the next repetition.  Repeat __________ times. Complete this exercise __________ times per day.   Document Released: 06/18/2005 Document Revised: 09/10/2011 Document Reviewed: 09/30/2008 [facility] Patient Information 2013 [place], [place].

## 2012-05-28 NOTE — Addendum Note (Signed)
Addended by: Fuller Canada E on: 05/28/2012 10:45 AM   Modules accepted: Level of Service

## 2012-05-28 NOTE — Progress Notes (Signed)
Patient ID: Terry Case, male   DOB: 05-22-1966, 46 y.o.   MRN: [id number] Chief Complaint  Patient presents with  . Follow-up    Arthroscopy RIGHT knee, partial medial meniscectomy, degenerative arthritis   Chief Complaint   Patient presents with   .  Routine Post Op       post op 1, SARK, DOS 04/25/12    1.  S/P right knee arthroscopy     Operative findings PRE-OPERATIVE DIAGNOSIS: meniscus tear right knee   POST-OPERATIVE DIAGNOSIS: meniscus tear right knee and osteoarthritis   PROCEDURE: Procedure(s) (LRB) with comments:   KNEE ARTHROSCOPY WITH MEDIAL MENISECTOMY (Right)   FINDINGS TORN MEDIAL MENISCUS WITH TEAR TO THE ROOT AND CAPSULE, GRADE 4 OA TROCHLEA   Complain of pain, RIGHT shoulder, night pain, pain with poor elevation.  RIGHT knee is improving. Patient is ambulatory now with no limp no assistive device.  Has full range of motion, no effusion, no tenderness.  RIGHT shoulder painful for elevation at 120, painful arc of motion between 120, 180. Positive impingement sign with Neer maneuver and Hawkins maneuver. Normal external rotation. Grade 5 over 5. Manual muscle testing of the supraspinatus.  Impression Doing well post operatively from RIGHT knee arthroscopy and partial medial meniscectomy with grade 4 arthritis of the trochlea.  Impingement syndrome, RIGHT shoulder.  Recommend exercises for impingement syndrome.  Followup as needed.  Patient is allowed to play basketball in 2 weeks.

## 2012-06-04 DIAGNOSIS — Z125 Encounter for screening for malignant neoplasm of prostate: Secondary | ICD-10-CM | POA: Diagnosis not present

## 2012-06-04 DIAGNOSIS — M25569 Pain in unspecified knee: Secondary | ICD-10-CM | POA: Diagnosis not present

## 2012-06-10 ENCOUNTER — Inpatient Hospital Stay
Admit: 2012-06-10 | Discharge: 2012-06-10 | Disposition: A | Discharge status: Home / Self Care | Attending: Emergency Medicine

## 2012-06-10 MED ORDER — NAPROXEN 375 MG PO TABS
375 MG | ORAL_TABLET | Freq: Two times a day (BID) | ORAL | Status: DC
Start: 2012-06-10 — End: 2012-12-23

## 2012-06-10 MED ORDER — HYDROCODONE-ACETAMINOPHEN 5-325 MG PO TABS
5-325 MG | ORAL_TABLET | Freq: Four times a day (QID) | ORAL | Status: AC | PRN
Start: 2012-06-10 — End: 2012-06-17

## 2012-06-10 MED ADMIN — diphenhydrAMINE (BENADRYL) injection 25 mg: INTRAMUSCULAR | @ 22:00:00 | NDC 63323066401

## 2012-06-10 MED ADMIN — HYDROmorphone HCl PF (DILAUDID) injection SOLN 1 mg: INTRAMUSCULAR | @ 22:00:00 | NDC 00409128331

## 2012-06-10 MED FILL — DIPHENHYDRAMINE HCL 50 MG/ML IJ SOLN: 50 MG/ML | INTRAMUSCULAR | Qty: 1

## 2012-06-10 MED FILL — HYDROMORPHONE HCL PF 1 MG/ML IJ SOLN: 1 MG/ML | INTRAMUSCULAR | Qty: 1

## 2012-06-10 NOTE — Discharge Instructions (Signed)
Back Pain, Adult  Low back pain is very common. About 1 in 5 people have back pain.The cause of low back pain is rarely dangerous. The pain often gets better over time.About half of people with a sudden onset of back pain feel better in just 2 weeks. About 8 in 10 people feel better by 6 weeks.   CAUSES  Some common causes of back pain include:   Strain of the muscles or ligaments supporting the spine.   Wear and tear (degeneration) of the spinal discs.   Arthritis.   Direct injury to the back.  DIAGNOSIS  Most of the time, the direct cause of low back pain is not known.However, back pain can be treated effectively even when the exact cause of the pain is unknown.Answering your caregiver's questions about your overall health and symptoms is one of the most accurate ways to make sure the cause of your pain is not dangerous. If your caregiver needs more information, he or she may order lab work or imaging tests (X-rays or MRIs).However, even if imaging tests show changes in your back, this usually does not require surgery.  HOME CARE INSTRUCTIONS  For many people, back pain returns.Since low back pain is rarely dangerous, it is often a condition that people can learn to manageon their own.    Remain active. It is stressful on the back to sit or stand in one place. Do not sit, drive, or stand in one place for more than 30 minutes at a time. Take short walks on level surfaces as soon as pain allows.Try to increase the length of time you walk each day.   Do not stay in bed.Resting more than 1 or 2 days can delay your recovery.   Do not avoid exercise or work.Your body is made to move.It is not dangerous to be active, even though your back may hurt.Your back will likely heal faster if you return to being active before your pain is gone.   Pay attention to your body when you bend and lift. Many people have less discomfortwhen lifting if they bend their knees, keep the load close to their bodies,and  avoid twisting. Often, the most comfortable positions are those that put less stress on your recovering back.   Find a comfortable position to sleep. Use a firm mattress and lie on your side with your knees slightly bent. If you lie on your back, put a pillow under your knees.   Only take over-the-counter or prescription medicines as directed by your caregiver. Over-the-counter medicines to reduce pain and inflammation are often the most helpful.Your caregiver may prescribe muscle relaxant drugs.These medicines help dull your pain so you can more quickly return to your normal activities and healthy exercise.   Put ice on the injured area.   Put ice in a plastic bag.   Place a towel between your skin and the bag.   Leave the ice on for 15 to 20 minutes, 3 to 4 times a day for the first 2 to 3 days. After that, ice and heat may be alternated to reduce pain and spasms.   Ask your caregiver about trying back exercises and gentle massage. This may be of some benefit.   Avoid feeling anxious or stressed.Stress increases muscle tension and can worsen back pain.It is important to recognize when you are anxious or stressed and learn ways to manage it.Exercise is a great option.  SEEK MEDICAL CARE IF:   You have pain that is not   relieved with rest or medicine.   You have pain that does not improve in 1 week.   You have new symptoms.   You are generally not feeling well.  SEEK IMMEDIATE MEDICAL CARE IF:    You have pain that radiates from your back into your legs.   You develop new bowel or bladder control problems.   You have unusual weakness or numbness in your arms or legs.   You develop nausea or vomiting.   You develop abdominal pain.   You feel faint.  Document Released: 06/18/2005 Document Revised: 12/18/2011 Document Reviewed: 11/06/2010  [employer] Patient Information 2013 [employer].
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
## 2012-06-10 NOTE — ED Notes (Signed)
States pain is " much better ". Discharge instructions reviewed and scripts given, denies questions. Pts daughter here to take him home. Discharged to home via ambulation to private car.    Eusebio Friendly, RN  06/10/12 1752

## 2012-07-10 ENCOUNTER — Ambulatory Visit: Payer: Medicare Other | Admitting: Pulmonary Disease

## 2012-07-15 ENCOUNTER — Ambulatory Visit: Payer: Medicare Other | Admitting: Pulmonary Disease

## 2012-07-23 ENCOUNTER — Ambulatory Visit: Payer: Medicare Other | Admitting: Pulmonary Disease

## 2012-08-01 ENCOUNTER — Encounter: Payer: Self-pay | Admitting: Pulmonary Disease

## 2012-08-01 ENCOUNTER — Ambulatory Visit (INDEPENDENT_AMBULATORY_CARE_PROVIDER_SITE_OTHER): Payer: Medicare Other | Admitting: Pulmonary Disease

## 2012-08-01 VITALS — BP 152/80 | HR 94 | Temp 99.1°F | Ht 68.0 in | Wt 281.0 lb

## 2012-08-01 DIAGNOSIS — G4733 Obstructive sleep apnea (adult) (pediatric): Secondary | ICD-10-CM | POA: Diagnosis not present

## 2012-08-01 NOTE — Progress Notes (Signed)
  Subjective:    Patient ID: Terry Case, male    DOB: 1965-09-07, 47 y.o.   MRN: [id number]  HPI Patient comes in today for followup of his moderate obstructive sleep apnea.  He was initiated on CPAP, however could not tolerate the device.  We worked with him on mask fit and pressure, but his compliance continued to be poor.  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  The patient comes in today to discuss possible alternatives.  He feels very strongly that CPAP is not a viable therapy for him.  He continues to have snoring and disrupted sleep.   Review of Systems  Constitutional: Negative for fever and unexpected weight change.  HENT: Positive for congestion, rhinorrhea and postnasal drip. Negative for ear pain, nosebleeds, sore throat, sneezing, trouble swallowing, dental problem and sinus pressure.   Eyes: Negative for redness and itching.  Respiratory: Negative for cough, chest tightness, shortness of breath and wheezing.   Cardiovascular: Negative for palpitations and leg swelling.  Gastrointestinal: Negative for nausea and vomiting.  Genitourinary: Negative for dysuria.  Musculoskeletal: Negative for joint swelling.  Skin: Negative for rash.  Neurological: Positive for headaches.  Hematological: Does not bruise/bleed easily.  Psychiatric/Behavioral: Negative for dysphoric mood. The patient is not nervous/anxious.        Objective:   Physical Exam Obese male in no acute distress Nose without purulence or discharge noted Large neck without lymphadenopathy or thyromegaly Lower extremities without edema, cyanosis Alert and oriented, moves all 4 extremities.       Assessment & Plan:

## 2012-08-01 NOTE — Patient Instructions (Addendum)
Work on losing weight.  This is the key to resolving your snoring and sleep apnea If you want to try and treat your sleep apnea without cpap, can try oxygen at night while sleeping.  Let me know.

## 2012-08-01 NOTE — Assessment & Plan Note (Signed)
The patient has a history of moderate obstructive sleep apnea, but feels are strongly he is completely intolerant of CPAP.  I have offered to work with him with the mask fit and the device, but he feels this is not a viable therapy for him.  Alternatives for treatment include a trial of weight loss alone, upper airway surgery, and possibly a dental appliance or nocturnal oxygen.  At this point, the patient would like to take the next 6 months and work aggressively on weight loss to see if he can improve his symptoms.  He knows to call me if he does not have success, or if he wishes to treat his sleep apnea more aggressively with one of the alternatives.

## 2012-08-10 ENCOUNTER — Emergency Department (HOSPITAL_COMMUNITY)
Admission: EM | Admit: 2012-08-10 | Discharge: 2012-08-10 | Disposition: A | Discharge status: Home / Self Care | Payer: Medicare Other | Attending: Emergency Medicine | Admitting: Emergency Medicine

## 2012-08-10 ENCOUNTER — Encounter (HOSPITAL_COMMUNITY): Payer: Self-pay

## 2012-08-10 DIAGNOSIS — Z8739 Personal history of other diseases of the musculoskeletal system and connective tissue: Secondary | ICD-10-CM | POA: Diagnosis not present

## 2012-08-10 DIAGNOSIS — M549 Dorsalgia, unspecified: Secondary | ICD-10-CM | POA: Insufficient documentation

## 2012-08-10 DIAGNOSIS — Z7982 Long term (current) use of aspirin: Secondary | ICD-10-CM | POA: Insufficient documentation

## 2012-08-10 DIAGNOSIS — Z79899 Other long term (current) drug therapy: Secondary | ICD-10-CM | POA: Insufficient documentation

## 2012-08-10 DIAGNOSIS — Z87891 Personal history of nicotine dependence: Secondary | ICD-10-CM | POA: Diagnosis not present

## 2012-08-10 DIAGNOSIS — B351 Tinea unguium: Secondary | ICD-10-CM

## 2012-08-10 DIAGNOSIS — L089 Local infection of the skin and subcutaneous tissue, unspecified: Secondary | ICD-10-CM | POA: Diagnosis not present

## 2012-08-10 DIAGNOSIS — K219 Gastro-esophageal reflux disease without esophagitis: Secondary | ICD-10-CM | POA: Insufficient documentation

## 2012-08-10 DIAGNOSIS — J45909 Unspecified asthma, uncomplicated: Secondary | ICD-10-CM | POA: Insufficient documentation

## 2012-08-10 DIAGNOSIS — L03039 Cellulitis of unspecified toe: Secondary | ICD-10-CM | POA: Diagnosis not present

## 2012-08-10 DIAGNOSIS — Z791 Long term (current) use of non-steroidal anti-inflammatories (NSAID): Secondary | ICD-10-CM | POA: Insufficient documentation

## 2012-08-10 DIAGNOSIS — G8929 Other chronic pain: Secondary | ICD-10-CM | POA: Diagnosis not present

## 2012-08-10 DIAGNOSIS — Z872 Personal history of diseases of the skin and subcutaneous tissue: Secondary | ICD-10-CM | POA: Insufficient documentation

## 2012-08-10 MED ORDER — DOXYCYCLINE HYCLATE 100 MG PO CAPS: 100.0000 mg | ORAL_CAPSULE | Freq: Two times a day (BID) | ORAL | Status: DC

## 2012-08-10 MED ORDER — CLOTRIMAZOLE 1 % EX CREA: TOPICAL_CREAM | CUTANEOUS | Status: DC

## 2012-08-10 NOTE — Discharge Instructions (Signed)
Skin Infections A skin infection usually develops as a result of disruption of the skin barrier.  CAUSES  A skin infection might occur following:  Trauma or an injury to the skin such as a cut or insect sting.  Inflammation (as in eczema).  Breaks in the skin between the toes (as in athlete's foot).  Swelling (edema). SYMPTOMS  The legs are the most common site affected. Usually there is:  Redness.  Swelling.  Pain.  There may be red streaks in the area of the infection. TREATMENT   Minor skin infections may be treated with topical antibiotics, but if the skin infection is severe, hospital care and intravenous (IV) antibiotic treatment may be needed.  Most often skin infections can be treated with oral antibiotic medicine as well as proper rest and elevation of the affected area until the infection improves.  If you are prescribed oral antibiotics, it is important to take them as directed and to take all the pills even if you feel better before you have finished all of the medicine.  You may apply warm compresses to the area for 20-30 minutes 4 times daily. You might need a tetanus shot now if:  You have no idea when you had the last one.  You have never had a tetanus shot before.  Your wound had dirt in it. If you need a tetanus shot and you decide not to get one, there is a rare chance of getting tetanus. Sickness from tetanus can be serious. If you get a tetanus shot, your arm may swell and become red and warm at the shot site. This is common and not a problem. SEEK MEDICAL CARE IF:  The pain and swelling from your infection do not improve within 2 days.  SEEK IMMEDIATE MEDICAL CARE IF:  You develop a fever, chills, or other serious problems.  Document Released: 07/26/2004 Document Revised: 09/10/2011 Document Reviewed: 06/07/2008 [facility] Patient Information 2013 [place], [place].  Ringworm, Nail A fungal infection of the nail (tinea unguium/onychomycosis) is common.  It is common as the visible part of the nail is composed of dead cells which have no blood supply to help prevent infection. It occurs because fungi are everywhere and will pick any opportunity to grow on any dead material. Because nails are very slow growing they require up to 2 years of treatment with anti-fungal medications. The entire nail back to the base is infected. This includes approximately  of the nail which you cannot see. If your caregiver has prescribed a medication by mouth, take it every day and as directed. No progress will be seen for at least 6 to 9 months. Do not be disappointed! Because fungi live on dead cells with little or no exposure to blood supply, medication delivery to the infection is slow; thus the cure is slow. It is also why you can observe no progress in the first 6 months. The nail becoming cured is the base of the nail, as it has the blood supply. Topical medication such as creams and ointments are usually not effective. Important in successful treatment of nail fungus is closely following the medication regimen that your doctor prescribes. Sometimes you and your caregiver may elect to speed up this process by surgical removal of all the nails. Even this may still require 6 to 9 months of additional oral medications. See your caregiver as directed. Remember there will be no visible improvement for at least 6 months. See your caregiver sooner if other signs of infection (  redness and swelling) develop. Document Released: 06/15/2000 Document Revised: 09/10/2011 Document Reviewed: 08/24/2008 [facility] Patient Information 2013 [place], [place].    As discussed,  I recommend washing your foot in mild soap and water,  And using a warm epsom salt soak for 10 minutes twice daily.  Use the antibiotic until gone.  Apply the topical antifungal medicine as prescribed.  Call your doctor for a recheck this week.  As discussed,  You may need an oral antifungal treatment for the next  several months  To resolve the fungal infection beneath this toenail.  Please talk to your doctor about this possible treatment.
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
## 2012-08-10 NOTE — ED Notes (Signed)
Pt reports right foot 4th toe pain pain and itching x 3days.

## 2012-08-11 NOTE — ED Provider Notes (Signed)
Medical screening examination/treatment/procedure(s) were performed by non-physician practitioner and as supervising physician I was immediately available for consultation/collaboration.   Anastasios Melander L Kelsye Loomer, MD 08/11/12 0706 

## 2012-09-03 DIAGNOSIS — M25569 Pain in unspecified knee: Secondary | ICD-10-CM | POA: Diagnosis not present

## 2012-09-04 ENCOUNTER — Ambulatory Visit (HOSPITAL_COMMUNITY)
Admission: RE | Admit: 2012-09-04 | Discharge: 2012-09-04 | Disposition: A | Discharge status: Home / Self Care | Payer: Medicare Other | Source: Ambulatory Visit | Attending: Family Medicine | Admitting: Family Medicine

## 2012-09-04 ENCOUNTER — Other Ambulatory Visit (HOSPITAL_COMMUNITY): Payer: Self-pay | Admitting: Family Medicine

## 2012-09-04 DIAGNOSIS — M1710 Unilateral primary osteoarthritis, unspecified knee: Secondary | ICD-10-CM | POA: Diagnosis not present

## 2012-09-04 DIAGNOSIS — M25562 Pain in left knee: Secondary | ICD-10-CM

## 2012-09-04 DIAGNOSIS — M25569 Pain in unspecified knee: Secondary | ICD-10-CM | POA: Diagnosis not present

## 2012-10-03 DIAGNOSIS — M25569 Pain in unspecified knee: Secondary | ICD-10-CM | POA: Diagnosis not present

## 2012-11-20 DIAGNOSIS — R7309 Other abnormal glucose: Secondary | ICD-10-CM | POA: Diagnosis not present

## 2012-11-20 DIAGNOSIS — E785 Hyperlipidemia, unspecified: Secondary | ICD-10-CM | POA: Diagnosis not present

## 2012-11-20 DIAGNOSIS — R9431 Abnormal electrocardiogram [ECG] [EKG]: Secondary | ICD-10-CM | POA: Diagnosis not present

## 2012-12-02 DIAGNOSIS — E119 Type 2 diabetes mellitus without complications: Secondary | ICD-10-CM | POA: Diagnosis not present

## 2012-12-02 DIAGNOSIS — E785 Hyperlipidemia, unspecified: Secondary | ICD-10-CM | POA: Diagnosis not present

## 2012-12-02 DIAGNOSIS — M549 Dorsalgia, unspecified: Secondary | ICD-10-CM | POA: Diagnosis not present

## 2012-12-02 DIAGNOSIS — H6120 Impacted cerumen, unspecified ear: Secondary | ICD-10-CM | POA: Diagnosis not present

## 2012-12-23 ENCOUNTER — Inpatient Hospital Stay
Admit: 2012-12-23 | Discharge: 2012-12-23 | Disposition: A | Discharge status: Home / Self Care | Attending: Emergency Medicine

## 2012-12-23 MED ORDER — ACETAMINOPHEN-CODEINE 300-30 MG PO TABS
300-30 MG | ORAL_TABLET | ORAL | Status: DC
Start: 2012-12-23 — End: 2013-09-04

## 2012-12-23 MED ORDER — CYCLOBENZAPRINE HCL 10 MG PO TABS
10 MG | ORAL_TABLET | ORAL | Status: AC
Start: 2012-12-23 — End: 2013-01-02

## 2012-12-23 MED ORDER — NAPROXEN SODIUM 550 MG PO TABS
550 MG | ORAL_TABLET | ORAL | Status: DC
Start: 2012-12-23 — End: 2013-09-04

## 2012-12-23 MED ADMIN — orphenadrine (NORFLEX) injection 60 mg: INTRAMUSCULAR | @ 12:00:00 | NDC 25021065102

## 2012-12-23 MED ADMIN — ketorolac (TORADOL) injection 60 mg: INTRAMUSCULAR | @ 12:00:00 | NDC 00409379601

## 2012-12-23 MED FILL — ORPHENADRINE CITRATE 30 MG/ML IJ SOLN: 30 MG/ML | INTRAMUSCULAR | Qty: 2

## 2012-12-23 MED FILL — KETOROLAC TROMETHAMINE 60 MG/2ML IM SOLN: 60 MG/2ML | INTRAMUSCULAR | Qty: 2

## 2012-12-23 NOTE — ED Notes (Signed)
Discharged home with instructions verbalized understanding of instructions     Bonnell Public, RN  12/23/12 [phone]

## 2012-12-23 NOTE — ED Notes (Addendum)
Pt back from radiology.    Herschel Fleagle S. Theda Sers, RN  12/23/12 0700    Audryana Hockenberry S. Theda Sers, RN  12/23/12 0700

## 2012-12-23 NOTE — ED Notes (Signed)
Dr notified of pt complaint and arrival.    Mark Buchanan. Theda Sers, RN  12/23/12 0700

## 2012-12-23 NOTE — ED Provider Notes (Signed)
HPI chief complaint mid back pain.  This 48 year old male presents to ER complaining the onset of lower thoracic paravertebral back pain x1 week.  He states he awoke with this but denies any known injury.  He also denies any history of previous back problems or radiation of pain.  He states the pain is worse with movement or coughing.  He does roofing work every day and maybe making the pain worse.    Review of Systems otherwise negative except as pertaining to history of present illness.    Physical Exam   Nursing note and vitals reviewed.  Constitutional: He is oriented to person, place, and time. He appears well-developed and well-nourished.   HENT:   Head: Normocephalic.   Neck: Normal range of motion. Neck supple.   Cardiovascular: Normal rate, regular rhythm and normal heart sounds.    No murmur heard.  Pulmonary/Chest: Effort normal and breath sounds normal. No respiratory distress. He has no wheezes. He has no rales.   Abdominal: Soft. Bowel sounds are normal. There is no tenderness. There is no rebound and no guarding.   Musculoskeletal:   Patient points to the lower thoracic paravertebral area as the area of pain.  However there is no pain with palpation to this area and the patient states the pain is deeper.  Pain is increased with attempts at forward bending, backward bending, side bending or rotation.  There is no obvious edema, ecchymosis or deformity.   Neurological: He is alert and oriented to person, place, and time.   Skin: Skin is warm and dry. No rash noted.   Psychiatric: He has a normal mood and affect.       Procedures    MDM    Labs      Radiology      EKG Interpretation.    Diagnosis:  #1.  Lower thoracic paravertebral muscle strain    Rica Mast, DO  12/29/12 1056

## 2012-12-23 NOTE — Discharge Instructions (Signed)
Thoracic Strain  You have injured the muscles or tendons that attach to the upper part of your back behind your chest. This injury is called a thoracic strain, thoracic sprain, or mid-back strain.   CAUSES   The cause of thoracic strain varies. A less severe injury involves pulling a muscle or tendon without tearing it. A more severe injury involves tearing (rupturing) a muscle or tendon. With less severe injuries, there may be little loss of strength. Sometimes, there are breaks (fractures) in the bones to which the muscles are attached. These fractures are rare, unless there was a direct hit (trauma) or you have weak bones due to osteoporosis or age. Longstanding strains may be caused by overuse or improper form during certain movements. Obesity can also increase your risk for back injuries. Sudden strains may occur due to injury or not warming up properly before exercise. Often, there is no obvious cause for a thoracic strain.  SYMPTOMS   The main symptom is pain, especially with movement, such as during exercise.  DIAGNOSIS   Your caregiver can usually tell what is wrong by taking an X-ray and doing a physical exam.  TREATMENT    Physical therapy may be helpful for recovery. Your caregiver can give you exercises to do or refer you to a physical therapist after your pain improves.   After your pain improves, strengthening and conditioning programs appropriate for your sport or occupation may be helpful.   Always warm up before physical activities or athletics. Stretching after physical activity may also help.   Certain over-the-counter medicines may also help. Ask your caregiver if there are medicines that would help you.  If this is your first thoracic strain injury, proper care and proper healing time before starting activities should prevent long-term problems. Torn ligaments and tendons require as long to heal as broken bones. Average healing times may be only 1 week for a mild strain. For torn muscles  and tendons, healing time may be up to 6 weeks to 2 months.  HOME CARE INSTRUCTIONS    Apply ice to the injured area. Ice massages may also be used as directed.   Put ice in a plastic bag.   Place a towel between your skin and the bag.   Leave the ice on for 15-20 minutes, 3-4 times a day, for the first 2 days.   Only take over-the-counter or prescription medicines for pain, discomfort, or fever as directed by your caregiver.   Keep your appointments for physical therapy if this was prescribed.   Use wraps and back braces as instructed.  SEEK IMMEDIATE MEDICAL CARE IF:    You have an increase in bruising, swelling, or pain.   Your pain has not improved with medicines.   You develop new shortness of breath, chest pain, or fever.   Problems seem to be getting worse rather than better.  MAKE SURE YOU:    Understand these instructions.   Will watch your condition.   Will get help right away if you are not doing well or get worse.  Document Released: 09/08/2003 Document Revised: 09/10/2011 Document Reviewed: 08/04/2010  [facility] Patient Information 2014 [place].

## 2013-01-29 DIAGNOSIS — F79 Unspecified intellectual disabilities: Secondary | ICD-10-CM | POA: Diagnosis not present

## 2013-01-29 DIAGNOSIS — M549 Dorsalgia, unspecified: Secondary | ICD-10-CM | POA: Diagnosis not present

## 2013-01-29 DIAGNOSIS — K219 Gastro-esophageal reflux disease without esophagitis: Secondary | ICD-10-CM | POA: Diagnosis not present
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
## 2013-03-31 DIAGNOSIS — M539 Dorsopathy, unspecified: Secondary | ICD-10-CM | POA: Diagnosis not present

## 2013-04-13 ENCOUNTER — Encounter (HOSPITAL_COMMUNITY): Payer: Self-pay | Admitting: Emergency Medicine

## 2013-04-13 ENCOUNTER — Emergency Department (HOSPITAL_COMMUNITY): Payer: Medicare Other

## 2013-04-13 ENCOUNTER — Emergency Department (HOSPITAL_COMMUNITY)
Admission: EM | Admit: 2013-04-13 | Discharge: 2013-04-13 | Disposition: A | Discharge status: Home / Self Care | Payer: Medicare Other | Attending: Emergency Medicine | Admitting: Emergency Medicine

## 2013-04-13 DIAGNOSIS — M129 Arthropathy, unspecified: Secondary | ICD-10-CM | POA: Insufficient documentation

## 2013-04-13 DIAGNOSIS — Z87891 Personal history of nicotine dependence: Secondary | ICD-10-CM | POA: Insufficient documentation

## 2013-04-13 DIAGNOSIS — R52 Pain, unspecified: Secondary | ICD-10-CM | POA: Insufficient documentation

## 2013-04-13 DIAGNOSIS — Z792 Long term (current) use of antibiotics: Secondary | ICD-10-CM | POA: Insufficient documentation

## 2013-04-13 DIAGNOSIS — J45901 Unspecified asthma with (acute) exacerbation: Secondary | ICD-10-CM | POA: Insufficient documentation

## 2013-04-13 DIAGNOSIS — K219 Gastro-esophageal reflux disease without esophagitis: Secondary | ICD-10-CM | POA: Diagnosis not present

## 2013-04-13 DIAGNOSIS — R05 Cough: Secondary | ICD-10-CM | POA: Diagnosis not present

## 2013-04-13 DIAGNOSIS — Z79899 Other long term (current) drug therapy: Secondary | ICD-10-CM | POA: Diagnosis not present

## 2013-04-13 DIAGNOSIS — G8929 Other chronic pain: Secondary | ICD-10-CM | POA: Insufficient documentation

## 2013-04-13 DIAGNOSIS — J159 Unspecified bacterial pneumonia: Secondary | ICD-10-CM | POA: Insufficient documentation

## 2013-04-13 DIAGNOSIS — Z872 Personal history of diseases of the skin and subcutaneous tissue: Secondary | ICD-10-CM | POA: Diagnosis not present

## 2013-04-13 DIAGNOSIS — J189 Pneumonia, unspecified organism: Secondary | ICD-10-CM | POA: Diagnosis not present

## 2013-04-13 MED ORDER — PROMETHAZINE-CODEINE 6.25-10 MG/5ML PO SYRP: 10.0000 mL | ORAL_SOLUTION | Freq: Four times a day (QID) | ORAL | Status: DC | PRN

## 2013-04-13 MED ORDER — AMOXICILLIN-POT CLAVULANATE 875-125 MG PO TABS
1.0000 | ORAL_TABLET | Freq: Once | ORAL | Status: AC
  Administered 2013-04-13: 1 via ORAL
  Filled 2013-04-13: qty 1

## 2013-04-13 MED ORDER — AMOXICILLIN 500 MG PO CAPS: 500.0000 mg | ORAL_CAPSULE | Freq: Three times a day (TID) | ORAL | Status: DC

## 2013-04-13 MED ORDER — ALBUTEROL SULFATE HFA 108 (90 BASE) MCG/ACT IN AERS
2.0000 | INHALATION_SPRAY | RESPIRATORY_TRACT | Status: DC | PRN
  Administered 2013-04-13: 2 via RESPIRATORY_TRACT
  Filled 2013-04-13: qty 6.7

## 2013-04-13 MED ORDER — AZITHROMYCIN 250 MG PO TABS: ORAL_TABLET | ORAL | Status: DC

## 2013-04-13 MED ORDER — AZITHROMYCIN 250 MG PO TABS
500.0000 mg | ORAL_TABLET | Freq: Once | ORAL | Status: AC
  Administered 2013-04-13: 500 mg via ORAL
  Filled 2013-04-13: qty 2
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
## 2013-04-13 NOTE — ED Notes (Signed)
Pt reports cough and congestion for 1 week, body aches started today. Dry cough. Has been taking otc cough syrup.

## 2013-04-16 NOTE — ED Provider Notes (Signed)
Medical screening examination/treatment/procedure(s) were performed by non-physician practitioner and as supervising physician I was immediately available for consultation/collaboration.   Yaxiel Minnie W Zori Benbrook, MD 04/16/13 0826 

## 2013-04-17 DIAGNOSIS — J9801 Acute bronchospasm: Secondary | ICD-10-CM | POA: Diagnosis not present

## 2013-04-21 ENCOUNTER — Emergency Department (HOSPITAL_COMMUNITY)
Admission: EM | Admit: 2013-04-21 | Discharge: 2013-04-21 | Disposition: A | Discharge status: Home / Self Care | Payer: Medicare Other | Attending: Emergency Medicine | Admitting: Emergency Medicine

## 2013-04-21 ENCOUNTER — Emergency Department (HOSPITAL_COMMUNITY): Payer: Medicare Other

## 2013-04-21 ENCOUNTER — Encounter (HOSPITAL_COMMUNITY): Payer: Self-pay | Admitting: Emergency Medicine

## 2013-04-21 DIAGNOSIS — J45909 Unspecified asthma, uncomplicated: Secondary | ICD-10-CM | POA: Diagnosis not present

## 2013-04-21 DIAGNOSIS — K219 Gastro-esophageal reflux disease without esophagitis: Secondary | ICD-10-CM | POA: Insufficient documentation

## 2013-04-21 DIAGNOSIS — Z7982 Long term (current) use of aspirin: Secondary | ICD-10-CM | POA: Insufficient documentation

## 2013-04-21 DIAGNOSIS — Z8701 Personal history of pneumonia (recurrent): Secondary | ICD-10-CM | POA: Diagnosis not present

## 2013-04-21 DIAGNOSIS — R079 Chest pain, unspecified: Secondary | ICD-10-CM | POA: Diagnosis not present

## 2013-04-21 DIAGNOSIS — Z872 Personal history of diseases of the skin and subcutaneous tissue: Secondary | ICD-10-CM | POA: Insufficient documentation

## 2013-04-21 DIAGNOSIS — M542 Cervicalgia: Secondary | ICD-10-CM | POA: Insufficient documentation

## 2013-04-21 DIAGNOSIS — R071 Chest pain on breathing: Secondary | ICD-10-CM | POA: Insufficient documentation

## 2013-04-21 DIAGNOSIS — R059 Cough, unspecified: Secondary | ICD-10-CM | POA: Insufficient documentation

## 2013-04-21 DIAGNOSIS — M549 Dorsalgia, unspecified: Secondary | ICD-10-CM | POA: Diagnosis not present

## 2013-04-21 DIAGNOSIS — Z87891 Personal history of nicotine dependence: Secondary | ICD-10-CM | POA: Insufficient documentation

## 2013-04-21 DIAGNOSIS — Z79899 Other long term (current) drug therapy: Secondary | ICD-10-CM | POA: Diagnosis not present

## 2013-04-21 DIAGNOSIS — R05 Cough: Secondary | ICD-10-CM | POA: Insufficient documentation

## 2013-04-21 DIAGNOSIS — G8929 Other chronic pain: Secondary | ICD-10-CM | POA: Diagnosis not present

## 2013-04-21 DIAGNOSIS — M129 Arthropathy, unspecified: Secondary | ICD-10-CM | POA: Diagnosis not present

## 2013-04-21 DIAGNOSIS — R0789 Other chest pain: Secondary | ICD-10-CM

## 2013-04-21 HISTORY — DX: Pneumonia, unspecified organism: J18.9

## 2013-04-21 LAB — BASIC METABOLIC PANEL
CO2: 28 mEq/L (ref 19–32)
Chloride: 101 mEq/L (ref 96–112)
Glucose, Bld: 121 mg/dL — ABNORMAL HIGH (ref 70–99)
Sodium: 138 mEq/L (ref 135–145)

## 2013-04-21 LAB — CBC
Hemoglobin: 13 g/dL (ref 13.0–17.0)
MCH: 27.5 pg (ref 26.0–34.0)
MCV: 84.3 fL (ref 78.0–100.0)
Platelets: 207 10*3/uL (ref 150–400)
RBC: 4.72 MIL/uL (ref 4.22–5.81)
WBC: 4 10*3/uL (ref 4.0–10.5)

## 2013-04-21 LAB — TROPONIN I
Troponin I: <0.3 ng/mL (ref <0.30)
Troponin I: <0.3 ng/mL (ref <0.30)

## 2013-04-21 LAB — D-DIMER, QUANTITATIVE: D-Dimer, Quant: 0.29 ug/mL-FEU (ref 0.00–0.48)

## 2013-04-21 MED ORDER — ACETAMINOPHEN-CODEINE #3 300-30 MG PO TABS: 1.0000 | ORAL_TABLET | Freq: Four times a day (QID) | ORAL | Status: DC | PRN

## 2013-04-21 MED ORDER — METHOCARBAMOL 500 MG PO TABS: 500.0000 mg | ORAL_TABLET | Freq: Three times a day (TID) | ORAL | Status: DC

## 2013-04-21 MED ORDER — PROMETHAZINE HCL 12.5 MG PO TABS
12.5000 mg | ORAL_TABLET | Freq: Once | ORAL | Status: AC
  Administered 2013-04-21: 12.5 mg via ORAL
  Filled 2013-04-21: qty 1

## 2013-04-21 MED ORDER — FAMOTIDINE 20 MG PO TABS
20.0000 mg | ORAL_TABLET | Freq: Once | ORAL | Status: AC
  Administered 2013-04-21: 20 mg via ORAL
  Filled 2013-04-21: qty 1

## 2013-04-21 MED ORDER — ASPIRIN 81 MG PO CHEW
324.0000 mg | CHEWABLE_TABLET | Freq: Once | ORAL | Status: AC
  Administered 2013-04-21: 324 mg via ORAL
  Filled 2013-04-21: qty 4

## 2013-04-21 MED ORDER — ACETAMINOPHEN-CODEINE #3 300-30 MG PO TABS
2.0000 | ORAL_TABLET | Freq: Once | ORAL | Status: AC
  Administered 2013-04-21: 2 via ORAL
  Filled 2013-04-21: qty 2

## 2013-04-21 MED ORDER — FAMOTIDINE 20 MG PO TABS: 20.0000 mg | ORAL_TABLET | Freq: Two times a day (BID) | ORAL | Status: DC

## 2013-04-21 NOTE — ED Notes (Signed)
Patient had sudden burning pain in L lateral neck at about 0130 this morning while awake.  Pain stopped in neck and returned in L midsternum lasting a few minutes. Denies n/v, diaphoresis or dizziness except when coughing.  He is asthmatic and recently treated for PNA.  Takin cough medication and inhaler.
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
## 2013-04-22 NOTE — ED Provider Notes (Signed)
Medical screening examination/treatment/procedure(s) were performed by non-physician practitioner and as supervising physician I was immediately available for consultation/collaboration.  EKG Interpretation     Ventricular Rate:  74 PR Interval:  160 QRS Duration: 104 QT Interval:  386 QTC Calculation: 428 R Axis:   65 Text Interpretation:  Normal sinus rhythm Normal ECG When compared with ECG of 21-Sep-2011 08:37, No significant change was found              Layla Maw Zvi Duplantis, DO 04/22/13 0715

## 2013-04-27 DIAGNOSIS — J189 Pneumonia, unspecified organism: Secondary | ICD-10-CM | POA: Diagnosis not present

## 2013-08-24 DIAGNOSIS — R209 Unspecified disturbances of skin sensation: Secondary | ICD-10-CM | POA: Diagnosis not present

## 2013-09-04 NOTE — Progress Notes (Signed)
Subjective:      Patient ID: Mark Buchanan is a 48 y.o. male.    Gastroesophageal Reflux  He complains of heartburn. He reports no abdominal pain, no chest pain, no choking, no coughing, no nausea, no sore throat or no wheezing. This is a recurrent problem. The current episode started more than 1 year ago. The problem occurs occasionally. The problem has been waxing and waning. The heartburn duration is less than a minute. The heartburn is located in the substernum. The heartburn is of mild intensity. The heartburn does not wake him from sleep. The heartburn does not limit his activity. The heartburn doesn't change with position. The symptoms are aggravated by smoking and certain foods. Pertinent negatives include no anemia, fatigue, melena, muscle weakness, orthopnea or weight loss. Risk factors include smoking/tobacco exposure. He has tried an antacid for the symptoms. The treatment provided significant relief. Past procedures do not include an abdominal ultrasound, an EGD, esophageal manometry, esophageal pH monitoring, H. pylori antibody titer or a UGI. Past invasive treatments do not include gastroplasty, gastroplication or reflux surgery.       Review of Systems   Constitutional: Negative.  Negative for fever, chills, weight loss, diaphoresis, activity change, appetite change, fatigue and unexpected weight change.   HENT: Negative.  Negative for congestion, dental problem, drooling, ear discharge, ear pain, facial swelling, hearing loss, mouth sores, nosebleeds, postnasal drip, rhinorrhea, sinus pressure, sneezing, sore throat, tinnitus, trouble swallowing and voice change.    Eyes: Negative.  Negative for photophobia, pain, discharge, redness, itching and visual disturbance.   Respiratory: Negative.  Negative for apnea, cough, choking, chest tightness, shortness of breath, wheezing and stridor.    Cardiovascular: Negative.  Negative for chest pain, palpitations and leg swelling.   Gastrointestinal:  Positive for heartburn. Negative for nausea, vomiting, abdominal pain, diarrhea, constipation, blood in stool, melena, abdominal distention, anal bleeding and rectal pain.   Endocrine: Negative.  Negative for cold intolerance, heat intolerance, polydipsia, polyphagia and polyuria.   Genitourinary: Negative.  Negative for dysuria, urgency, frequency, hematuria, flank pain, decreased urine volume, discharge, penile swelling, scrotal swelling, enuresis, difficulty urinating, genital sores, penile pain and testicular pain.   Musculoskeletal: Negative.  Negative for myalgias, back pain, joint swelling, arthralgias, gait problem, muscle weakness, neck pain and neck stiffness.   Skin: Negative.  Negative for color change, pallor, rash and wound.   Allergic/Immunologic: Negative.  Negative for environmental allergies, food allergies and immunocompromised state.   Neurological: Negative.  Negative for dizziness, tremors, seizures, syncope, facial asymmetry, speech difficulty, weakness, light-headedness, numbness and headaches.   Hematological: Negative.  Negative for adenopathy. Does not bruise/bleed easily.   Psychiatric/Behavioral: Negative.  Negative for suicidal ideas, hallucinations, behavioral problems, confusion, sleep disturbance, self-injury, dysphoric mood, decreased concentration and agitation. The patient is not nervous/anxious and is not hyperactive.    All other systems reviewed and are negative.        Objective:   Physical Exam   Constitutional: He is oriented to person, place, and time. He appears well-developed and well-nourished.   HENT:   Head: Normocephalic and atraumatic.   Neck: Normal range of motion. Neck supple.   Cardiovascular: Normal rate, regular rhythm and normal heart sounds.    No murmur heard.  Pulmonary/Chest: Effort normal and breath sounds normal. He has no wheezes. He has no rales.   Abdominal: Soft. Bowel sounds are normal.   Musculoskeletal: Normal range of motion. He exhibits no edema  or tenderness.   Neurological: He is  alert and oriented to person, place, and time.   Skin: Skin is warm.   Psychiatric: He has a normal mood and affect.   Nursing note and vitals reviewed.      Assessment:      1. GERD (gastroesophageal reflux disease)     2. Nicotine addiction     3. Health care maintenance  Lipid Panel    Basic Metabolic Panel            Plan:    continue OTC anti acid medicine;ordered BMP and lipid profile;follow up as needed.  [place] received counseling on the following healthy behaviors: nutrition, exercise, medication adherence and tobacco cessation    Patient given educational materials on GERD    Was a self-tracking handout given in paper form or via MyChart? No  If yes, see orders or list here.    Discussed use, benefit, and side effects of prescribed medications.  Barriers to medication compliance addressed.  All patient questions answered.  Pt voiced understanding.

## 2013-09-04 NOTE — Progress Notes (Signed)
Have you seen any other physician or provider since your last visit no    Have you had any other diagnostic tests since your last visit? no    Have you changed or stopped any medications since your last visit including any over-the-counter medicines, vitamins, or herbal medicines? no     Are you taking all your prescribed medications? No  If NO, why? -  N/A           Patient Self-Management Goal for this visit.   What is your goal for your visit today? - check up   Barriers to success: none   Plan for overcoming my barriers: N/A      Confidence: 10/10   Date goal set: 09/04/2013   Date expected to reach goal:       Health Maintenance Due   Topic Date Due   ??? TETANUS VACCINE ADULT (11 YEARS AND UP)  02/22/1977   ??? FLU VACCINE YEARLY (ADULT)  03/02/2013

## 2013-09-16 LAB — POCT RAPID STREP A: Strep A Ag: NOT DETECTED

## 2013-09-16 MED ORDER — VARENICLINE TARTRATE 0.5 MG X 11 & 1 MG X 42 PO MISC
0.5 MG X 11 & 1 MG X 42 | ORAL_TABLET | ORAL | Status: DC
Start: 2013-09-16 — End: 2014-02-11

## 2013-09-16 MED ORDER — GUAIFENESIN-CODEINE 100-10 MG/5ML PO SYRP
100-10 MG/5ML | Freq: Two times a day (BID) | ORAL | Status: AC | PRN
Start: 2013-09-16 — End: 2013-09-23

## 2013-09-16 MED ORDER — LIDOCAINE VISCOUS 2 % MT SOLN
2 % | OROMUCOSAL | Status: DC
Start: 2013-09-16 — End: 2014-02-11

## 2013-09-16 NOTE — Progress Notes (Signed)
Subjective:      Patient ID: Mark Buchanan is a 48 y.o. male.    Pharyngitis  This is a new problem. The current episode started in the past 7 days. The problem occurs constantly. The problem has been unchanged. Associated symptoms include congestion, coughing and a sore throat. Pertinent negatives include no abdominal pain, anorexia, arthralgias, chest pain, chills, diaphoresis, fatigue, fever, headaches, joint swelling, myalgias, nausea, neck pain, numbness, rash, swollen glands, vertigo, vomiting or weakness. Nothing aggravates the symptoms. He has tried nothing for the symptoms.   Cough  This is a new problem. The current episode started in the past 7 days. The problem has been unchanged. The problem occurs every few minutes. The cough is non-productive. Associated symptoms include a sore throat. Pertinent negatives include no chest pain, chills, ear congestion, ear pain, eye redness, fever, headaches, myalgias, nasal congestion, postnasal drip, rash, rhinorrhea, shortness of breath, weight loss or wheezing. Nothing aggravates the symptoms. Risk factors for lung disease include smoking/tobacco exposure. He has tried OTC cough suppressant for the symptoms. The treatment provided no relief. There is no history of asthma, bronchiectasis, bronchitis, COPD, emphysema, environmental allergies or pneumonia.       Review of Systems   Constitutional: Negative.  Negative for fever, chills, weight loss, diaphoresis, activity change, appetite change, fatigue and unexpected weight change.   HENT: Positive for congestion and sore throat. Negative for dental problem, drooling, ear discharge, ear pain, facial swelling, hearing loss, mouth sores, nosebleeds, postnasal drip, rhinorrhea, sinus pressure, sneezing, tinnitus, trouble swallowing and voice change.    Eyes: Negative.  Negative for photophobia, pain, discharge, redness, itching and visual disturbance.   Respiratory: Positive for cough. Negative for apnea, choking,  chest tightness, shortness of breath, wheezing and stridor.    Cardiovascular: Negative.  Negative for chest pain, palpitations and leg swelling.   Gastrointestinal: Negative.  Negative for nausea, vomiting, abdominal pain, diarrhea, constipation, blood in stool, abdominal distention, anal bleeding, rectal pain and anorexia.   Endocrine: Negative.  Negative for cold intolerance, heat intolerance, polydipsia, polyphagia and polyuria.   Genitourinary: Negative.  Negative for dysuria, urgency, frequency, hematuria, flank pain, decreased urine volume, discharge, penile swelling, scrotal swelling, enuresis, difficulty urinating, genital sores, penile pain and testicular pain.   Musculoskeletal: Negative.  Negative for myalgias, back pain, joint swelling, arthralgias, gait problem, neck pain and neck stiffness.   Skin: Negative.  Negative for color change, pallor, rash and wound.   Allergic/Immunologic: Negative.  Negative for environmental allergies, food allergies and immunocompromised state.   Neurological: Negative.  Negative for dizziness, vertigo, tremors, seizures, syncope, facial asymmetry, speech difficulty, weakness, light-headedness, numbness and headaches.   Hematological: Negative.  Negative for adenopathy. Does not bruise/bleed easily.   Psychiatric/Behavioral: Negative.  Negative for suicidal ideas, hallucinations, behavioral problems, confusion, sleep disturbance, self-injury, dysphoric mood, decreased concentration and agitation. The patient is not nervous/anxious and is not hyperactive.    All other systems reviewed and are negative.      Objective:   Physical Exam   Constitutional: He is oriented to person, place, and time. He appears well-developed and well-nourished.   HENT:   Head: Normocephalic and atraumatic.   Right Ear: External ear normal.   Left Ear: External ear normal.   Nose: Nose normal.   Mouth/Throat: Oropharynx is clear and moist. No oropharyngeal exudate.   Rapid strep test was  negative.   Neck: Normal range of motion. Neck supple.   Cardiovascular: Normal rate and normal heart sounds.  Pulmonary/Chest: Effort normal and breath sounds normal. He has no wheezes. He has no rales.   Abdominal: Soft. Bowel sounds are normal.   Musculoskeletal: Normal range of motion.   Neurological: He is alert and oriented to person, place, and time.   Skin: Skin is warm.   Psychiatric: He has a normal mood and affect.   Nursing note and vitals reviewed.      Assessment:      1. Cough  guaiFENesin-codeine (TUSSI-ORGANIDIN NR) 100-10 MG/5ML syrup   2. Pharyngitis  lidocaine viscous (XYLOCAINE) 2 % solution   3. Chest congestion     4. Nicotine addiction  varenicline (CHANTIX STARTING MONTH PAK) 0.5 MG X 11 & 1 MG X 42 tablet            Plan:      .conservative,supportive and symptomatic mgt;rest,increase oral fluid intake;prescribed Lidocaine viscous solution as directed;Robitussin with codeine as directed;prescribed Chantix to help him quit smoking;discussed potential side effects;follow up or call office after 1 month for refill of Chantix maintenance doses for another 2 months or follow up as needed.

## 2013-09-16 NOTE — Addendum Note (Signed)
Addended by: Dorcas McmurrayHOMPSON, Bradyn Vassey M on: 09/16/2013 12:11 PM     Modules accepted: Orders

## 2013-09-16 NOTE — Progress Notes (Signed)
Have you seen any other physician or provider since your last visit no    Have you had any other diagnostic tests since your last visit? no    Have you changed or stopped any medications since your last visit including any over-the-counter medicines, vitamins, or herbal medicines? no     Are you taking all your prescribed medications? No  If NO, why? -  N/A           Patient Self-Management Goal for this visit.   What is your goal for your visit today? - sick visit   Barriers to success: none   Plan for overcoming my barriers: N/A      Confidence: 10/10   Date goal set: 09/16/2013   Date expected to reach goal:       Health Maintenance Due   Topic Date Due   ??? TETANUS VACCINE ADULT (11 YEARS AND UP)  02/22/1975   ??? FLU VACCINE YEARLY (ADULT)  03/02/2013

## 2013-09-18 LAB — LIPID PANEL
Chol/HDL Ratio: 3.4 (ref <5)
Cholesterol: 96 mg/dL (ref <200)
HDL: 28 mg/dL — ABNORMAL LOW (ref >40)
LDL Cholesterol: 48 mg/dL (ref 0–130)
Triglycerides: 101 mg/dL (ref <150)
VLDL: 20 mg/dL (ref 1–30)

## 2013-09-18 LAB — BASIC METABOLIC PANEL
Anion Gap: 14 mmol/L (ref 8–16)
BUN: 11 mg/dL (ref 6–20)
Bun/Cre Ratio: 9 (ref 9–20)
CO2: 28 mmol/L (ref 20–31)
Calcium: 9.6 mg/dL (ref 8.6–10.4)
Chloride: 101 mmol/L (ref 98–107)
Creatinine: 1.17 mg/dL (ref 0.70–1.20)
GFR African American: >60 mL/min (ref >60)
GFR Non-African American: >60 mL/min (ref >60)
Glucose: 103 mg/dL — ABNORMAL HIGH (ref 70–99)
Potassium: 4.6 mmol/L (ref 3.7–5.3)
Sodium: 138 mmol/L (ref 135–144)

## 2013-09-18 LAB — PATIENT FASTING?

## 2013-10-01 ENCOUNTER — Encounter (HOSPITAL_COMMUNITY): Payer: Self-pay | Admitting: Emergency Medicine

## 2013-10-01 ENCOUNTER — Emergency Department (HOSPITAL_COMMUNITY)
Admission: EM | Admit: 2013-10-01 | Discharge: 2013-10-01 | Disposition: A | Discharge status: Home / Self Care | Payer: Medicare Other | Attending: Emergency Medicine | Admitting: Emergency Medicine

## 2013-10-01 DIAGNOSIS — Z872 Personal history of diseases of the skin and subcutaneous tissue: Secondary | ICD-10-CM | POA: Diagnosis not present

## 2013-10-01 DIAGNOSIS — G8929 Other chronic pain: Secondary | ICD-10-CM | POA: Insufficient documentation

## 2013-10-01 DIAGNOSIS — Z792 Long term (current) use of antibiotics: Secondary | ICD-10-CM | POA: Diagnosis not present

## 2013-10-01 DIAGNOSIS — K219 Gastro-esophageal reflux disease without esophagitis: Secondary | ICD-10-CM | POA: Insufficient documentation

## 2013-10-01 DIAGNOSIS — Z87891 Personal history of nicotine dependence: Secondary | ICD-10-CM | POA: Diagnosis not present

## 2013-10-01 DIAGNOSIS — J45909 Unspecified asthma, uncomplicated: Secondary | ICD-10-CM | POA: Insufficient documentation

## 2013-10-01 DIAGNOSIS — Z79899 Other long term (current) drug therapy: Secondary | ICD-10-CM | POA: Insufficient documentation

## 2013-10-01 DIAGNOSIS — R1033 Periumbilical pain: Secondary | ICD-10-CM | POA: Diagnosis not present

## 2013-10-01 DIAGNOSIS — M129 Arthropathy, unspecified: Secondary | ICD-10-CM | POA: Diagnosis not present

## 2013-10-01 DIAGNOSIS — Z8701 Personal history of pneumonia (recurrent): Secondary | ICD-10-CM | POA: Diagnosis not present

## 2013-10-01 DIAGNOSIS — R109 Unspecified abdominal pain: Secondary | ICD-10-CM | POA: Diagnosis not present

## 2013-10-01 LAB — CBC WITH DIFFERENTIAL/PLATELET
BASOS PCT: 0 % (ref 0–1)
Basophils Absolute: 0 10*3/uL (ref 0.0–0.1)
EOS ABS: 0.1 10*3/uL (ref 0.0–0.7)
Eosinophils Relative: 2 % (ref 0–5)
HCT: 43.1 % (ref 39.0–52.0)
HEMOGLOBIN: 13.6 g/dL (ref 13.0–17.0)
Lymphocytes Relative: 31 % (ref 12–46)
Lymphs Abs: 1.4 10*3/uL (ref 0.7–4.0)
MCH: 26.3 pg (ref 26.0–34.0)
MCHC: 31.6 g/dL (ref 30.0–36.0)
MCV: 83.2 fL (ref 78.0–100.0)
Monocytes Absolute: 0.4 10*3/uL (ref 0.1–1.0)
Monocytes Relative: 9 % (ref 3–12)
Neutro Abs: 2.6 10*3/uL (ref 1.7–7.7)
Neutrophils Relative %: 58 % (ref 43–77)
Platelets: 261 10*3/uL (ref 150–400)
RBC: 5.18 MIL/uL (ref 4.22–5.81)
RDW: 14.5 % (ref 11.5–15.5)
WBC: 4.5 10*3/uL (ref 4.0–10.5)

## 2013-10-01 LAB — COMPREHENSIVE METABOLIC PANEL
ALBUMIN: 4.2 g/dL (ref 3.5–5.2)
ALT: 139 U/L — ABNORMAL HIGH (ref 0–53)
AST: 60 U/L — ABNORMAL HIGH (ref 0–37)
Alkaline Phosphatase: 105 U/L (ref 39–117)
BILIRUBIN TOTAL: 0.7 mg/dL (ref 0.3–1.2)
BUN: 20 mg/dL (ref 6–23)
CO2: 28 mEq/L (ref 19–32)
CREATININE: 1.1 mg/dL (ref 0.50–1.35)
Calcium: 9.1 mg/dL (ref 8.4–10.5)
Chloride: 99 mEq/L (ref 96–112)
GFR calc non Af Amer: 77 mL/min — ABNORMAL LOW (ref >90)
GFR, EST AFRICAN AMERICAN: 89 mL/min — AB (ref >90)
Glucose, Bld: 129 mg/dL — ABNORMAL HIGH (ref 70–99)
Potassium: 4.1 mEq/L (ref 3.7–5.3)
Sodium: 139 mEq/L (ref 137–147)
Total Protein: 8.5 g/dL — ABNORMAL HIGH (ref 6.0–8.3)

## 2013-10-01 LAB — LIPASE, BLOOD: LIPASE: 24 U/L (ref 11–59)

## 2013-10-01 MED ORDER — OMEPRAZOLE 20 MG PO CPDR: 20.0000 mg | DELAYED_RELEASE_CAPSULE | Freq: Every day | ORAL | Status: DC

## 2013-10-01 MED ORDER — SUCRALFATE 1 G PO TABS: 1.0000 g | ORAL_TABLET | Freq: Four times a day (QID) | ORAL | Status: DC

## 2013-10-01 MED ORDER — ONDANSETRON HCL 4 MG/2ML IJ SOLN
4.0000 mg | Freq: Once | INTRAMUSCULAR | Status: AC
  Administered 2013-10-01: 4 mg via INTRAVENOUS
  Filled 2013-10-01: qty 2

## 2013-10-01 MED ORDER — GI COCKTAIL ~~LOC~~
30.0000 mL | Freq: Once | ORAL | Status: AC
  Administered 2013-10-01: 30 mL via ORAL
  Filled 2013-10-01: qty 30

## 2013-10-01 NOTE — ED Notes (Signed)
PT c/o abd pain and diarrhea x 2 days.
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
## 2013-10-01 NOTE — Discharge Instructions (Signed)
As discussed, your evaluation today has been largely reassuring.  But, it is important that you monitor your condition carefully, and do not hesitate to return to the ED if you develop new, or concerning changes in your condition. ° °Otherwise, please follow-up with your physician for appropriate ongoing care. ° ° ° °Abdominal Pain, Adult °Many things can cause abdominal pain. Usually, abdominal pain is not caused by a disease and will improve without treatment. It can often be observed and treated at home. Your health care provider will do a physical exam and possibly order blood tests and X-rays to help determine the seriousness of your pain. However, in many cases, more time must pass before a clear cause of the pain can be found. Before that point, your health care provider may not know if you need more testing or further treatment. °HOME CARE INSTRUCTIONS  °Monitor your abdominal pain for any changes. The following actions may help to alleviate any discomfort you are experiencing: °· Only take over-the-counter or prescription medicines as directed by your health care provider. °· Do not take laxatives unless directed to do so by your health care provider. °· Try a clear liquid diet (broth, tea, or water) as directed by your health care provider. Slowly move to a bland diet as tolerated. °SEEK MEDICAL CARE IF: °· You have unexplained abdominal pain. °· You have abdominal pain associated with nausea or diarrhea. °· You have pain when you urinate or have a bowel movement. °· You experience abdominal pain that wakes you in the night. °· You have abdominal pain that is worsened or improved by eating food. °· You have abdominal pain that is worsened with eating fatty foods. °SEEK IMMEDIATE MEDICAL CARE IF:  °· Your pain does not go away within 2 hours. °· You have a fever. °· You keep throwing up (vomiting). °· Your pain is felt only in portions of the abdomen, such as the right side or the left lower portion of the  abdomen. °· You pass bloody or black tarry stools. °MAKE SURE YOU: °· Understand these instructions.   °· Will watch your condition.   °· Will get help right away if you are not doing well or get worse.   °Document Released: 03/28/2005 Document Revised: 04/08/2013 Document Reviewed: 02/25/2013 °[employer]® Patient Information ©2014 [employer]. ° °

## 2013-10-05 NOTE — Progress Notes (Signed)
Completed.

## 2013-11-30 DIAGNOSIS — M549 Dorsalgia, unspecified: Secondary | ICD-10-CM | POA: Diagnosis not present

## 2013-11-30 DIAGNOSIS — R209 Unspecified disturbances of skin sensation: Secondary | ICD-10-CM | POA: Diagnosis not present

## 2014-02-11 MED ORDER — AZITHROMYCIN 250 MG PO TABS
250 MG | PACK | ORAL | Status: AC
Start: 2014-02-11 — End: 2014-02-21

## 2014-02-11 MED ORDER — ALBUTEROL SULFATE HFA 108 (90 BASE) MCG/ACT IN AERS
108 (90 Base) MCG/ACT | Freq: Four times a day (QID) | RESPIRATORY_TRACT | Status: AC | PRN
Start: 2014-02-11 — End: 2015-02-11

## 2014-02-11 MED ORDER — PREDNISONE 50 MG PO TABS
50 MG | ORAL_TABLET | Freq: Every day | ORAL | Status: DC
Start: 2014-02-11 — End: 2014-10-25

## 2014-02-11 NOTE — Progress Notes (Signed)
Have you seen any other physician or provider since your last visit no    Have you had any other diagnostic tests since your last visit? no    Have you changed or stopped any medications since your last visit including any over-the-counter medicines, vitamins, or herbal medicines? no     Are you taking all your prescribed medications? No  If NO, why? -  N/A           Patient Self-Management Goal for this visit.   What is your goal for your visit today? - sick visit   Barriers to success: none   Plan for overcoming my barriers: N/A      Confidence: 10/10   Date goal set: 02/11/14   Date expected to reach goal:       Health Maintenance Due   Topic Date Due   ??? TETANUS VACCINE ADULT (11 YEARS AND UP)  02/22/1975   ??? FLU VACCINE YEARLY (ADULT)  03/02/2014

## 2014-02-11 NOTE — Progress Notes (Signed)
Subjective:      Patient ID: Mark Buchanan is a 48 y.o. male.    Cough  This is a new problem. The problem has been gradually worsening. The problem occurs constantly. The cough is productive of sputum (productive of whitish sputum.). Associated symptoms include nasal congestion, rhinorrhea, a sore throat, shortness of breath and wheezing. Pertinent negatives include no chest pain, chills, ear pain, eye redness, fever, headaches, heartburn, hemoptysis, myalgias, postnasal drip, rash, sweats or weight loss. Nothing aggravates the symptoms. Risk factors for lung disease include smoking/tobacco exposure. He has tried a beta-agonist inhaler for the symptoms. The treatment provided moderate relief. His past medical history is significant for bronchitis. There is no history of asthma, COPD, emphysema, environmental allergies or pneumonia.       Review of Systems   Constitutional: Negative.  Negative for fever, chills, weight loss, diaphoresis, activity change, appetite change, fatigue and unexpected weight change.   HENT: Positive for rhinorrhea and sore throat. Negative for congestion, dental problem, drooling, ear discharge, ear pain, facial swelling, hearing loss, mouth sores, nosebleeds, postnasal drip, sinus pressure, sneezing, tinnitus, trouble swallowing and voice change.    Eyes: Negative.  Negative for photophobia, pain, discharge, redness, itching and visual disturbance.   Respiratory: Positive for cough, shortness of breath and wheezing. Negative for apnea, hemoptysis, choking, chest tightness and stridor.    Cardiovascular: Negative.  Negative for chest pain, palpitations and leg swelling.   Gastrointestinal: Negative.  Negative for heartburn, nausea, vomiting, abdominal pain, diarrhea, constipation, blood in stool, abdominal distention, anal bleeding and rectal pain.   Endocrine: Negative.  Negative for cold intolerance, heat intolerance, polydipsia, polyphagia and polyuria.   Genitourinary: Negative.   Negative for dysuria, urgency, frequency, hematuria, flank pain, decreased urine volume, discharge, penile swelling, scrotal swelling, enuresis, difficulty urinating, genital sores, penile pain and testicular pain.   Musculoskeletal: Negative.  Negative for myalgias, back pain, joint swelling, arthralgias, gait problem, neck pain and neck stiffness.   Skin: Negative.  Negative for color change, pallor, rash and wound.   Allergic/Immunologic: Negative.  Negative for environmental allergies, food allergies and immunocompromised state.   Neurological: Negative.  Negative for dizziness, tremors, seizures, syncope, facial asymmetry, speech difficulty, weakness, light-headedness, numbness and headaches.   Hematological: Negative.  Negative for adenopathy. Does not bruise/bleed easily.   Psychiatric/Behavioral: Negative.  Negative for suicidal ideas, hallucinations, behavioral problems, confusion, sleep disturbance, self-injury, dysphoric mood, decreased concentration and agitation. The patient is not nervous/anxious and is not hyperactive.    All other systems reviewed and are negative.      Objective:   Physical Exam   Constitutional: He is oriented to person, place, and time. He appears well-developed and well-nourished.   HENT:   Head: Normocephalic and atraumatic.   Right Ear: External ear normal.   Left Ear: External ear normal.   Nose: Nose normal.   Mouth/Throat: Oropharynx is clear and moist. No oropharyngeal exudate.   Rapid strep test is negative.   Neck: Normal range of motion. Neck supple. No thyromegaly present.   Cardiovascular: Normal rate, regular rhythm, normal heart sounds and intact distal pulses.    Pulmonary/Chest: Effort normal. He has wheezes.   Mild expiratory wheezing.   Abdominal: Soft. Bowel sounds are normal.   Musculoskeletal: Normal range of motion.   Lymphadenopathy:     He has no cervical adenopathy.   Neurological: He is alert and oriented to person, place, and time.   Skin: Skin is warm.    Psychiatric: He  has a normal mood and affect.   Nursing note and vitals reviewed.      Assessment:      1. Cough  albuterol (PROVENTIL HFA) 108 (90 BASE) MCG/ACT inhaler    predniSONE (DELTASONE) 50 MG tablet   2. Acute bronchitis, unspecified organism  azithromycin (ZITHROMAX) 250 MG tablet    albuterol (PROVENTIL HFA) 108 (90 BASE) MCG/ACT inhaler    predniSONE (DELTASONE) 50 MG tablet   3. Nicotine addiction              Plan:      Requested Prescriptions     Signed Prescriptions Disp Refills   ??? azithromycin (ZITHROMAX) 250 MG tablet 1 packet 0     Sig: Take 2 tabs (500 mg) on Day 1, and take 1 tab (250 mg) on days 2 through 5.   ??? albuterol (PROVENTIL HFA) 108 (90 BASE) MCG/ACT inhaler 1 Inhaler 3     Sig: Inhale 2 puffs into the lungs every 6 hours as needed for Wheezing   ??? predniSONE (DELTASONE) 50 MG tablet 6 tablet 0     Sig: Take 1 tablet by mouth daily   prescribed Zpak as directed;Prednisone 50 mg /day x6 days;Proventil HFA MDI 2 puffs up to 4x /day as needed for SOB/coughing;storngly advised to quit smoking and discussed with him why;reassurance,follow up as needed.

## 2014-03-10 ENCOUNTER — Encounter (HOSPITAL_COMMUNITY): Payer: Self-pay | Admitting: Emergency Medicine

## 2014-03-10 ENCOUNTER — Emergency Department (HOSPITAL_COMMUNITY)
Admission: EM | Admit: 2014-03-10 | Discharge: 2014-03-10 | Disposition: A | Discharge status: Home / Self Care | Payer: Medicare Other | Attending: Emergency Medicine | Admitting: Emergency Medicine

## 2014-03-10 ENCOUNTER — Emergency Department (HOSPITAL_COMMUNITY): Payer: Medicare Other

## 2014-03-10 DIAGNOSIS — J45909 Unspecified asthma, uncomplicated: Secondary | ICD-10-CM | POA: Diagnosis not present

## 2014-03-10 DIAGNOSIS — Z87891 Personal history of nicotine dependence: Secondary | ICD-10-CM | POA: Diagnosis not present

## 2014-03-10 DIAGNOSIS — R0789 Other chest pain: Secondary | ICD-10-CM

## 2014-03-10 DIAGNOSIS — K219 Gastro-esophageal reflux disease without esophagitis: Secondary | ICD-10-CM | POA: Insufficient documentation

## 2014-03-10 DIAGNOSIS — R071 Chest pain on breathing: Secondary | ICD-10-CM | POA: Diagnosis not present

## 2014-03-10 DIAGNOSIS — Z79899 Other long term (current) drug therapy: Secondary | ICD-10-CM | POA: Diagnosis not present

## 2014-03-10 DIAGNOSIS — Z8701 Personal history of pneumonia (recurrent): Secondary | ICD-10-CM | POA: Diagnosis not present

## 2014-03-10 DIAGNOSIS — Z792 Long term (current) use of antibiotics: Secondary | ICD-10-CM | POA: Insufficient documentation

## 2014-03-10 DIAGNOSIS — G8929 Other chronic pain: Secondary | ICD-10-CM | POA: Insufficient documentation

## 2014-03-10 DIAGNOSIS — R079 Chest pain, unspecified: Secondary | ICD-10-CM | POA: Insufficient documentation

## 2014-03-10 DIAGNOSIS — M549 Dorsalgia, unspecified: Secondary | ICD-10-CM | POA: Diagnosis not present

## 2014-03-10 DIAGNOSIS — M129 Arthropathy, unspecified: Secondary | ICD-10-CM | POA: Diagnosis not present

## 2014-03-10 DIAGNOSIS — Z872 Personal history of diseases of the skin and subcutaneous tissue: Secondary | ICD-10-CM | POA: Insufficient documentation

## 2014-03-10 MED ORDER — CHLORZOXAZONE 500 MG PO TABS: 500.0000 mg | ORAL_TABLET | Freq: Three times a day (TID) | ORAL | Status: DC | PRN

## 2014-03-10 MED ORDER — ACETAMINOPHEN-CODEINE #3 300-30 MG PO TABS: 1.0000 | ORAL_TABLET | Freq: Four times a day (QID) | ORAL | Status: DC | PRN

## 2014-03-10 NOTE — ED Notes (Addendum)
Pt reports left rib pain,left shoulder pain and left leg pain x 62month. Pt denies any new or recent injury. nad noted. Pt reports increased pain with movement.
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
## 2014-03-10 NOTE — Discharge Instructions (Signed)
Your EKG and chest x-ray are negative for acute problem. Please use the Parafon Forte and Tylenol codeine for her discomfort. Please rest your chest muscles is much as possible. Please see Dr. Eber Jones for additional evaluation and followup. Chest Wall Pain Chest wall pain is pain in or around the bones and muscles of your chest. It may take up to 6 weeks to get better. It may take longer if you must stay physically active in your work and activities.  CAUSES  Chest wall pain may happen on its own. However, it may be caused by:  A viral illness like the flu.  Injury.  Coughing.  Exercise.  Arthritis.  Fibromyalgia.  Shingles. HOME CARE INSTRUCTIONS   Avoid overtiring physical activity. Try not to strain or perform activities that cause pain. This includes any activities using your chest or your abdominal and side muscles, especially if heavy weights are used.  Put ice on the sore area.  Put ice in a plastic bag.  Place a towel between your skin and the bag.  Leave the ice on for 15-20 minutes per hour while awake for the first 2 days.  Only take over-the-counter or prescription medicines for pain, discomfort, or fever as directed by your caregiver. SEEK IMMEDIATE MEDICAL CARE IF:   Your pain increases, or you are very uncomfortable.  You have a fever.  Your chest pain becomes worse.  You have new, unexplained symptoms.  You have nausea or vomiting.  You feel sweaty or lightheaded.  You have a cough with phlegm (sputum), or you cough up blood. MAKE SURE YOU:   Understand these instructions.  Will watch your condition.  Will get help right away if you are not doing well or get worse. Document Released: 06/18/2005 Document Revised: 09/10/2011 Document Reviewed: 02/12/2011 [facility] Patient Information 2015 [place], [place]. This information is not intended to replace advice given to you by your health care provider. Make sure you discuss any questions you have with your  health care provider.

## 2014-03-11 DIAGNOSIS — R0789 Other chest pain: Secondary | ICD-10-CM | POA: Diagnosis not present

## 2014-03-11 DIAGNOSIS — M25569 Pain in unspecified knee: Secondary | ICD-10-CM | POA: Diagnosis not present

## 2014-03-13 NOTE — ED Provider Notes (Signed)
Medical screening examination/treatment/procedure(s) were performed by non-physician practitioner and as supervising physician I was immediately available for consultation/collaboration.   EKG Interpretation   Date/Time:  Wednesday March 10 2014 20:43:22 EDT Ventricular Rate:  89 PR Interval:  154 QRS Duration: 95 QT Interval:  354 QTC Calculation: 431 R Axis:   82 Text Interpretation:  Sinus rhythm Probable left ventricular hypertrophy  ED PHYSICIAN INTERPRETATION AVAILABLE IN [facility] Confirmed by  TEST, Record ([id number]) on 03/12/2014 6:55:11 AM        Sharyon Cable, MD 03/13/14 [phone]

## 2014-03-14 ENCOUNTER — Encounter (HOSPITAL_COMMUNITY): Payer: Self-pay | Admitting: Emergency Medicine

## 2014-03-14 ENCOUNTER — Emergency Department (HOSPITAL_COMMUNITY): Payer: Medicare Other

## 2014-03-14 ENCOUNTER — Emergency Department (HOSPITAL_COMMUNITY)
Admission: EM | Admit: 2014-03-14 | Discharge: 2014-03-14 | Disposition: A | Discharge status: Home / Self Care | Payer: Medicare Other | Attending: Emergency Medicine | Admitting: Emergency Medicine

## 2014-03-14 DIAGNOSIS — IMO0002 Reserved for concepts with insufficient information to code with codable children: Secondary | ICD-10-CM | POA: Diagnosis not present

## 2014-03-14 DIAGNOSIS — G8929 Other chronic pain: Secondary | ICD-10-CM | POA: Diagnosis not present

## 2014-03-14 DIAGNOSIS — Z8701 Personal history of pneumonia (recurrent): Secondary | ICD-10-CM | POA: Insufficient documentation

## 2014-03-14 DIAGNOSIS — M1710 Unilateral primary osteoarthritis, unspecified knee: Secondary | ICD-10-CM | POA: Diagnosis not present

## 2014-03-14 DIAGNOSIS — Z79899 Other long term (current) drug therapy: Secondary | ICD-10-CM | POA: Diagnosis not present

## 2014-03-14 DIAGNOSIS — Z872 Personal history of diseases of the skin and subcutaneous tissue: Secondary | ICD-10-CM | POA: Insufficient documentation

## 2014-03-14 DIAGNOSIS — J45909 Unspecified asthma, uncomplicated: Secondary | ICD-10-CM | POA: Diagnosis not present

## 2014-03-14 DIAGNOSIS — Z8719 Personal history of other diseases of the digestive system: Secondary | ICD-10-CM | POA: Insufficient documentation

## 2014-03-14 DIAGNOSIS — M25569 Pain in unspecified knee: Secondary | ICD-10-CM | POA: Insufficient documentation

## 2014-03-14 DIAGNOSIS — M1712 Unilateral primary osteoarthritis, left knee: Secondary | ICD-10-CM

## 2014-03-14 DIAGNOSIS — Z87891 Personal history of nicotine dependence: Secondary | ICD-10-CM | POA: Diagnosis not present

## 2014-03-14 LAB — D-DIMER, QUANTITATIVE (NOT AT ARMC): D-Dimer, Quant: 0.35 ug/mL-FEU (ref 0.00–0.48)

## 2014-03-14 MED ORDER — PREDNISONE 20 MG PO TABS
40.0000 mg | ORAL_TABLET | Freq: Once | ORAL | Status: AC
  Administered 2014-03-14: 40 mg via ORAL
  Filled 2014-03-14: qty 2

## 2014-03-14 MED ORDER — METHYLPREDNISOLONE ACETATE 80 MG/ML IJ SUSP
40.0000 mg | Freq: Once | INTRAMUSCULAR | Status: AC
  Administered 2014-03-14: 40 mg via INTRA_ARTICULAR
  Filled 2014-03-14: qty 1

## 2014-03-14 MED ORDER — PREDNISONE 20 MG PO TABS: 40.0000 mg | ORAL_TABLET | Freq: Every day | ORAL | Status: DC

## 2014-03-14 MED ORDER — BUPIVACAINE HCL (PF) 0.5 % IJ SOLN
10.0000 mL | Freq: Once | INTRAMUSCULAR | Status: AC
  Administered 2014-03-14: 10 mL
  Filled 2014-03-14: qty 30

## 2014-03-14 MED ORDER — OXYCODONE-ACETAMINOPHEN 5-325 MG PO TABS: 1.0000 | ORAL_TABLET | ORAL | Status: DC | PRN

## 2014-03-14 MED ORDER — KETOROLAC TROMETHAMINE 30 MG/ML IJ SOLN
15.0000 mg | Freq: Once | INTRAMUSCULAR | Status: AC
  Administered 2014-03-14: 15 mg via INTRAMUSCULAR
  Filled 2014-03-14: qty 1

## 2014-03-14 NOTE — ED Notes (Signed)
Pt presents to ED c/o of pain behind LT knee. Pt states pain began two weeks ago, but has increasingly gotten worse. Pt states he has been taking Advil with no relief. Pt denies knowledge of injury.

## 2014-03-14 NOTE — Discharge Instructions (Signed)
Arthritis, Nonspecific °Arthritis is inflammation of a joint. This usually means pain, redness, warmth or swelling are present. One or more joints may be involved. There are a number of types of arthritis. Your caregiver may not be able to tell what type of arthritis you have right away. °CAUSES  °The most common cause of arthritis is the wear and tear on the joint (osteoarthritis). This causes damage to the cartilage, which can break down over time. The knees, hips, back and neck are most often affected by this type of arthritis. °Other types of arthritis and common causes of joint pain include: °· Sprains and other injuries near the joint. Sometimes minor sprains and injuries cause pain and swelling that develop hours later. °· Rheumatoid arthritis. This affects hands, feet and knees. It usually affects both sides of your body at the same time. It is often associated with chronic ailments, fever, weight loss and general weakness. °· Crystal arthritis. Gout and pseudo gout can cause occasional acute severe pain, redness and swelling in the foot, ankle, or knee. °· Infectious arthritis. Bacteria can get into a joint through a break in overlying skin. This can cause infection of the joint. Bacteria and viruses can also spread through the blood and affect your joints. °· Drug, infectious and allergy reactions. Sometimes joints can become mildly painful and slightly swollen with these types of illnesses. °SYMPTOMS  °· Pain is the main symptom. °· Your joint or joints can also be red, swollen and warm or hot to the touch. °· You may have a fever with certain types of arthritis, or even feel overall ill. °· The joint with arthritis will hurt with movement. Stiffness is present with some types of arthritis. °DIAGNOSIS  °Your caregiver will suspect arthritis based on your description of your symptoms and on your exam. Testing may be needed to find the type of arthritis: °· Blood and sometimes urine tests. °· X-ray tests  and sometimes CT or MRI scans. °· Removal of fluid from the joint (arthrocentesis) is done to check for bacteria, crystals or other causes. Your caregiver (or a specialist) will numb the area over the joint with a local anesthetic, and use a needle to remove joint fluid for examination. This procedure is only minimally uncomfortable. °· Even with these tests, your caregiver may not be able to tell what kind of arthritis you have. Consultation with a specialist (rheumatologist) may be helpful. °TREATMENT  °Your caregiver will discuss with you treatment specific to your type of arthritis. If the specific type cannot be determined, then the following general recommendations may apply. °Treatment of severe joint pain includes: °· Rest. °· Elevation. °· Anti-inflammatory medication (for example, ibuprofen) may be prescribed. Avoiding activities that cause increased pain. °· Only take over-the-counter or prescription medicines for pain and discomfort as recommended by your caregiver. °· Cold packs over an inflamed joint may be used for 10 to 15 minutes every hour. Hot packs sometimes feel better, but do not use overnight. Do not use hot packs if you are diabetic without your caregiver's permission. °· A cortisone shot into arthritic joints may help reduce pain and swelling. °· Any acute arthritis that gets worse over the next 1 to 2 days needs to be looked at to be sure there is no joint infection. °Long-term arthritis treatment involves modifying activities and lifestyle to reduce joint stress jarring. This can include weight loss. Also, exercise is needed to nourish the joint cartilage and remove waste. This helps keep the muscles   around the joint strong. °HOME CARE INSTRUCTIONS  °· Do not take aspirin to relieve pain if gout is suspected. This elevates uric acid levels. °· Only take over-the-counter or prescription medicines for pain, discomfort or fever as directed by your caregiver. °· Rest the joint as much as  possible. °· If your joint is swollen, keep it elevated. °· Use crutches if the painful joint is in your leg. °· Drinking plenty of fluids may help for certain types of arthritis. °· Follow your caregiver's dietary instructions. °· Try low-impact exercise such as: °¨ Swimming. °¨ Water aerobics. °¨ Biking. °¨ Walking. °· Morning stiffness is often relieved by a warm shower. °· Put your joints through regular range-of-motion. °SEEK MEDICAL CARE IF:  °· You do not feel better in 24 hours or are getting worse. °· You have side effects to medications, or are not getting better with treatment. °SEEK IMMEDIATE MEDICAL CARE IF:  °· You have a fever. °· You develop severe joint pain, swelling or redness. °· Many joints are involved and become painful and swollen. °· There is severe back pain and/or leg weakness. °· You have loss of bowel or bladder control. °Document Released: 07/26/2004 Document Revised: 09/10/2011 Document Reviewed: 08/11/2008 °[employer]® Patient Information ©2015 [employer]. This information is not intended to replace advice given to you by your health care provider. Make sure you discuss any questions you have with your health care provider. ° °
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
## 2014-04-22 ENCOUNTER — Encounter: Payer: Self-pay | Admitting: Orthopedic Surgery

## 2014-04-22 ENCOUNTER — Ambulatory Visit (INDEPENDENT_AMBULATORY_CARE_PROVIDER_SITE_OTHER): Payer: Medicare Other | Admitting: Orthopedic Surgery

## 2014-04-22 VITALS — BP 145/90 | Ht 68.0 in | Wt 280.0 lb

## 2014-04-22 DIAGNOSIS — M1712 Unilateral primary osteoarthritis, left knee: Secondary | ICD-10-CM | POA: Diagnosis not present

## 2014-04-22 MED ORDER — OXYCODONE-ACETAMINOPHEN 5-325 MG PO TABS: 1.0000 | ORAL_TABLET | ORAL | Status: DC | PRN
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
## 2014-05-13 ENCOUNTER — Encounter: Payer: Self-pay | Admitting: Orthopedic Surgery

## 2014-05-13 ENCOUNTER — Ambulatory Visit (INDEPENDENT_AMBULATORY_CARE_PROVIDER_SITE_OTHER): Payer: Medicare Other | Admitting: Orthopedic Surgery

## 2014-05-13 VITALS — BP 144/91 | Ht 68.0 in | Wt 280.0 lb

## 2014-05-13 DIAGNOSIS — M1712 Unilateral primary osteoarthritis, left knee: Secondary | ICD-10-CM

## 2014-05-13 MED ORDER — HYDROCODONE-ACETAMINOPHEN 7.5-325 MG PO TABS: 1.0000 | ORAL_TABLET | Freq: Four times a day (QID) | ORAL | Status: DC | PRN

## 2014-05-13 NOTE — Progress Notes (Signed)
Patient ID: Terry Case, male   DOB: Nov 04, 1965, 48 y.o.   MRN: [id number]  Chief Complaint  Patient presents with  . Follow-up    follow up left knee s/p injection    Status post injection left knee x-ray showed osteoarthritis severe. He said he did get good relief from the injection.  He is also on some Norco for pain  He previously took some Percocet for pain as well with good relief.  Review of systems left shoulder pain not addressed on this visit  BP 144/91 mmHg  Ht 5\' 8"  (1.727 m)  Wt 280 lb (127.007 kg)  BMI 42.58 kg/m2 Appearance normal oriented 3 mood normal gait normal left knee no swelling range of motion unchanged knee stable motor exam normal skin intact good distal pulse  Osteoarthritis left knee improved with injection  Continue hydrocodone 7.5 follow-up 2-3 months for recheck

## 2014-06-02 DIAGNOSIS — M25569 Pain in unspecified knee: Secondary | ICD-10-CM | POA: Diagnosis not present

## 2014-06-02 DIAGNOSIS — E669 Obesity, unspecified: Secondary | ICD-10-CM | POA: Diagnosis not present

## 2014-06-02 DIAGNOSIS — M545 Low back pain: Secondary | ICD-10-CM | POA: Diagnosis not present

## 2014-06-02 DIAGNOSIS — K219 Gastro-esophageal reflux disease without esophagitis: Secondary | ICD-10-CM | POA: Diagnosis not present

## 2014-06-17 ENCOUNTER — Encounter: Payer: Self-pay | Admitting: Orthopedic Surgery

## 2014-06-17 ENCOUNTER — Ambulatory Visit (INDEPENDENT_AMBULATORY_CARE_PROVIDER_SITE_OTHER): Payer: Medicare Other | Admitting: Orthopedic Surgery

## 2014-06-17 DIAGNOSIS — M1712 Unilateral primary osteoarthritis, left knee: Secondary | ICD-10-CM

## 2014-06-17 MED ORDER — HYDROCODONE-ACETAMINOPHEN 7.5-325 MG PO TABS: 1.0000 | ORAL_TABLET | Freq: Four times a day (QID) | ORAL | Status: DC | PRN

## 2014-06-17 NOTE — Patient Instructions (Signed)
Continue medication.

## 2014-06-17 NOTE — Progress Notes (Signed)
Patient ID: NADIR VASQUES, male   DOB: 1966-02-21, 48 y.o.   MRN: [id number] Recheck left knee osteoarthritis  48 year old male osteoarthritis fairly severe treated with Norco 7.5 mg good pain relief  No complaints no new findings on review of systems  Today he is awake and alert is oriented 3 walks without support there is no limp has no tenderness or swelling his range of motion is approximately 125 knee is stable muscle tone is normal McMurray sign negative  Impression stable osteoarthritis follow-up 2 months continue Norco 7.5 mg  Meds ordered this encounter  Medications  . famotidine (PEPCID) 20 MG tablet    Sig: Take 20 mg by mouth 2 (two) times daily.  Marland Kitchen HYDROcodone-acetaminophen (NORCO) 7.5-325 MG per tablet    Sig: Take 1 tablet by mouth every 6 (six) hours as needed (pain).    Dispense:  120 tablet    Refill:  0

## 2014-08-19 ENCOUNTER — Encounter: Payer: Self-pay | Admitting: Orthopedic Surgery

## 2014-08-19 ENCOUNTER — Ambulatory Visit (INDEPENDENT_AMBULATORY_CARE_PROVIDER_SITE_OTHER): Payer: Medicare Other | Admitting: Orthopedic Surgery

## 2014-08-19 VITALS — BP 138/99 | Ht 68.0 in | Wt 280.0 lb

## 2014-08-19 DIAGNOSIS — M1712 Unilateral primary osteoarthritis, left knee: Secondary | ICD-10-CM

## 2014-08-19 MED ORDER — HYDROCODONE-ACETAMINOPHEN 7.5-325 MG PO TABS: 1.0000 | ORAL_TABLET | Freq: Four times a day (QID) | ORAL | Status: DC | PRN

## 2014-08-19 NOTE — Progress Notes (Signed)
Chief Complaint  Patient presents with  . Follow-up    left knee pain and swelling     the patient has been followed for osteoarthritis of his left knee was controlled well with 7.5 mg hydrocodone and is received one to 2 injections.  He has a history of lumbar disc disease but no current symptoms  Complains of increasing left knee pain swelling during way and stiffness after sitting Exam shows well-developed well-nourished male grooming hygiene normal oriented 3 mood normal gait antalgic favoring the left leg. Small joint effusion is noted medial joint line tenderness varus alignment to the knee knee flexion 115 knee extension 5 flexion contracture ligament stable in the knee , motor exam normal. Skin warm dry and intact pulses good distally  The patient has a history of sleep apnea he will see Dr. Berdine Addison to address that because he may need knee replacement surgery  Aspiration injection left knee aspiration revealed 5 mL of fluid  Procedure note left knee injection verbal consent was obtained to inject left knee joint  Timeout was completed to confirm the site of injection  The medications used were 40 mg of Depo-Medrol and 1% lidocaine 3 cc  Anesthesia was provided by ethyl chloride and the skin was prepped with alcohol.  After cleaning the skin with alcohol a 20-gauge needle was used to inject the left knee joint. There were no complications. A sterile bandage was applied.

## 2014-09-01 DIAGNOSIS — M1712 Unilateral primary osteoarthritis, left knee: Secondary | ICD-10-CM | POA: Diagnosis not present

## 2014-09-01 DIAGNOSIS — G4733 Obstructive sleep apnea (adult) (pediatric): Secondary | ICD-10-CM | POA: Diagnosis not present

## 2014-09-09 ENCOUNTER — Ambulatory Visit (INDEPENDENT_AMBULATORY_CARE_PROVIDER_SITE_OTHER): Payer: Medicare Other | Admitting: Orthopedic Surgery

## 2014-09-09 VITALS — BP 146/101 | Ht 68.0 in | Wt 280.0 lb

## 2014-09-09 DIAGNOSIS — M1712 Unilateral primary osteoarthritis, left knee: Secondary | ICD-10-CM | POA: Diagnosis not present

## 2014-09-09 MED ORDER — HYDROCODONE-ACETAMINOPHEN 7.5-325 MG PO TABS: 1.0000 | ORAL_TABLET | Freq: Four times a day (QID) | ORAL | Status: DC | PRN

## 2014-09-09 NOTE — Progress Notes (Signed)
Progress note follow-up visit Chief Complaint  Patient presents with  . Follow-up    3 week recheck left knee s/p medication    Status post aspiration injection left knee patient on Norco 7.5 and ibuprofen now on by mobile which is a naproxen omeprazole and combination medication he is also on phentermine to help him lose weight currently at 280 pounds  He saw Dr. Berdine Addison  Sleep study ordered  Patient's knee no change  Review of systems negative  Refill medication return April 18 or 19th for discussion of knee replacement surgery depending on sleep study

## 2014-10-25 ENCOUNTER — Ambulatory Visit
Admit: 2014-10-25 | Discharge: 2014-10-25 | Payer: PRIVATE HEALTH INSURANCE | Attending: Family Medicine | Primary: Internal Medicine

## 2014-10-25 DIAGNOSIS — I10 Essential (primary) hypertension: Secondary | ICD-10-CM

## 2014-10-25 MED ORDER — LISINOPRIL 20 MG PO TABS
20 MG | ORAL_TABLET | Freq: Every day | ORAL | Status: DC
Start: 2014-10-25 — End: 2016-03-22

## 2014-10-25 MED ORDER — NICOTINE 21 MG/24HR TD PT24
21 MG/24HR | MEDICATED_PATCH | TRANSDERMAL | Status: DC
Start: 2014-10-25 — End: 2016-01-22

## 2014-10-25 MED ORDER — DICLOFENAC SODIUM 50 MG PO TBEC
50 MG | ORAL_TABLET | Freq: Three times a day (TID) | ORAL | Status: DC
Start: 2014-10-25 — End: 2016-02-14

## 2014-10-25 MED ORDER — CILOSTAZOL 100 MG PO TABS
100 MG | ORAL_TABLET | Freq: Two times a day (BID) | ORAL | Status: DC
Start: 2014-10-25 — End: 2016-02-14

## 2014-10-25 NOTE — Progress Notes (Signed)
Subjective:      Patient ID: Mark Buchanan is a 49 y.o. male.    Hip Pain   The incident occurred more than 1 week ago. The incident occurred at home. There was no injury mechanism. The pain is present in the right hip. The quality of the pain is described as aching. The pain is at a severity of 5/10. The pain is moderate. The pain has been intermittent since onset. Pertinent negatives include no inability to bear weight, loss of motion, loss of sensation, muscle weakness, numbness or tingling. He reports no foreign bodies present. The symptoms are aggravated by weight bearing. He has tried nothing for the symptoms. The treatment provided no relief.   Leg Pain   The incident occurred more than 1 week ago. The incident occurred in the street. There was no injury mechanism (history of smoking for the past 30 yrs.). The pain is present in the left thigh, right thigh and right leg. The quality of the pain is described as cramping. The pain is at a severity of 6/10. The pain is severe. The pain has been intermittent since onset. Pertinent negatives include no inability to bear weight, loss of motion, loss of sensation, muscle weakness, numbness or tingling. Associated symptoms comments: Walking for less than 1 block make his right thigh feels tight/cramping and his right calf hurting;have to stop and rest/sit to make pain go away.Marland Kitchen. He reports no foreign bodies present. Exacerbated by: walking for less than 1 block or vacuuming floors,etc. He has tried nothing for the symptoms. The treatment provided no relief.   Hypertension  This is a new problem. The current episode started more than 1 month ago. The problem has been gradually worsening since onset. The problem is uncontrolled. Pertinent negatives include no chest pain, headaches, neck pain, palpitations or shortness of breath. There are no associated agents to hypertension. Risk factors for coronary artery disease include smoking/tobacco exposure. The current  treatment provides no improvement. There is no history of angina, kidney disease, CVA, heart failure, left ventricular hypertrophy, PVD, renovascular disease, retinopathy or a thyroid problem. There is no history of chronic renal disease, hyperparathyroidism, a hypertension causing med, pheochromocytoma or sleep apnea.       Review of Systems   Constitutional: Negative.  Negative for fever, chills, diaphoresis, activity change, appetite change, fatigue and unexpected weight change.   HENT: Negative.  Negative for congestion, dental problem, drooling, ear discharge, ear pain, facial swelling, hearing loss, mouth sores, nosebleeds, postnasal drip, rhinorrhea, sinus pressure, sneezing, sore throat, tinnitus, trouble swallowing and voice change.    Eyes: Negative.  Negative for photophobia, pain, discharge, redness, itching and visual disturbance.   Respiratory: Negative.  Negative for apnea, cough, choking, chest tightness, shortness of breath, wheezing and stridor.    Cardiovascular: Negative.  Negative for chest pain, palpitations and leg swelling.   Gastrointestinal: Negative.  Negative for nausea, vomiting, abdominal pain, diarrhea, constipation, blood in stool, abdominal distention, anal bleeding and rectal pain.   Endocrine: Negative.  Negative for cold intolerance, heat intolerance, polydipsia, polyphagia and polyuria.   Genitourinary: Negative.  Negative for dysuria, urgency, frequency, hematuria, flank pain, decreased urine volume, discharge, penile swelling, scrotal swelling, enuresis, difficulty urinating, genital sores, penile pain and testicular pain.   Musculoskeletal: Positive for myalgias and arthralgias. Negative for back pain, joint swelling, gait problem, neck pain and neck stiffness.   Skin: Negative.  Negative for color change, pallor, rash and wound.   Allergic/Immunologic: Negative.  Negative for  environmental allergies, food allergies and immunocompromised state.   Neurological: Negative.   Negative for dizziness, tingling, tremors, seizures, syncope, facial asymmetry, speech difficulty, weakness, light-headedness, numbness and headaches.   Hematological: Negative.  Negative for adenopathy. Does not bruise/bleed easily.   Psychiatric/Behavioral: Negative.  Negative for suicidal ideas, hallucinations, behavioral problems, confusion, sleep disturbance, self-injury, dysphoric mood, decreased concentration and agitation. The patient is not nervous/anxious and is not hyperactive.    All other systems reviewed and are negative.      Objective:   Physical Exam   Constitutional: He is oriented to person, place, and time. He appears well-developed and well-nourished.   HENT:   Head: Normocephalic and atraumatic.   Neck: Normal range of motion. Neck supple.   Cardiovascular: Normal rate, regular rhythm, normal heart sounds and intact distal pulses.  Exam reveals no gallop and no friction rub.    No murmur heard.  Pulmonary/Chest: Effort normal and breath sounds normal.   Abdominal: Soft. Bowel sounds are normal.   Musculoskeletal: Normal range of motion. He exhibits no edema.        Right hip: He exhibits tenderness. He exhibits normal range of motion, normal strength, no swelling, no crepitus, no deformity and no laceration.   Cold feet with weak dorsal pedal and achilles pulses   Neurological: He is alert and oriented to person, place, and time.   Psychiatric: He has a normal mood and affect. His behavior is normal. Thought content normal.   Nursing note and vitals reviewed.      Assessment:      1. Essential hypertension  lisinopril (PRINIVIL;ZESTRIL) 20 MG tablet   2. Arthralgia of right hip  diclofenac (VOLTAREN) 50 MG EC tablet   3. Claudication in peripheral vascular disease (HCC)  Ultrasound doppler arterial legs bilateral    cilostazol (PLETAL) 100 MG tablet    Lipid Panel    Basic Metabolic Panel   4. Cigarette nicotine dependence with nicotine-induced disorder (HCC)  nicotine (NICODERM CQ) 21 MG/24HR    5. Need for pneumococcal vaccination  Pneumococcal conjugate vaccine 13-valent   6. Need for Tdap vaccination  Tdap vaccine greater than or equal to 7yo IM            Plan:      Requested Prescriptions     Signed Prescriptions Disp Refills   ??? cilostazol (PLETAL) 100 MG tablet 60 tablet 3     Sig: Take 1 tablet by mouth 2 times daily   ??? diclofenac (VOLTAREN) 50 MG EC tablet 60 tablet 3     Sig: Take 1 tablet by mouth 3 times daily (with meals)   ??? lisinopril (PRINIVIL;ZESTRIL) 20 MG tablet 90 tablet 2     Sig: Take 1 tablet by mouth daily   ??? nicotine (NICODERM CQ) 21 MG/24HR 30 patch 3     Sig: Place 1 patch onto the skin every 24 hours

## 2014-10-25 NOTE — Progress Notes (Signed)
Have you seen any other physician or provider since your last visit yes - Dr. Domingo CockingStamp - Chiropractor - adjustment    Have you had any other diagnostic tests since your last visit? no    Have you changed or stopped any medications since your last visit including any over-the-counter medicines, vitamins, or herbal medicines? no     Are you taking all your prescribed medications? Yes  If NO, why? -  N/A           Patient Self-Management Goal for this visit.   What is your goal for your visit today? - foot circulation / hip pain x 3 weeks   Barriers to success: none   Plan for overcoming my barriers: N/A      Confidence: 10/10   Date goal set: 10/25/14   Date expected to reach goal:       Health Maintenance Due   Topic Date Due   ??? CIGARETTE SMOKER  Oct 09, 1965   ??? HEPATITIS  C SCREENING  Oct 09, 1965   ??? TETANUS VACCINE ADULT (11 YEARS AND UP)  02/22/1975   ??? HIV SCREENING  02/22/1979   ??? PNEUMOVAX 1 DOSE 19-64Y (1) 02/21/1982   ??? COLON CANCER SCREENING COLONOSCOPY  02/21/2014

## 2014-10-26 ENCOUNTER — Telehealth

## 2014-10-26 MED ORDER — TRAMADOL HCL 50 MG PO TABS
50 MG | ORAL_TABLET | Freq: Three times a day (TID) | ORAL | Status: DC | PRN
Start: 2014-10-26 — End: 2014-11-05

## 2014-10-26 NOTE — Telephone Encounter (Signed)
Can try some Tramadol.

## 2014-10-26 NOTE — Telephone Encounter (Signed)
Patient states his right hip is bothering him very bad today and would like to know if you can give him something for the pain? Please advise. Thank you    Health Maintenance   Topic Date Due   ??? CIGARETTE SMOKER  11/24/2014   ??? FLU VACCINE YEARLY (ADULT)  01/31/2015   ??? LIPIDS  09/19/2018   ??? TETANUS VACCINE ADULT (11 YEARS AND UP)  10/24/2024   ??? COLON CANCER SCREENING COLONOSCOPY  10/24/2024   ??? PNEUMOVAX 1 DOSE 19-64Y (2) 02/21/2029   ??? HEPATITIS  C SCREENING  Addressed   ??? HIV SCREENING  Addressed       No results found for: LABA1C          ( goal A1C is < 7)   No results found for: LABMICR  LDL CHOLESTEROL (mg/dL)   Date Value   13/08/657803/20/2015 48        (goal LDL is <100)   No results found for: AST, ALT  BP Readings from Last 3 Encounters:   10/25/14 156/84   02/11/14 136/84   09/16/13 130/70          (goal 120/80)      Next Visit Date:  11/05/2014    Patient Active Problem List:     Nicotine addiction     GERD (gastroesophageal reflux disease)     Claudication in peripheral vascular disease (HCC)     Arthralgia of right hip     Need for pneumococcal vaccination     Need for Tdap vaccination

## 2014-10-26 NOTE — Telephone Encounter (Signed)
Patient notified.

## 2014-10-28 ENCOUNTER — Ambulatory Visit (INDEPENDENT_AMBULATORY_CARE_PROVIDER_SITE_OTHER): Payer: Medicare Other | Admitting: Orthopedic Surgery

## 2014-10-28 ENCOUNTER — Encounter: Payer: Self-pay | Admitting: Orthopedic Surgery

## 2014-10-28 VITALS — BP 153/94 | Ht 68.0 in | Wt 273.0 lb

## 2014-10-28 DIAGNOSIS — M1712 Unilateral primary osteoarthritis, left knee: Secondary | ICD-10-CM | POA: Diagnosis not present

## 2014-10-28 MED ORDER — HYDROCODONE-ACETAMINOPHEN 7.5-325 MG PO TABS: 1.0000 | ORAL_TABLET | Freq: Four times a day (QID) | ORAL | Status: DC | PRN

## 2014-10-28 NOTE — Progress Notes (Signed)
Patient ID: Terry Case, male   DOB: 1966-05-13, 49 y.o.   MRN: [id number] Chief Complaint  Patient presents with  . Follow-up    6 week follow up Left knee response to medication    Encounter Diagnosis  Name Primary?  . Primary osteoarthritis of left knee Yes     Current outpatient prescriptions:  .  famotidine (PEPCID) 20 MG tablet, Take 20 mg by mouth 2 (two) times daily., Disp: , Rfl:  .  HYDROcodone-acetaminophen (NORCO) 7.5-325 MG per tablet, Take 1 tablet by mouth every 6 (six) hours as needed (pain)., Disp: 120 tablet, Rfl: 0 .  Ibuprofen (ADVIL PO), Take by mouth., Disp: , Rfl:  .  Naproxen-Esomeprazole 500-20 MG TBEC, Take by mouth., Disp: , Rfl:  .  phentermine 37.5 MG capsule, Take 37.5 mg by mouth every morning., Disp: , Rfl:  .  HYDROcodone-acetaminophen (NORCO) 7.5-325 MG per tablet, Take 1 tablet by mouth every 6 (six) hours as needed for moderate pain., Disp: 120 tablet, Rfl: 0  [name] had a good checkup today he says to Norco 7.5 mg along with ibuprofen is controlling his pain. He also notes increased knee flexion and increased ability to ambulate and is requesting permission to ambulate for exercise  At 49 years old we both know that he will need knee replacement surgery but has long as we can hold him off with medication that is the better choice even in the setting of chronic opioid use  We have both aware of those risks as well and he is managing his medicine well with no problems. He also took some phentermine and lost 7 pounds which is good and I'm sure is helping his knee  Musculoskeletal system review is normal  Physical examination: BP 153/94 mmHg  Ht 5\' 8"  (1.727 m)  Wt 273 lb (123.832 kg)  BMI 41.52 kg/m2  Left knee exam   Inspection no swelling mild tenderness medial compartment    knee flexion has improved to 125    knee is stable    motor exam is normal    neurovascular exam is intact   Recommend continue hydrocodone 7.5 mg  and continue ibuprofen as needed  Follow-up 3 months

## 2014-10-29 ENCOUNTER — Inpatient Hospital Stay: Admit: 2014-10-29 | Attending: Family Medicine | Primary: Internal Medicine

## 2014-10-29 ENCOUNTER — Encounter

## 2014-10-29 DIAGNOSIS — I739 Peripheral vascular disease, unspecified: Secondary | ICD-10-CM

## 2014-10-29 LAB — BASIC METABOLIC PANEL
Anion Gap: 12 mmol/L (ref 9–17)
BUN: 10 mg/dL (ref 6–20)
Bun/Cre Ratio: 8 — ABNORMAL LOW (ref 9–20)
CO2: 25 mmol/L (ref 20–31)
Calcium: 9.7 mg/dL (ref 8.6–10.4)
Chloride: 103 mmol/L (ref 98–107)
Creatinine: 1.25 mg/dL — ABNORMAL HIGH (ref 0.70–1.20)
GFR African American: >60 mL/min (ref >60)
GFR Non-African American: >60 mL/min (ref >60)
Glucose: 86 mg/dL (ref 70–99)
Potassium: 4.2 mmol/L (ref 3.7–5.3)
Sodium: 140 mmol/L (ref 135–144)

## 2014-10-29 LAB — LIPID PANEL
Chol/HDL Ratio: 3.1 (ref <5)
Cholesterol: 96 mg/dL (ref <200)
HDL: 31 mg/dL — ABNORMAL LOW (ref >40)
LDL Cholesterol: 47 mg/dL (ref 0–130)
Triglycerides: 89 mg/dL (ref <150)
VLDL: 18 mg/dL (ref 1–30)

## 2014-10-29 LAB — PATIENT FASTING?

## 2014-11-05 ENCOUNTER — Ambulatory Visit
Admit: 2014-11-05 | Discharge: 2014-11-05 | Payer: PRIVATE HEALTH INSURANCE | Attending: Family Medicine | Primary: Internal Medicine

## 2014-11-05 DIAGNOSIS — I739 Peripheral vascular disease, unspecified: Secondary | ICD-10-CM

## 2014-11-05 MED ORDER — TRAMADOL HCL 50 MG PO TABS
50 MG | ORAL_TABLET | Freq: Three times a day (TID) | ORAL | Status: AC | PRN
Start: 2014-11-05 — End: 2014-11-15

## 2014-11-05 NOTE — Progress Notes (Signed)
Have you changed or stopped any medications since your last visit including any over-the-counter medicines, vitamins, or herbal medicines? no     Are you taking all your prescribed medications? Yes          If NO, why? -  N/A    Have you seen any other physician or provider since your last visit no    Have you had any other diagnostic tests since your last visit? yes - labs and US    Tobacco use:  Patient  reports that he has been smoking Cigarettes.  He has been smoking about 2.00 packs per day. He does not have any smokeless tobacco history on file.   If a smoker, cessation materials provided? No   1-800-QUIT-NOW [phone])     Medical history Review  Past Medical, Family, and Social History reviewed and does contribute to the patient presenting condition    There are no preventive care reminders to display for this patient.

## 2014-11-05 NOTE — Progress Notes (Signed)
Subjective:      Patient ID: Mark Buchanan is a 49 y.o. male.    HPI  Patient presented at the office for follow up and to discuss the results of his arterial doppler US-right leg and blood works.he still complaining of leg pain specially when walking and the Pletal did not seems to make any difference.his right hip pain seems to be responding to Tramadol;he is requesting a refill for the Tramadol 50 mg 3x /day,PRN.his BP had improved;his reading at the office today is 120/80 mm. Hg;he denies any other new complaint.  Review of Systems  As on HPI  Objective:   Physical Exam   Constitutional: He is oriented to person, place, and time. He appears well-developed and well-nourished.   Cardiovascular: Normal rate and regular rhythm.    Pulmonary/Chest: Effort normal and breath sounds normal.   Abdominal: Soft. Bowel sounds are normal.   Musculoskeletal: Normal range of motion.   Neurological: He is alert and oriented to person, place, and time.   Skin: Skin is warm.   Psychiatric: He has a normal mood and affect. His behavior is normal. Judgment and thought content normal.   Nursing note and vitals reviewed.  Vl Lower Extremity Arterial Segmental Pressures W Ppg    11/02/2014      [facility]  [facility] Lower Arterial Plethysm[facility] Procedure    Patient Name  Mark Buchanan    Date of Study         10/29/2014                Mark Buchanan    Date of Birth [birth year]   Gender                Male    Age           49 year(s)   Race                  Caucasian    Room Number    Corporate ID  [id number]  #    Patient Acct  [id number]  #    MR #          [id number]       Sonographer           Yehuda MaoCarla Wilson, [place]    Accession #   [id number]  Interpreting          Gust Broomsrago,D. Vincent                             Physician    Referring                  Referring Physician   Illa LevelEVILLO Danecia Underdown, [facility] *  Nurse  Practitioner   Procedure Type of Study:    Extremities Arteries: Lower Arterial Plethysmography, PVR Lower.   Indications for  Study:Claudication.  Comments:Single large cuff utilized on patient thigh due to body habitus  Patient Status:Out Patient.  Velocities are measured in cm/s ; Diameters are measured in mm  Pressures                                                                     Right  Left    +--------------------------------------------------------------+  +--------------------------------+-----------------------------+  +--------------------------------+-----------------------------+  !Location                                                      ! !Pressure                         !Ratio                        ! !Pressure                        !Ratio                         !  +--------------------------------------------------------------+  +--------------------------------+-----------------------------+  +--------------------------------+-----------------------------+  !Low Thigh                                                     ! !74                               !0.45                         ! !155                             !0.94                          !  +--------------------------------------------------------------+  +--------------------------------+-----------------------------+  +--------------------------------+-----------------------------+  !Calf                                                          ! !72                               !0.44                         ! !169                             !1.02                          !  +--------------------------------------------------------------+  +--------------------------------+-----------------------------+  +--------------------------------+-----------------------------+  !Ankle PT                                                      ! !78                               !  0.47                         ! !180                             !1.09                          !   +--------------------------------------------------------------+  +--------------------------------+-----------------------------+  +--------------------------------+-----------------------------+  !Ankle DP                                                      ! !24                               !0.15                         ! !178                             !1.08                          !  +--------------------------------------------------------------+  +--------------------------------+-----------------------------+  +--------------------------------+-----------------------------+  !Great Toe                                                     ! !35                               !0.21                         ! !140                             !0.85                          !  +--------------------------------------------------------------+  +--------------------------------+-----------------------------+  +--------------------------------+-----------------------------+     - Brachial Pressure:Right: 153 mmHgLeft:165 mmHg    - ZOX:WRUEAABI:Right: 0.47.Left: 1.09.  Plethysmographic Digit Evaluation                                                                     Right                                                             Left    +--------------------------------------------------------------+  +--------+-----+-----------------------------------------------+  +--------+-----+-----------------------------------------------+  !  Location                                                      ! !Pressure!Ratio!PPG Wave Form                                   ! !Pressure!Ratio!PPG Wave Form                                   !  +--------------------------------------------------------------+  +--------+-----+-----------------------------------------------+  +--------+-----+-----------------------------------------------+  !Great Toe                                                     ! !35      !0.21 !                                                 ! !140     !0.85 !                                                !  +--------------------------------------------------------------+  +--------+-----+-----------------------------------------------+  +--------+-----+-----------------------------------------------+   Post Exercise Exercise Time: 0 min.   Conclusions    Summary    New (compared to 2009) severe, multi-level, arterial insufficiency right  lower extremity, including iliac level disease.    Right ABI: 0.47.  Left ABI: 1.09.    Right great toe pressure of 35 mmHg not high enough to support wound  healing if any foot ulceration were to occur.    Recommendations    Recommend vascular surgery consultation and work-up with CT angio/runoff  to determine possibility of stenting or angioplasty correction.    Call status.    Signature    ----------------------------------------------------------------  Electronically signed by Yehuda Mao,  RT(R)(M)(CT)(RDMS)(Sonographer) on 10/29/2014 01:49 PM  ----------------------------------------------------------------    ----------------------------------------------------------------  Electronically signed by Marlowe Sax. Vincent(Interpreting  physician) on 11/02/2014 08:57 AM  ----------------------------------------------------------------        Assessment:      1. Claudication in peripheral vascular disease (HCC)  traMADol (ULTRAM) 50 MG tablet   2. ASVD (arteriosclerotic vascular disease)  External Referral To Vascular Surgery   3. Arthralgia of right hip  traMADol (ULTRAM) 50 MG tablet   4. Essential hypertension              Plan:      Requested Prescriptions     Signed Prescriptions Disp Refills   ??? traMADol (ULTRAM) 50 MG tablet 30 tablet 0     Sig: Take 1 tablet by mouth every 8 hours as needed for Pain   refilled requested medication and continue;continue all other current medications.discussed the result of his blood work and arterial doppler US;refer him to vascular surgery for further  evaluation and mgt;follow up after 3 months or  as needed.Nursing notes and vitals reviewed.    MEDICAL HISTORY REVIEW    Medical, family, social and past history reviewed. This does contribute to patient's present condition.  i spent >15 minutes with patient discussing the results of his test results and explaining mgt and etiology of his condition.

## 2014-11-23 DIAGNOSIS — M79672 Pain in left foot: Secondary | ICD-10-CM | POA: Diagnosis not present

## 2014-11-23 DIAGNOSIS — M545 Low back pain: Secondary | ICD-10-CM | POA: Diagnosis not present

## 2014-12-23 ENCOUNTER — Encounter: Attending: Family Medicine | Primary: Internal Medicine

## 2014-12-23 NOTE — Telephone Encounter (Signed)
Patients wife left a message on the office phone canceling the patient's appointment for today. I called the patient to reschedule, no answer and no answering machine.

## 2015-01-03 ENCOUNTER — Encounter: Admit: 2015-01-03 | Primary: Internal Medicine

## 2015-01-03 ENCOUNTER — Inpatient Hospital Stay
Admit: 2015-01-03 | Discharge: 2015-01-03 | Disposition: A | Discharge status: Home / Self Care | Attending: Emergency Medicine

## 2015-01-03 DIAGNOSIS — M545 Low back pain, unspecified: Secondary | ICD-10-CM

## 2015-01-03 MED ORDER — KETOROLAC TROMETHAMINE 30 MG/ML IJ SOLN
30 MG/ML | Freq: Once | INTRAMUSCULAR | Status: AC
Start: 2015-01-03 — End: 2015-01-03
  Administered 2015-01-03: 19:00:00 30 mg via INTRAMUSCULAR

## 2015-01-03 MED ORDER — HYDROCODONE-ACETAMINOPHEN 7.5-325 MG PO TABS
ORAL_TABLET | Freq: Four times a day (QID) | ORAL | 0 refills | Status: AC | PRN
Start: 2015-01-03 — End: 2015-01-04

## 2015-01-03 MED FILL — KETOROLAC TROMETHAMINE 30 MG/ML IJ SOLN: 30 MG/ML | INTRAMUSCULAR | Qty: 1

## 2015-01-03 NOTE — ED Triage Notes (Signed)
Med rec completed with pt. As historian

## 2015-01-03 NOTE — ED Notes (Signed)
Patient unable to give urine specimen.  Patient instructed to follow up with PCP.  Given urine specimen cup to take home.      Coralie CarpenAmy J Salyer, RN  01/03/15 1655

## 2015-01-03 NOTE — Other (Addendum)
Patient Acct Nbr:  [id number]  Primary AUTH/CERT:    [facility]   [facility]  Primary Insurance Plan Name:  [employer]  Primary Insurance Group Number:  [id number]  Primary Insurance Plan Type: O  Primary Insurance Policy Number:  [id number]

## 2015-01-03 NOTE — ED Provider Notes (Signed)
Patient is a 49 y.o. male presenting with back pain. The history is provided by the patient.   Back Pain   Location:  Lumbar spine and thoracic spine  Quality:  Aching  Radiates to:  Does not radiate  Pain severity:  Mild  Pain is:  Worse during the night  Onset quality:  Gradual  Duration:  2 days  Timing:  Intermittent  Progression:  Waxing and waning  Chronicity:  Recurrent  Context: twisting    Context: not emotional stress, not falling, not jumping from heights, not lifting heavy objects, not MCA, not MVA, not occupational injury, not pedestrian accident, not physical stress, not recent illness and not recent injury    Relieved by:  Nothing  Worsened by:  Nothing  Ineffective treatments:  None tried  Associated symptoms: no abdominal pain, no abdominal swelling, no bladder incontinence, no bowel incontinence, no chest pain, no dysuria, no fever, no headaches, no leg pain, no numbness, no pelvic pain, no perianal numbness, no tingling, no weakness and no weight loss    Risk factors: recent surgery and vascular disease    Risk factors: no hx of osteoporosis, no lack of exercise and not obese        Review of Systems   Constitutional: Negative.  Negative for fever and weight loss.   HENT: Negative.    Eyes: Negative.    Respiratory: Negative.    Cardiovascular: Negative for chest pain.   Gastrointestinal: Negative for abdominal distention, abdominal pain, anal bleeding, blood in stool, bowel incontinence, constipation, diarrhea, nausea, rectal pain and vomiting.   Endocrine: Negative.    Genitourinary: Positive for flank pain. Negative for bladder incontinence, decreased urine volume, difficulty urinating, discharge, dysuria, enuresis, frequency, genital sores, hematuria, pelvic pain, penile pain, penile swelling, scrotal swelling, testicular pain and urgency.   Musculoskeletal: Positive for back pain.   Skin: Negative.    Allergic/Immunologic: Negative.    Neurological: Negative.  Negative for tingling,  weakness, numbness and headaches.   Hematological: Negative.    Psychiatric/Behavioral: Negative.        Physical Exam   Constitutional: He is oriented to person, place, and time. He appears well-developed and well-nourished.   HENT:   Head: Normocephalic and atraumatic.   Eyes: Conjunctivae and EOM are normal. Pupils are equal, round, and reactive to light.   Neck: Normal range of motion. Neck supple.   Cardiovascular: Normal rate, normal heart sounds and intact distal pulses.    Pulmonary/Chest: Effort normal and breath sounds normal.   Abdominal: Soft. Bowel sounds are normal.   Musculoskeletal: He exhibits tenderness.   Paraspinal lumbar tenderness on right sided of t12 and l1 region   Neurological: He is alert and oriented to person, place, and time. He has normal reflexes.   Skin: Skin is warm.   Psychiatric: He has a normal mood and affect. His behavior is normal. Judgment and thought content normal.       Procedures    MDM  Number of Diagnoses or Management Options  Acute right-sided low back pain without sciatica:   Diagnosis management comments: Patients back hurts mainly with movement. Patient already urinated prior to arrival. He will be sent home and follow with his primary care doctor and he can give the urine sample at the time       Amount and/or Complexity of Data Reviewed  Clinical lab tests: ordered and reviewed  Tests in the radiology section of CPT??: ordered and reviewed    Risk of  Complications, Morbidity, and/or Mortality  Presenting problems: moderate  Diagnostic procedures: moderate  Management options: moderate    Patient Progress  Patient progress: stable      Labs      Radiology  Ct a/p: nothing acute to explain symptoms    EKG Interpretation.        Summation      Patient Course:  Patient declined the ua in the ed    ED Medications administered this visit:    Medications   ketorolac (TORADOL) injection 30 mg (30 mg Intramuscular Given 01/03/15 1525)       New Prescriptions from this visit:     New Prescriptions    HYDROCODONE-ACETAMINOPHEN (NORCO) 7.5-325 MG PER TABLET    Take 1 tablet by mouth every 6 hours as needed for Pain       Follow-up:  Demetrio Lapping, MD  [address] [place] [address]  [phone]    Call in 2 days          Final Impression:   1. Acute right-sided low back pain without sciatica               (Please note that portions of this note were completed with a voice recognition program.  Efforts were made to edit the dictations but occasionally words are mis-transcribed.)     Verne Carrow, MD  01/03/15 1640

## 2015-01-03 NOTE — Discharge Instructions (Signed)
Learning About Relief for Back Pain  What is back tension and strain?     Back strain happens when you overstretch, or pull, a muscle in your back. You may hurt your back in an accident or when you exercise or lift something.  Most back pain will get better with rest and time. You can take care of yourself at home to help your back heal.  What can you do first to relieve back pain?  When you first feel back pain, try these steps:   Walk. Take a short walk (10 to 20 minutes) on a level surface (no slopes, hills, or stairs) every 2 to 3 hours. Walk only distances you can manage without pain, especially leg pain.   Relax. Find a comfortable position for rest. Some people are comfortable on the floor or a medium-firm bed with a small pillow under their head and another under their knees. Some people prefer to lie on their side with a pillow between their knees. Don't stay in one position for too long.   Try heat or ice. Try using a heating pad on a low or medium setting, or take a warm shower, for 15 to 20 minutes every 2 to 3 hours. Or you can buy single-use heat wraps that last up to 8 hours. You can also try an ice pack for 10 to 15 minutes every 2 to 3 hours. You can use an ice pack or a bag of frozen vegetables wrapped in a thin towel. There is not strong evidence that either heat or ice will help, but you can try them to see if they help. You may also want to try switching between heat and cold.   Take pain medicine exactly as directed.   If the doctor gave you a prescription medicine for pain, take it as prescribed.   If you are not taking a prescription pain medicine, ask your doctor if you can take an over-the-counter medicine.  What else can you do?   Stretch and exercise. Exercises that increase flexibility may relieve your pain and make it easier for your muscles to keep your spine in a good, neutral position. And don't forget to keep walking.   Do self-massage. You can use self-massage to unwind  after work or school or to energize yourself in the morning. You can easily massage your feet, hands, or neck. Self-massage works best if you are in comfortable clothes and are sitting or lying in a comfortable position. Use oil or lotion to massage bare skin.   Reduce stress. Back pain can lead to a vicious circle: Distress about the pain tenses the muscles in your back, which in turn causes more pain. Learn how to relax your mind and your muscles to lower your stress.   Where can you learn more?   Go to [email or web address] and sign in to your MyChart account. Enter Q517 in the Search Health Information box to learn more about "Learning About Relief for Back Pain."    If you do not have an account, please click on the "Sign Up Now" link.    2006-2016 [employer]. Care instructions adapted under license by [facility]. This care instruction is for use with your licensed healthcare professional. If you have questions about a medical condition or this instruction, always ask your healthcare professional. Healthwise, Incorporated disclaims any warranty or liability for your use of this information.  Content Version: 10.8.513193; Current as of: Nov 20, 2013

## 2015-02-03 ENCOUNTER — Ambulatory Visit (INDEPENDENT_AMBULATORY_CARE_PROVIDER_SITE_OTHER): Payer: Medicare Other | Admitting: Orthopedic Surgery

## 2015-02-03 ENCOUNTER — Encounter: Payer: Self-pay | Admitting: Orthopedic Surgery

## 2015-02-03 VITALS — BP 146/90 | Ht 68.0 in | Wt 270.0 lb

## 2015-02-03 DIAGNOSIS — M1712 Unilateral primary osteoarthritis, left knee: Secondary | ICD-10-CM | POA: Diagnosis not present

## 2015-02-03 MED ORDER — HYDROCODONE-ACETAMINOPHEN 7.5-325 MG PO TABS: 1.0000 | ORAL_TABLET | Freq: Four times a day (QID) | ORAL | Status: DC | PRN

## 2015-02-03 NOTE — Progress Notes (Signed)
Patient ID: Terry Case, male   DOB: Feb 08, 1966, 49 y.o.   MRN: [id number]  Follow up visit  Chief Complaint  Patient presents with  . Follow-up    3 month follow up left knee, priamry OA  on medication    BP 146/90 mmHg  Ht 5\' 8"  (1.727 m)  Wt 270 lb (122.471 kg)  BMI 41.06 kg/m2  Encounter Diagnosis  Name Primary?  . Primary osteoarthritis of left knee Yes     49 year old male with severe osteoarthritis of his left knee with mild varus alignment who is currently managed with hydrocodone and ibuprofen. He comes in for 3 month  Follow-up.    the patient is doing very well with this medication at 7.5 mg hydrocodone and ibuprofen as needed. He is on the medication helped him lose weight  Review of systems sleep apnea difficulty breathing at night currently will have a workup   knee exam shows he is walking with no supportive has a varus alignment no thrust has a flexion contracture of about 8 his knee flexion is about 115. Knee stable. Medial joint line tenderness without effusion skin is intact distal pulses are normal   stable osteoarthritis left knee  I would like an x-ray when he comes back next time we refilled his hydrocodone 7.5 mg continue that as well as  Ibuprofen.

## 2015-02-13 ENCOUNTER — Ambulatory Visit (HOSPITAL_BASED_OUTPATIENT_CLINIC_OR_DEPARTMENT_OTHER): Payer: Medicare Other

## 2015-02-15 ENCOUNTER — Other Ambulatory Visit (HOSPITAL_COMMUNITY)
Admission: RE | Admit: 2015-02-15 | Discharge: 2015-02-15 | Disposition: A | Discharge status: Home / Self Care | Payer: Medicare Other | Source: Ambulatory Visit | Attending: Family Medicine | Admitting: Family Medicine

## 2015-02-15 ENCOUNTER — Other Ambulatory Visit: Payer: Medicare Other | Admitting: Family Medicine

## 2015-02-15 DIAGNOSIS — E782 Mixed hyperlipidemia: Secondary | ICD-10-CM | POA: Diagnosis not present

## 2015-02-15 DIAGNOSIS — A5900 Urogenital trichomoniasis, unspecified: Secondary | ICD-10-CM | POA: Diagnosis not present

## 2015-02-15 DIAGNOSIS — Z113 Encounter for screening for infections with a predominantly sexual mode of transmission: Secondary | ICD-10-CM | POA: Diagnosis not present

## 2015-02-15 DIAGNOSIS — K7290 Hepatic failure, unspecified without coma: Secondary | ICD-10-CM | POA: Diagnosis not present

## 2015-02-15 DIAGNOSIS — R7309 Other abnormal glucose: Secondary | ICD-10-CM | POA: Diagnosis not present

## 2015-02-15 DIAGNOSIS — N429 Disorder of prostate, unspecified: Secondary | ICD-10-CM | POA: Diagnosis not present

## 2015-02-18 LAB — URINE CYTOLOGY ANCILLARY ONLY
Bacterial vaginitis: NEGATIVE
CHLAMYDIA, DNA PROBE: NEGATIVE
Candida vaginitis: NEGATIVE
Neisseria Gonorrhea: NEGATIVE

## 2015-03-04 ENCOUNTER — Emergency Department (HOSPITAL_COMMUNITY)
Admission: EM | Admit: 2015-03-04 | Discharge: 2015-03-04 | Disposition: A | Discharge status: Home / Self Care | Payer: Medicare Other | Attending: Physician Assistant | Admitting: Physician Assistant

## 2015-03-04 ENCOUNTER — Encounter (HOSPITAL_COMMUNITY): Payer: Self-pay | Admitting: *Deleted

## 2015-03-04 DIAGNOSIS — J45909 Unspecified asthma, uncomplicated: Secondary | ICD-10-CM | POA: Insufficient documentation

## 2015-03-04 DIAGNOSIS — G8929 Other chronic pain: Secondary | ICD-10-CM | POA: Diagnosis not present

## 2015-03-04 DIAGNOSIS — K219 Gastro-esophageal reflux disease without esophagitis: Secondary | ICD-10-CM | POA: Diagnosis not present

## 2015-03-04 DIAGNOSIS — Z8701 Personal history of pneumonia (recurrent): Secondary | ICD-10-CM | POA: Insufficient documentation

## 2015-03-04 DIAGNOSIS — Z79899 Other long term (current) drug therapy: Secondary | ICD-10-CM | POA: Insufficient documentation

## 2015-03-04 DIAGNOSIS — H578 Other specified disorders of eye and adnexa: Secondary | ICD-10-CM | POA: Diagnosis not present

## 2015-03-04 DIAGNOSIS — T7840XA Allergy, unspecified, initial encounter: Secondary | ICD-10-CM | POA: Diagnosis not present

## 2015-03-04 DIAGNOSIS — Z87891 Personal history of nicotine dependence: Secondary | ICD-10-CM | POA: Diagnosis not present

## 2015-03-04 DIAGNOSIS — Z872 Personal history of diseases of the skin and subcutaneous tissue: Secondary | ICD-10-CM | POA: Diagnosis not present

## 2015-03-04 DIAGNOSIS — H5710 Ocular pain, unspecified eye: Secondary | ICD-10-CM | POA: Insufficient documentation

## 2015-03-04 DIAGNOSIS — M542 Cervicalgia: Secondary | ICD-10-CM | POA: Diagnosis not present

## 2015-03-04 DIAGNOSIS — M25512 Pain in left shoulder: Secondary | ICD-10-CM | POA: Insufficient documentation

## 2015-03-04 DIAGNOSIS — M1990 Unspecified osteoarthritis, unspecified site: Secondary | ICD-10-CM | POA: Diagnosis not present

## 2015-03-04 DIAGNOSIS — J309 Allergic rhinitis, unspecified: Secondary | ICD-10-CM

## 2015-03-04 MED ORDER — IBUPROFEN 800 MG PO TABS: 800.0000 mg | ORAL_TABLET | Freq: Three times a day (TID) | ORAL | Status: DC

## 2015-03-04 MED ORDER — LORATADINE 10 MG PO TABS: 10.0000 mg | ORAL_TABLET | Freq: Every day | ORAL | Status: DC

## 2015-03-04 MED ORDER — METHOCARBAMOL 500 MG PO TABS: 1000.0000 mg | ORAL_TABLET | Freq: Every day | ORAL | Status: DC
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
## 2015-03-04 NOTE — ED Notes (Signed)
Discharge papers reviewed- Discussed use of pain medication and muscle relaxer- Also allergy medication - Pt verbalized understanding and re interated back to this nurse- Ambulated off unit

## 2015-03-04 NOTE — ED Notes (Signed)
Pt c/o left sided neck and shoulder pain x 2 weeks. Pt does not remember injuring himself. Pt also c/o sinus and chest congestion x 1 week that is not relieved with cough medicine.

## 2015-03-04 NOTE — Discharge Instructions (Signed)
Allergies °Allergies may happen from anything your body is sensitive to. This may be food, medicines, pollens, chemicals, and nearly anything around you in everyday life that produces allergens. An allergen is anything that causes an allergy producing substance. Heredity is often a factor in causing these problems. This means you may have some of the same allergies as your parents. °Food allergies happen in all age groups. Food allergies are some of the most severe and life threatening. Some common food allergies are cow's milk, seafood, eggs, nuts, wheat, and soybeans. °SYMPTOMS  °· Swelling around the mouth. °· An itchy red rash or hives. °· Vomiting or diarrhea. °· Difficulty breathing. °SEVERE ALLERGIC REACTIONS ARE LIFE-THREATENING. °This reaction is called anaphylaxis. It can cause the mouth and throat to swell and cause difficulty with breathing and swallowing. In severe reactions only a trace amount of food (for example, peanut oil in a salad) may cause death within seconds. °Seasonal allergies occur in all age groups. These are seasonal because they usually occur during the same season every year. They may be a reaction to molds, grass pollens, or tree pollens. Other causes of problems are house dust mite allergens, pet dander, and mold spores. The symptoms often consist of nasal congestion, a runny itchy nose associated with sneezing, and tearing itchy eyes. There is often an associated itching of the mouth and ears. The problems happen when you come in contact with pollens and other allergens. Allergens are the particles in the air that the body reacts to with an allergic reaction. This causes you to release allergic antibodies. Through a chain of events, these eventually cause you to release histamine into the blood stream. Although it is meant to be protective to the body, it is this release that causes your discomfort. This is why you were given anti-histamines to feel better.  If you are unable to  pinpoint the offending allergen, it may be determined by skin or blood testing. Allergies cannot be cured but can be controlled with medicine. °Hay fever is a collection of all or some of the seasonal allergy problems. It may often be treated with simple over-the-counter medicine such as diphenhydramine. Take medicine as directed. Do not drink alcohol or drive while taking this medicine. Check with your caregiver or package insert for child dosages. °If these medicines are not effective, there are many new medicines your caregiver can prescribe. Stronger medicine such as nasal spray, eye drops, and corticosteroids may be used if the first things you try do not work well. Other treatments such as immunotherapy or desensitizing injections can be used if all else fails. Follow up with your caregiver if problems continue. These seasonal allergies are usually not life threatening. They are generally more of a nuisance that can often be handled using medicine. °HOME CARE INSTRUCTIONS  °· If unsure what causes a reaction, keep a diary of foods eaten and symptoms that follow. Avoid foods that cause reactions. °· If hives or rash are present: °· Take medicine as directed. °· You may use an over-the-counter antihistamine (diphenhydramine) for hives and itching as needed. °· Apply cold compresses (cloths) to the skin or take baths in cool water. Avoid hot baths or showers. Heat will make a rash and itching worse. °· If you are severely allergic: °· Following a treatment for a severe reaction, hospitalization is often required for closer follow-up. °· Wear a medic-alert bracelet or necklace stating the allergy. °· You and your family must learn how to give adrenaline or use   an anaphylaxis kit.  If you have had a severe reaction, always carry your anaphylaxis kit or EpiPen with you. Use this medicine as directed by your caregiver if a severe reaction is occurring. Failure to do so could have a fatal outcome. SEEK MEDICAL  CARE IF:  You suspect a food allergy. Symptoms generally happen within 30 minutes of eating a food.  Your symptoms have not gone away within 2 days or are getting worse.  You develop new symptoms.  You want to retest yourself or your child with a food or drink you think causes an allergic reaction. Never do this if an anaphylactic reaction to that food or drink has happened before. Only do this under the care of a caregiver. SEEK IMMEDIATE MEDICAL CARE IF:   You have difficulty breathing, are wheezing, or have a tight feeling in your chest or throat.  You have a swollen mouth, or you have hives, swelling, or itching all over your body.  You have had a severe reaction that has responded to your anaphylaxis kit or an EpiPen. These reactions may return when the medicine has worn off. These reactions should be considered life threatening. MAKE SURE YOU:   Understand these instructions.  Will watch your condition.  Will get help right away if you are not doing well or get worse. Document Released: 09/11/2002 Document Revised: 10/13/2012 Document Reviewed: 02/16/2008 [facility] Patient Information 2015 [place], [place]. This information is not intended to replace advice given to you by your health care provider. Make sure you discuss any questions you have with your health care provider. Heat Therapy Heat therapy can help ease sore, stiff, injured, and tight muscles and joints. Heat relaxes your muscles, which may help ease your pain.  RISKS AND COMPLICATIONS If you have any of the following conditions, do not use heat therapy unless your health care provider has approved:  Poor circulation.  Healing wounds or scarred skin in the area being treated.  Diabetes, heart disease, or high blood pressure.  Not being able to feel (numbness) the area being treated.  Unusual swelling of the area being treated.  Active infections.  Blood clots.  Cancer.  Inability to communicate pain. This  may include young children and people who have problems with their brain function (dementia).  Pregnancy. Heat therapy should only be used on old, pre-existing, or long-lasting (chronic) injuries. Do not use heat therapy on new injuries unless directed by your health care provider. HOW TO USE HEAT THERAPY There are several different kinds of heat therapy, including:  Moist heat pack.  Warm water bath.  Hot water bottle.  Electric heating pad.  Heated gel pack.  Heated wrap.  Electric heating pad. Use the heat therapy method suggested by your health care provider. Follow your health care provider's instructions on when and how to use heat therapy. GENERAL HEAT THERAPY RECOMMENDATIONS  Do not sleep while using heat therapy. Only use heat therapy while you are awake.  Your skin may turn pink while using heat therapy. Do not use heat therapy if your skin turns red.  Do not use heat therapy if you have new pain.  High heat or long exposure to heat can cause burns. Be careful when using heat therapy to avoid burning your skin.  Do not use heat therapy on areas of your skin that are already irritated, such as with a rash or sunburn. SEEK MEDICAL CARE IF:  You have blisters, redness, swelling, or numbness.  You have new pain.  Your pain is worse. MAKE SURE YOU:  Understand these instructions.  Will watch your condition.  Will get help right away if you are not doing well or get worse. Document Released: 09/10/2011 Document Revised: 11/02/2013 Document Reviewed: 08/11/2013 [facility] Patient Information 2015 [place], [place]. This information is not intended to replace advice given to you by your health care provider. Make sure you discuss any questions you have with your health care provider. Muscle Strain A muscle strain is an injury that occurs when a muscle is stretched beyond its normal length. Usually a small number of muscle fibers are torn when this happens. Muscle strain  is rated in degrees. First-degree strains have the least amount of muscle fiber tearing and pain. Second-degree and third-degree strains have increasingly more tearing and pain.  Usually, recovery from muscle strain takes 1-2 weeks. Complete healing takes 5-6 weeks.  CAUSES  Muscle strain happens when a sudden, violent force placed on a muscle stretches it too far. This may occur with lifting, sports, or a fall.  RISK FACTORS Muscle strain is especially common in athletes.  SIGNS AND SYMPTOMS At the site of the muscle strain, there may be:  Pain.  Bruising.  Swelling.  Difficulty using the muscle due to pain or lack of normal function. DIAGNOSIS  Your health care provider will perform a physical exam and ask about your medical history. TREATMENT  Often, the best treatment for a muscle strain is resting, icing, and applying cold compresses to the injured area.  HOME CARE INSTRUCTIONS   Use the PRICE method of treatment to promote muscle healing during the first 2-3 days after your injury. The PRICE method involves:  Protecting the muscle from being injured again.  Restricting your activity and resting the injured body part.  Icing your injury. To do this, put ice in a plastic bag. Place a towel between your skin and the bag. Then, apply the ice and leave it on from 15-20 minutes each hour. After the third day, switch to moist heat packs.  Apply compression to the injured area with a splint or elastic bandage. Be careful not to wrap it too tightly. This may interfere with blood circulation or increase swelling.  Elevate the injured body part above the level of your heart as often as you can.  Only take over-the-counter or prescription medicines for pain, discomfort, or fever as directed by your health care provider.  Warming up prior to exercise helps to prevent future muscle strains. SEEK MEDICAL CARE IF:   You have increasing pain or swelling in the injured area.  You have  numbness, tingling, or a significant loss of strength in the injured area. MAKE SURE YOU:   Understand these instructions.  Will watch your condition.  Will get help right away if you are not doing well or get worse. Document Released: 06/18/2005 Document Revised: 04/08/2013 Document Reviewed: 01/15/2013 [facility] Patient Information 2015 [place], [place]. This information is not intended to replace advice given to you by your health care provider. Make sure you discuss any questions you have with your health care provider.

## 2015-03-16 ENCOUNTER — Ambulatory Visit (HOSPITAL_BASED_OUTPATIENT_CLINIC_OR_DEPARTMENT_OTHER): Payer: Medicare Other

## 2015-04-06 ENCOUNTER — Other Ambulatory Visit: Payer: Self-pay | Admitting: *Deleted

## 2015-04-06 ENCOUNTER — Telehealth: Payer: Self-pay | Admitting: Orthopedic Surgery

## 2015-04-06 MED ORDER — HYDROCODONE-ACETAMINOPHEN 7.5-325 MG PO TABS
1.0000 | ORAL_TABLET | Freq: Four times a day (QID) | ORAL | Status: DC | PRN
Start: 2015-04-06 — End: 2015-05-10

## 2015-04-06 NOTE — Telephone Encounter (Signed)
Patient is asking for a refill on medication HYDROcodone-acetaminophen (NORCO) 7.5-325 MG per tablet  He has a follow up appointment with Dr. Aline Brochure on 05/10/15, please advise?

## 2015-04-07 NOTE — Telephone Encounter (Signed)
Patient Picked up Rx 

## 2015-05-10 ENCOUNTER — Ambulatory Visit (INDEPENDENT_AMBULATORY_CARE_PROVIDER_SITE_OTHER): Payer: Medicare Other

## 2015-05-10 ENCOUNTER — Encounter: Payer: Self-pay | Admitting: Orthopedic Surgery

## 2015-05-10 ENCOUNTER — Ambulatory Visit (INDEPENDENT_AMBULATORY_CARE_PROVIDER_SITE_OTHER): Payer: Medicare Other | Admitting: Orthopedic Surgery

## 2015-05-10 VITALS — BP 158/96 | Ht 68.0 in | Wt 275.0 lb

## 2015-05-10 DIAGNOSIS — M129 Arthropathy, unspecified: Secondary | ICD-10-CM

## 2015-05-10 DIAGNOSIS — M1710 Unilateral primary osteoarthritis, unspecified knee: Secondary | ICD-10-CM

## 2015-05-10 DIAGNOSIS — M1712 Unilateral primary osteoarthritis, left knee: Secondary | ICD-10-CM | POA: Diagnosis not present

## 2015-05-10 MED ORDER — HYDROCODONE-ACETAMINOPHEN 7.5-325 MG PO TABS: 1.0000 | ORAL_TABLET | Freq: Four times a day (QID) | ORAL | Status: DC | PRN

## 2015-05-10 MED ORDER — IBUPROFEN 800 MG PO TABS: 800.0000 mg | ORAL_TABLET | Freq: Three times a day (TID) | ORAL | Status: DC

## 2015-05-10 NOTE — Progress Notes (Signed)
Patient ID: Terry Case, male   DOB: Sep 12, 1965, 49 y.o.   MRN: [id number]  Follow up visit  Chief Complaint  Patient presents with  . Follow-up    3 month follow up + xray left knee    BP 158/96 mmHg  Ht 5\' 8"  (1.727 m)  Wt 275 lb (124.739 kg)  BMI 41.82 kg/m2  Encounter Diagnosis  Name Primary?  Marland Kitchen Arthritis of knee Yes    Terry Case comes in for his routine follow-up regarding his left knee arthritis  He is on ibuprofen and hydrocodone 7.5 mg every 6.  His knee is much better after he's lost some weight  He has a sleep study coming up this month for what is a review of system finding of shortness of breath and possible snoring at night  As far as his knee goes he's definitely feeling better  He is walking without support he has a varus knee with medial tenderness no effusion he has flexion ARC of approximately 115 with a flexion contracture. Motor exam quadriceps strong knee stability tests collaterals and cruciates stable. Neurovascular exam is intact  He is complaining of some lower back pain which should be added to his review of systems  His x-rays today I interpret as severe arthritis slight progression despite no increase in symptoms  Recommend 6 month follow-up  Continue ibuprofen and hydrocodone  When he comes back we will x-ray his knee

## 2015-05-16 DIAGNOSIS — E669 Obesity, unspecified: Secondary | ICD-10-CM | POA: Diagnosis not present

## 2015-05-16 DIAGNOSIS — M1710 Unilateral primary osteoarthritis, unspecified knee: Secondary | ICD-10-CM | POA: Diagnosis not present

## 2015-05-20 DIAGNOSIS — N3281 Overactive bladder: Secondary | ICD-10-CM | POA: Diagnosis not present

## 2015-05-22 ENCOUNTER — Ambulatory Visit (HOSPITAL_BASED_OUTPATIENT_CLINIC_OR_DEPARTMENT_OTHER): Payer: Medicare Other | Attending: Family Medicine

## 2015-05-22 VITALS — Ht 68.0 in | Wt 275.0 lb

## 2015-05-22 DIAGNOSIS — G4733 Obstructive sleep apnea (adult) (pediatric): Secondary | ICD-10-CM | POA: Diagnosis not present

## 2015-05-22 DIAGNOSIS — R0683 Snoring: Secondary | ICD-10-CM | POA: Diagnosis not present

## 2015-05-22 DIAGNOSIS — G4710 Hypersomnia, unspecified: Secondary | ICD-10-CM | POA: Insufficient documentation

## 2015-05-28 DIAGNOSIS — G4733 Obstructive sleep apnea (adult) (pediatric): Secondary | ICD-10-CM | POA: Diagnosis not present

## 2015-05-28 NOTE — Progress Notes (Signed)
Patient Name: Terry Case, Terry Case Date: 05/22/2015 Gender: Male D.O.B: August 17, 1965 Age (years): 49 Referring Provider: Iona Beard Height (inches): 68 Interpreting Physician: Baird Lyons MD, ABSM Weight (lbs): 275 RPSGT: Madelon Lips BMI: 42 MRN: [id number] Neck Size: 19.50 CLINICAL INFORMATION Sleep Study Type: Split Night CPAP   Indication for sleep study: Snoring, hypersomnia   Epworth Sleepiness Score: 16 SLEEP STUDY TECHNIQUE As per the AASM Manual for the Scoring of Sleep and Associated Events v2.3 (April 2016) with a hypopnea requiring 4% desaturations. The channels recorded and monitored were frontal, central and occipital EEG, electrooculogram (EOG), submentalis EMG (chin), nasal and oral airflow, thoracic and abdominal wall motion, anterior tibialis EMG, snore microphone, electrocardiogram, and pulse oximetry. Continuous positive airway pressure (CPAP) was initiated when the patient met split night criteria and was titrated according to treat sleep-disordered breathing. MEDICATIONS Medications taken by the patient : charted for review Medications administered by patient during sleep study : No sleep medicine administered.  RESPIRATORY PARAMETERS Diagnostic Total AHI (/hr): 20.1 RDI (/hr): 22.1 OA Index (/hr): 1.5 CA Index (/hr): 0.0 REM AHI (/hr): 62.4 NREM AHI (/hr): 17.4 Supine AHI (/hr): 85.9 Non-supine AHI (/hr): 11.23 Min O2 Sat (%): 67.00 Mean O2 (%): 89.58 Time below 88% (min): 33.0   Titration Optimal Pressure (cm): 12 AHI at Optimal Pressure (/hr): 10.4 Min O2 at Optimal Pressure (%): 87.0 Supine % at Optimal (%): 27 Sleep % at Optimal (%): 96    SLEEP ARCHITECTURE The recording time for the entire night was 398.1 minutes. During a baseline period of 224.2 minutes, the patient slept for 206.1 minutes in REM and nonREM, yielding a sleep efficiency of 91.9%. Sleep onset after lights out was 2.0 minutes with a REM latency of 175.5 minutes. The patient  spent 12.85% of the night in stage N1 sleep, 81.08% in stage N2 sleep, 0.00% in stage N3 and 6.06% in REM. During the titration period of 167.2 minutes, the patient slept for 155.0 minutes in REM and nonREM, yielding a sleep efficiency of 92.7%. Sleep onset after CPAP initiation was 1.7 minutes with a REM latency of 14.5 minutes. The patient spent 6.77% of the night in stage N1 sleep, 71.94% in stage N2 sleep, 0.00% in stage N3 and 21.29% in REM.  CARDIAC DATA The 2 lead EKG demonstrated sinus rhythm. The mean heart rate was 68.21 beats per minute. Other EKG findings include: None.  LEG MOVEMENT DATA The total Periodic Limb Movements of Sleep (PLMS) were 183. The PLMS index was 30.25 .  IMPRESSIONS - Moderate obstructive sleep apnea occurred during the diagnostic portion of the study(AHI = 20.1/hour). An optimal PAP pressure was selected for this patient ( 12 cm of water) - No significant central sleep apnea occurred during the diagnostic portion of the study (CAI = 0.0/hour). - Moderate oxygen desaturation was noted during the diagnostic portion of the study (Min O2 =67.00%). - The patient snored with Moderate snoring volume during the diagnostic portion of the study. - No cardiac abnormalities were noted during this study. - Periodic limb movements of sleep occurred during the study only before CPAP, probably reflecting respiratory arousals.  DIAGNOSIS - Obstructive Sleep Apnea (327.23 [G47.33 ICD-10]) RECOMMENDATIONS - Trial of CPAP therapy on 12 cm H2O with a Medium size Fisher&Paykel Full Face Mask Simplus mask and heated humidification. - Avoid alcohol, sedatives and other CNS depressants that may worsen sleep apnea and disrupt normal sleep architecture. - Sleep hygiene should be reviewed to assess factors that may improve sleep quality. -  Weight management and regular exercise should be initiated or continued.  Deneise Lever Diplomate, American Board of Sleep  Medicine  ELECTRONICALLY SIGNED ON:  05/28/2015, 12:41 PM [place] PH: [phone]   FX: [phone] [place]

## 2015-05-31 DIAGNOSIS — R350 Frequency of micturition: Secondary | ICD-10-CM | POA: Diagnosis not present

## 2015-06-09 ENCOUNTER — Other Ambulatory Visit: Payer: Self-pay | Admitting: *Deleted

## 2015-06-09 ENCOUNTER — Telehealth: Payer: Self-pay | Admitting: *Deleted

## 2015-06-09 MED ORDER — HYDROCODONE-ACETAMINOPHEN 7.5-325 MG PO TABS
1.0000 | ORAL_TABLET | Freq: Four times a day (QID) | ORAL | Status: DC | PRN
Start: 2015-06-09 — End: 2015-07-12

## 2015-06-09 NOTE — Telephone Encounter (Signed)
Prescription available, called patient, no answer 

## 2015-06-09 NOTE — Telephone Encounter (Signed)
Patient came into the office today requesting pain medication, patient states ibuprofen makes him itch. Please advise [phone]

## 2015-06-09 NOTE — Telephone Encounter (Signed)
Routing to Dr Harrison for review 

## 2015-06-09 NOTE — Telephone Encounter (Signed)
Refill his meds if due

## 2015-06-10 NOTE — Telephone Encounter (Signed)
Patient collected his hydrocodone. 06/10/15

## 2015-06-10 NOTE — Telephone Encounter (Signed)
Patient called back, I made him aware his rx is ready, patient states he will pick it up today 06/10/15.

## 2015-06-23 NOTE — Telephone Encounter (Signed)
Phone call to patient concerning OC Light / colonoscopy. Phone rang with no answer / no VM.

## 2015-07-04 IMAGING — CR DG KNEE COMPLETE 4+V*L*
4 series · 4 of 4 positions shown · non-contrast
Comparison: 09/04/2012 and 03/04/2005

CLINICAL DATA: 48-year-old with left knee pain.  No known injury.

EXAM:
LEFT KNEE - COMPLETE 4+ VIEW

[view not recorded (1 of 4)]
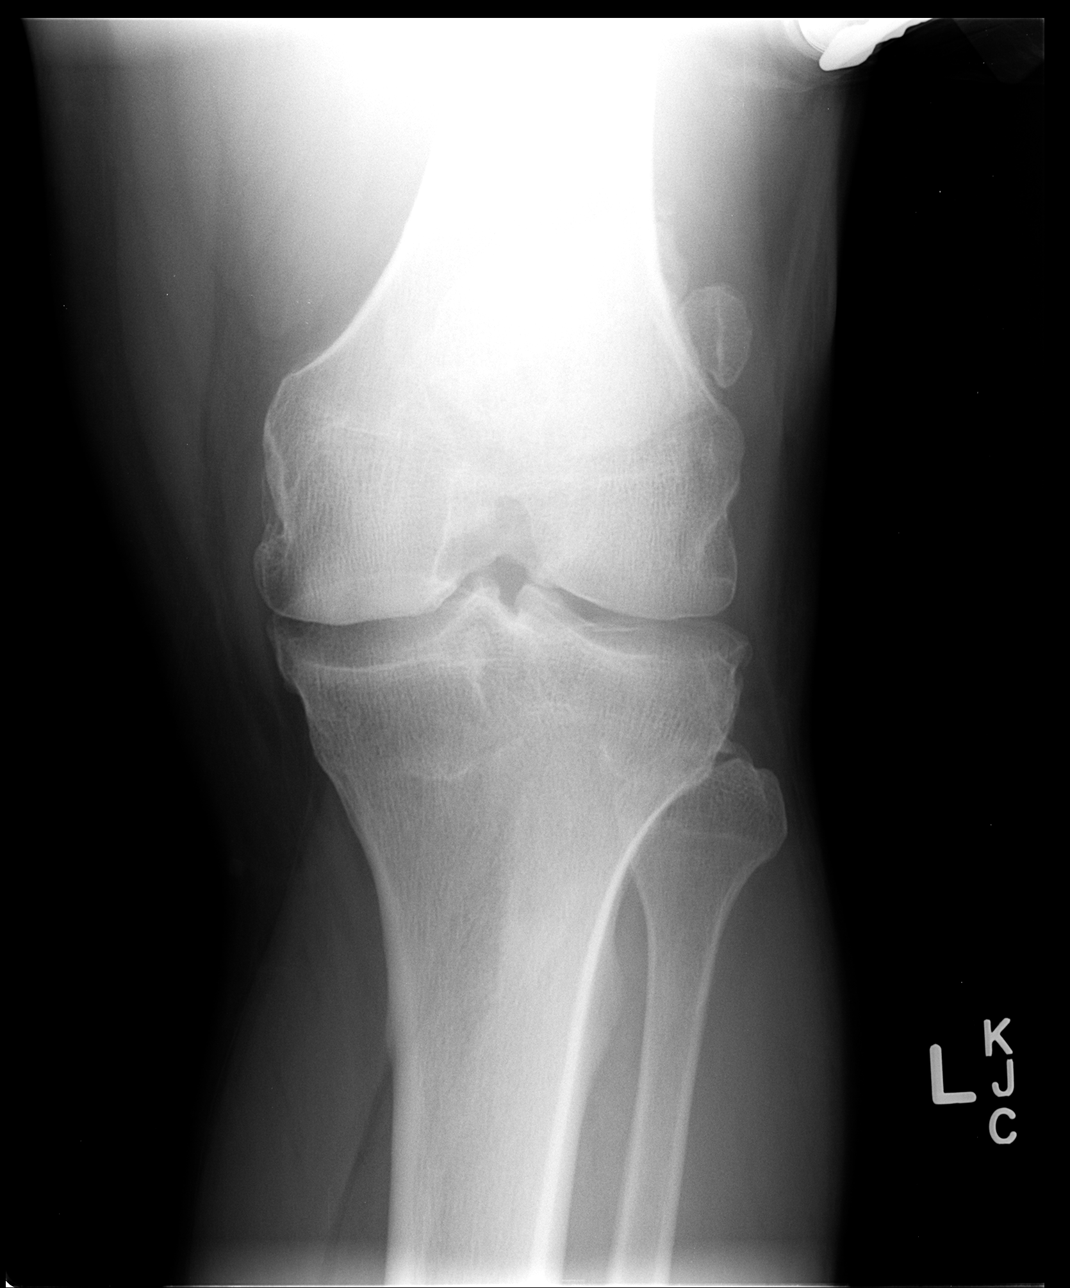

[view not recorded (2 of 4)]
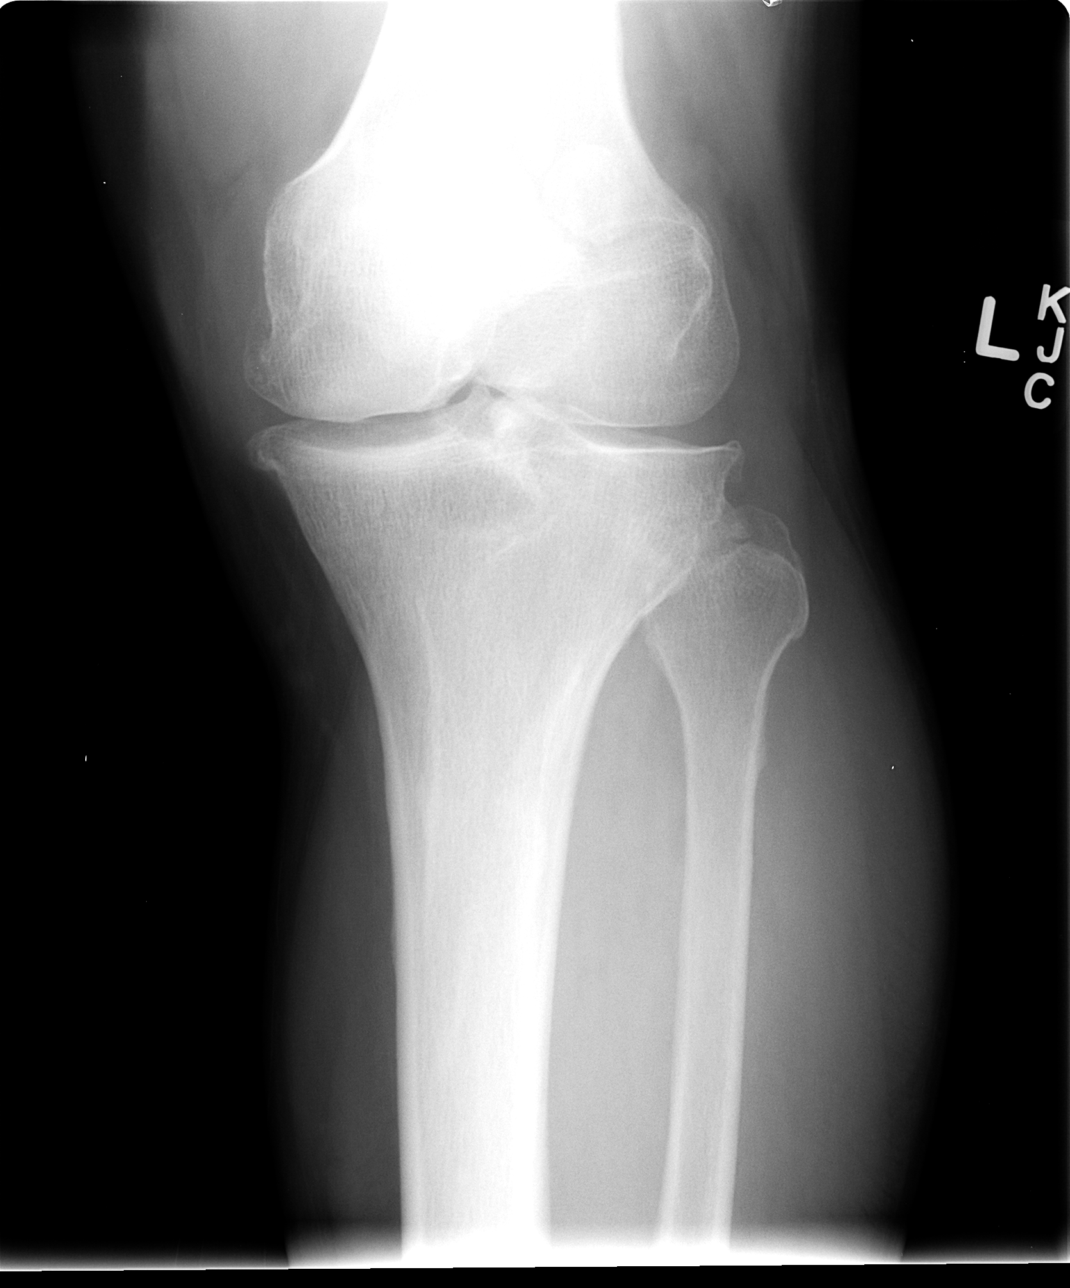

[view not recorded (3 of 4)]
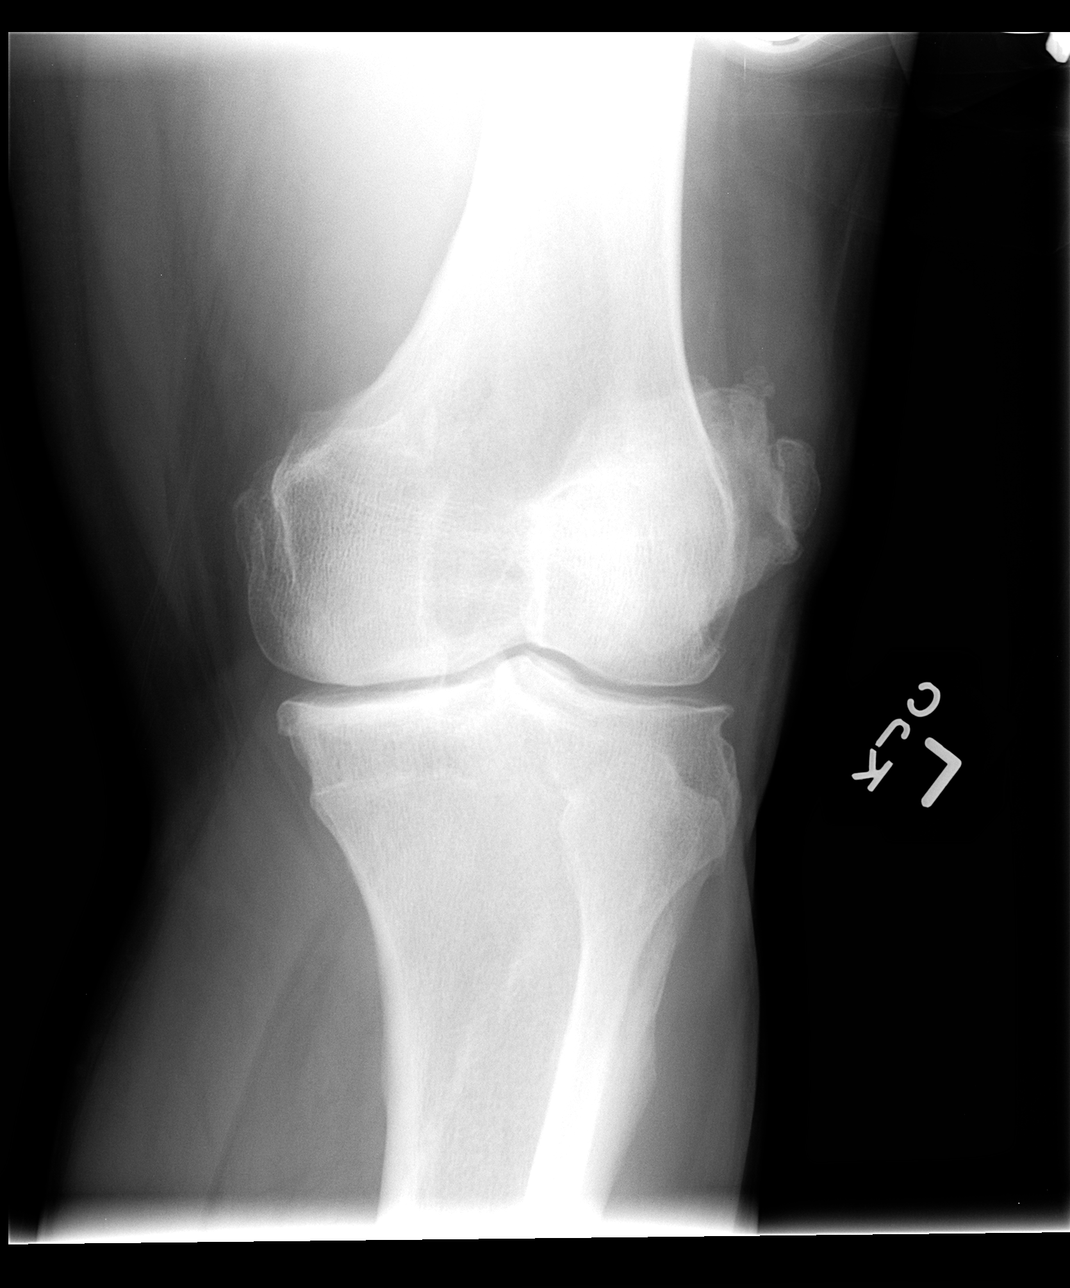

[view not recorded (4 of 4)]
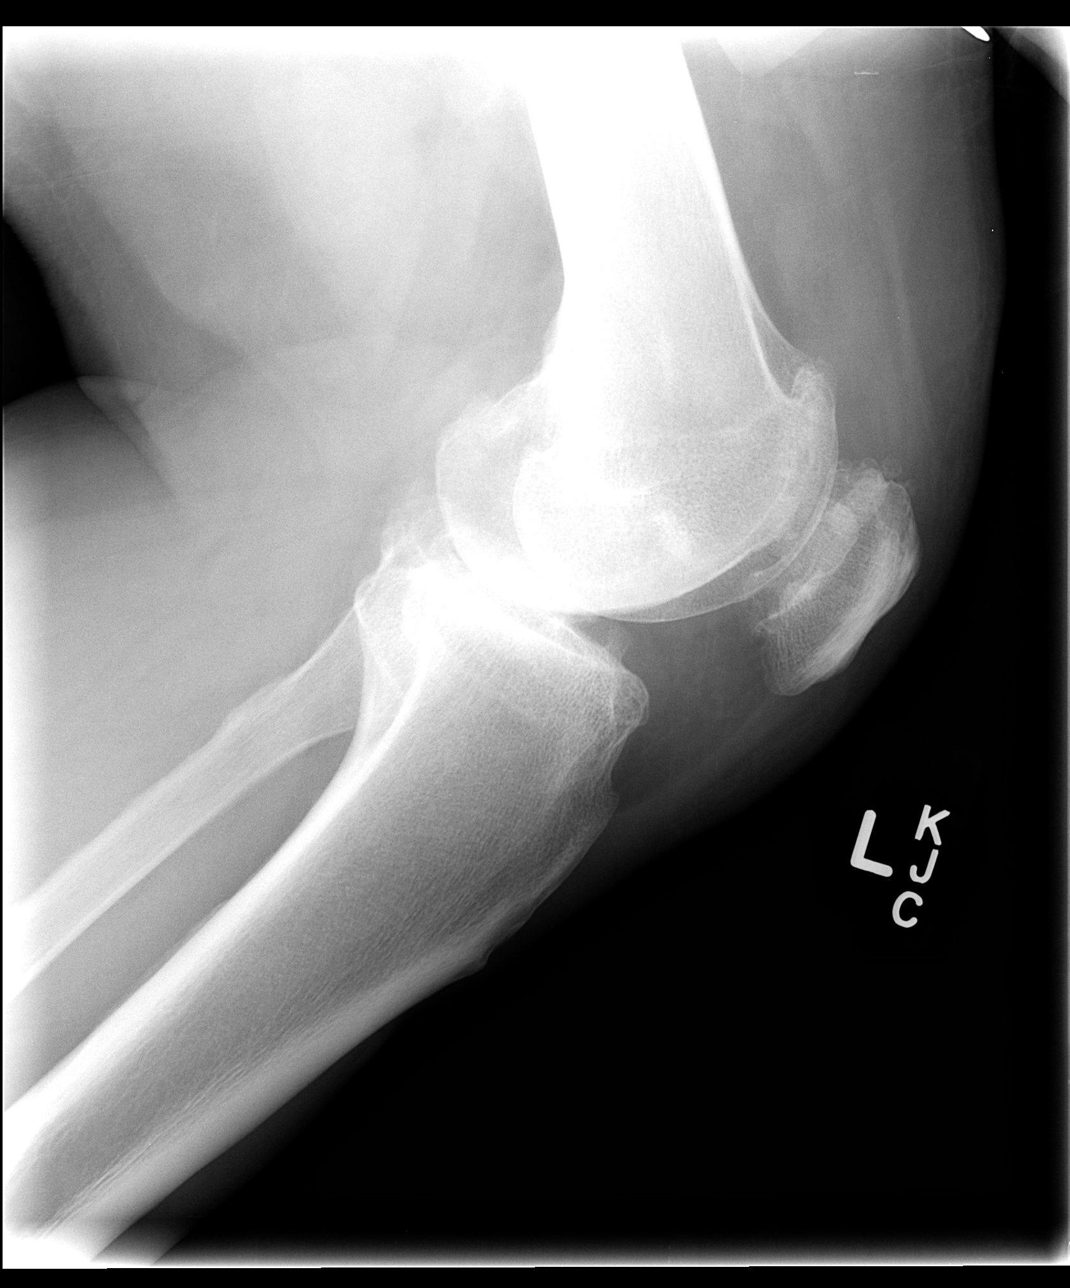

[4 of 4 positions shown; findings below may reference images not displayed]

FINDINGS: Moderate-severe tricompartmental joint space narrowing and
osteophytosis noted with small knee effusion.

There is no evidence of acute fracture, subluxation or dislocation.

No focal bony lesions are identified.

No changes are identified.
IMPRESSION: Moderate to severe tricompartmental degenerative changes with small
joint effusion.

No acute bony abnormality.

## 2015-07-06 ENCOUNTER — Emergency Department (HOSPITAL_COMMUNITY)
Admission: EM | Admit: 2015-07-06 | Discharge: 2015-07-06 | Disposition: A | Discharge status: Home / Self Care | Payer: Medicare Other | Attending: Emergency Medicine | Admitting: Emergency Medicine

## 2015-07-06 ENCOUNTER — Encounter (HOSPITAL_COMMUNITY): Payer: Self-pay | Admitting: *Deleted

## 2015-07-06 ENCOUNTER — Emergency Department (HOSPITAL_COMMUNITY): Payer: Medicare Other

## 2015-07-06 DIAGNOSIS — Z7982 Long term (current) use of aspirin: Secondary | ICD-10-CM | POA: Diagnosis not present

## 2015-07-06 DIAGNOSIS — Z79899 Other long term (current) drug therapy: Secondary | ICD-10-CM | POA: Diagnosis not present

## 2015-07-06 DIAGNOSIS — M1990 Unspecified osteoarthritis, unspecified site: Secondary | ICD-10-CM | POA: Insufficient documentation

## 2015-07-06 DIAGNOSIS — Z872 Personal history of diseases of the skin and subcutaneous tissue: Secondary | ICD-10-CM | POA: Diagnosis not present

## 2015-07-06 DIAGNOSIS — J209 Acute bronchitis, unspecified: Secondary | ICD-10-CM | POA: Diagnosis not present

## 2015-07-06 DIAGNOSIS — R05 Cough: Secondary | ICD-10-CM | POA: Diagnosis not present

## 2015-07-06 DIAGNOSIS — K219 Gastro-esophageal reflux disease without esophagitis: Secondary | ICD-10-CM | POA: Insufficient documentation

## 2015-07-06 DIAGNOSIS — N451 Epididymitis: Secondary | ICD-10-CM | POA: Diagnosis not present

## 2015-07-06 DIAGNOSIS — J45909 Unspecified asthma, uncomplicated: Secondary | ICD-10-CM | POA: Diagnosis not present

## 2015-07-06 DIAGNOSIS — R61 Generalized hyperhidrosis: Secondary | ICD-10-CM | POA: Insufficient documentation

## 2015-07-06 DIAGNOSIS — Z791 Long term (current) use of non-steroidal anti-inflammatories (NSAID): Secondary | ICD-10-CM | POA: Insufficient documentation

## 2015-07-06 DIAGNOSIS — Z87891 Personal history of nicotine dependence: Secondary | ICD-10-CM | POA: Diagnosis not present

## 2015-07-06 DIAGNOSIS — Z8701 Personal history of pneumonia (recurrent): Secondary | ICD-10-CM | POA: Insufficient documentation

## 2015-07-06 DIAGNOSIS — G8929 Other chronic pain: Secondary | ICD-10-CM | POA: Diagnosis not present

## 2015-07-06 LAB — URINALYSIS, ROUTINE W REFLEX MICROSCOPIC
BILIRUBIN URINE: NEGATIVE
Glucose, UA: NEGATIVE mg/dL
Hgb urine dipstick: NEGATIVE
KETONES UR: NEGATIVE mg/dL
LEUKOCYTES UA: NEGATIVE
NITRITE: NEGATIVE
PH: 8 (ref 5.0–8.0)
Protein, ur: NEGATIVE mg/dL
SPECIFIC GRAVITY, URINE: 1.015 (ref 1.005–1.030)

## 2015-07-06 MED ORDER — DOXYCYCLINE HYCLATE 100 MG PO CAPS: ORAL_CAPSULE | ORAL | Status: DC

## 2015-07-06 NOTE — ED Notes (Signed)
Patient unable to provide urine specimen at this time.

## 2015-07-06 NOTE — ED Notes (Signed)
Pt comes in with cough starting around x1 month ago with productive sputum. Also, pt states he is having increased urination and testicular pain starting around 1 month ago. Pt states he went to the doctor for this and was given a medication (he doesn't know what).

## 2015-07-06 NOTE — ED Notes (Signed)
Non-productive cough for 3 weeks.  Urinary frequency and testicle pain started this am.  Tender to right scrotum.
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
## 2015-07-06 NOTE — Discharge Instructions (Signed)
Doxycycline as prescribed.  Continue over-the-counter medications as needed for symptomatic relief.   Epididymitis Epididymitis is swelling (inflammation) of the epididymis. The epididymis is a cord-like structure that is located along the top and back part of the testicle. It collects and stores sperm from the testicle. This condition can also cause pain and swelling of the testicle and scrotum. Symptoms usually start suddenly (acute epididymitis). Sometimes epididymitis starts gradually and lasts for a while (chronic epididymitis). This type may be harder to treat. CAUSES In men 51 and younger, this condition is usually caused by a bacterial infection or sexually transmitted disease (STD), such as:  Gonorrhea.  Chlamydia.  In men 51 and older who do not have anal sex, this condition is usually caused by bacteria from a blockage or abnormalities in the urinary system. These can result from:  Having a tube placed into the bladder (urinary catheter).  Having an enlarged or inflamed prostate gland.  Having recent urinary tract surgery. In men who have a condition that weakens the body's defense system (immune system), such as HIV, this condition can be caused by:   Other bacteria, including tuberculosis and syphilis.  Viruses.  Fungi. Sometimes this condition occurs without infection. That may happen if urine flows backward into the epididymis after heavy lifting or straining. RISK FACTORS This condition is more likely to develop in men:  Who have unprotected sex with more than one partner.  Who have anal sex.   Who have recently had surgery.   Who have a urinary catheter.  Who have urinary problems.  Who have a suppressed immune system. SYMPTOMS  This condition usually begins suddenly with chills, fever, and pain behind the scrotum and in the testicle. Other symptoms include:   Swelling of the scrotum, testicle, or both.  Pain whenejaculatingor urinating.  Pain  in the back or belly.  Nausea.  Itching and discharge from the penis.  Frequent need to pass urine.  Redness and tenderness of the scrotum. DIAGNOSIS Your health care provider can diagnose this condition based on your symptoms and medical history. Your health care provider will also do a physical exam to ask about your symptoms and check your scrotum and testicle for swelling, pain, and redness. You may also have other tests, including:   Examination of discharge from the penis.  Urine tests for infections, such as STDs.  Your health care provider may test you for other STDs, including HIV. TREATMENT Treatment for this condition depends on the cause. If your condition is caused by a bacterial infection, oral antibiotic medicine may be prescribed. If the bacterial infection has spread to your blood, you may need to receive IV antibiotics. Nonbacterial epididymitis is treated with home care that includes bed rest and elevation of the scrotum. Surgery may be needed to treat:  Bacterial epididymitis that causes pus to build up in the scrotum (abscess).  Chronic epididymitis that has not responded to other treatments. HOME CARE INSTRUCTIONS Medicines  Take over-the-counter and prescription medicines only as told by your health care provider.   If you were prescribed an antibiotic medicine, take it as told by your health care provider. Do not stop taking the antibiotic even if your condition improves. Sexual Activity  If your epididymitis was caused by an STD, avoid sexual activity until your treatment is complete.  Inform your sexual partner or partners if you test positive for an STD. They may need to be treated.Do not engage in sexual activity with your partner or partners until  their treatment is completed. General Instructions  Return to your normal activities as told by your health care provider. Ask your health care provider what activities are safe for you.  Keep your  scrotum elevated and supported while resting. Ask your health care provider if you should wear a scrotal support, such as a jockstrap. Wear it as told by your health care provider.  If directed, apply ice to the affected area:   Put ice in a plastic bag.  Place a towel between your skin and the bag.  Leave the ice on for 20 minutes, 2-3 times per day.  Try taking a sitz bath to help with discomfort. This is a warm water bath that is taken while you are sitting down. The water should only come up to your hips and should cover your buttocks. Do this 3-4 times per day or as told by your health care provider.  Keep all follow-up visits as told by your health care provider. This is important. SEEK MEDICAL CARE IF:   You have a fever.   Your pain medicine is not helping.   Your pain is getting worse.   Your symptoms do not improve within three days.   This information is not intended to replace advice given to you by your health care provider. Make sure you discuss any questions you have with your health care provider.   Document Released: 06/15/2000 Document Revised: 03/09/2015 Document Reviewed: 11/03/2014 Elsevier Interactive Patient Education 2016 [employer]  Acute Bronchitis Bronchitis is inflammation of the airways that extend from the windpipe into the lungs (bronchi). The inflammation often causes mucus to develop. This leads to a cough, which is the most common symptom of bronchitis.  In acute bronchitis, the condition usually develops suddenly and goes away over time, usually in a couple weeks. Smoking, allergies, and asthma can make bronchitis worse. Repeated episodes of bronchitis may cause further lung problems.  CAUSES Acute bronchitis is most often caused by the same virus that causes a cold. The virus can spread from person to person (contagious) through coughing, sneezing, and touching contaminated objects. SIGNS AND SYMPTOMS   Cough.   Fever.   Coughing up  mucus.   Body aches.   Chest congestion.   Chills.   Shortness of breath.   Sore throat.  DIAGNOSIS  Acute bronchitis is usually diagnosed through a physical exam. Your health care provider will also ask you questions about your medical history. Tests, such as chest X-rays, are sometimes done to rule out other conditions.  TREATMENT  Acute bronchitis usually goes away in a couple weeks. Oftentimes, no medical treatment is necessary. Medicines are sometimes given for relief of fever or cough. Antibiotic medicines are usually not needed but may be prescribed in certain situations. In some cases, an inhaler may be recommended to help reduce shortness of breath and control the cough. A cool mist vaporizer may also be used to help thin bronchial secretions and make it easier to clear the chest.  HOME CARE INSTRUCTIONS  Get plenty of rest.   Drink enough fluids to keep your urine clear or pale yellow (unless you have a medical condition that requires fluid restriction). Increasing fluids may help thin your respiratory secretions (sputum) and reduce chest congestion, and it will prevent dehydration.   Take medicines only as directed by your health care provider.  If you were prescribed an antibiotic medicine, finish it all even if you start to feel better.  Avoid smoking and secondhand smoke.  Exposure to cigarette smoke or irritating chemicals will make bronchitis worse. If you are a smoker, consider using nicotine gum or skin patches to help control withdrawal symptoms. Quitting smoking will help your lungs heal faster.   Reduce the chances of another bout of acute bronchitis by washing your hands frequently, avoiding people with cold symptoms, and trying not to touch your hands to your mouth, nose, or eyes.   Keep all follow-up visits as directed by your health care provider.  SEEK MEDICAL CARE IF: Your symptoms do not improve after 1 week of treatment.  SEEK IMMEDIATE MEDICAL  CARE IF:  You develop an increased fever or chills.   You have chest pain.   You have severe shortness of breath.  You have bloody sputum.   You develop dehydration.  You faint or repeatedly feel like you are going to pass out.  You develop repeated vomiting.  You develop a severe headache. MAKE SURE YOU:   Understand these instructions.  Will watch your condition.  Will get help right away if you are not doing well or get worse.   This information is not intended to replace advice given to you by your health care provider. Make sure you discuss any questions you have with your health care provider.   Document Released: 07/26/2004 Document Revised: 07/09/2014 Document Reviewed: 12/09/2012 Elsevier Interactive Patient Education [employer].

## 2015-07-09 ENCOUNTER — Emergency Department (HOSPITAL_COMMUNITY): Payer: Medicare Other

## 2015-07-09 ENCOUNTER — Encounter (HOSPITAL_COMMUNITY): Payer: Self-pay | Admitting: *Deleted

## 2015-07-09 ENCOUNTER — Emergency Department (HOSPITAL_COMMUNITY)
Admission: EM | Admit: 2015-07-09 | Discharge: 2015-07-09 | Disposition: A | Discharge status: Home / Self Care | Payer: Medicare Other | Attending: Emergency Medicine | Admitting: Emergency Medicine

## 2015-07-09 DIAGNOSIS — Z791 Long term (current) use of non-steroidal anti-inflammatories (NSAID): Secondary | ICD-10-CM | POA: Insufficient documentation

## 2015-07-09 DIAGNOSIS — Z79899 Other long term (current) drug therapy: Secondary | ICD-10-CM | POA: Insufficient documentation

## 2015-07-09 DIAGNOSIS — Z7982 Long term (current) use of aspirin: Secondary | ICD-10-CM | POA: Insufficient documentation

## 2015-07-09 DIAGNOSIS — Z87891 Personal history of nicotine dependence: Secondary | ICD-10-CM | POA: Diagnosis not present

## 2015-07-09 DIAGNOSIS — R12 Heartburn: Secondary | ICD-10-CM | POA: Diagnosis not present

## 2015-07-09 DIAGNOSIS — Z8701 Personal history of pneumonia (recurrent): Secondary | ICD-10-CM | POA: Diagnosis not present

## 2015-07-09 DIAGNOSIS — R079 Chest pain, unspecified: Secondary | ICD-10-CM

## 2015-07-09 DIAGNOSIS — J45909 Unspecified asthma, uncomplicated: Secondary | ICD-10-CM | POA: Diagnosis not present

## 2015-07-09 DIAGNOSIS — Z872 Personal history of diseases of the skin and subcutaneous tissue: Secondary | ICD-10-CM | POA: Diagnosis not present

## 2015-07-09 DIAGNOSIS — M1990 Unspecified osteoarthritis, unspecified site: Secondary | ICD-10-CM | POA: Insufficient documentation

## 2015-07-09 DIAGNOSIS — G8929 Other chronic pain: Secondary | ICD-10-CM | POA: Diagnosis not present

## 2015-07-09 DIAGNOSIS — K219 Gastro-esophageal reflux disease without esophagitis: Secondary | ICD-10-CM | POA: Insufficient documentation

## 2015-07-09 LAB — COMPREHENSIVE METABOLIC PANEL
ALBUMIN: 4.2 g/dL (ref 3.5–5.0)
ALT: 43 U/L (ref 17–63)
AST: 31 U/L (ref 15–41)
Alkaline Phosphatase: 69 U/L (ref 38–126)
Anion gap: 9 (ref 5–15)
BUN: 21 mg/dL — AB (ref 6–20)
CHLORIDE: 104 mmol/L (ref 101–111)
CO2: 25 mmol/L (ref 22–32)
Calcium: 9.1 mg/dL (ref 8.9–10.3)
Creatinine, Ser: 1.15 mg/dL (ref 0.61–1.24)
GFR calc Af Amer: >60 mL/min (ref >60)
GFR calc non Af Amer: >60 mL/min (ref >60)
GLUCOSE: 104 mg/dL — AB (ref 65–99)
POTASSIUM: 4 mmol/L (ref 3.5–5.1)
Sodium: 138 mmol/L (ref 135–145)
Total Bilirubin: 0.6 mg/dL (ref 0.3–1.2)
Total Protein: 7.5 g/dL (ref 6.5–8.1)

## 2015-07-09 LAB — CBC WITH DIFFERENTIAL/PLATELET
Basophils Absolute: 0 10*3/uL (ref 0.0–0.1)
Basophils Relative: 1 %
Eosinophils Absolute: 0.2 10*3/uL (ref 0.0–0.7)
Eosinophils Relative: 5 %
HCT: 41.4 % (ref 39.0–52.0)
Hemoglobin: 13.6 g/dL (ref 13.0–17.0)
Lymphocytes Relative: 39 %
Lymphs Abs: 1.7 10*3/uL (ref 0.7–4.0)
MCH: 27.8 pg (ref 26.0–34.0)
MCHC: 32.9 g/dL (ref 30.0–36.0)
MCV: 84.5 fL (ref 78.0–100.0)
Monocytes Absolute: 0.3 10*3/uL (ref 0.1–1.0)
Monocytes Relative: 6 %
Neutro Abs: 2.2 10*3/uL (ref 1.7–7.7)
Neutrophils Relative %: 49 %
Platelets: 265 10*3/uL (ref 150–400)
RBC: 4.9 MIL/uL (ref 4.22–5.81)
RDW: 13.9 % (ref 11.5–15.5)
WBC: 4.4 10*3/uL (ref 4.0–10.5)

## 2015-07-09 LAB — LIPASE, BLOOD: LIPASE: 39 U/L (ref 11–51)

## 2015-07-09 LAB — TROPONIN I
Troponin I: <0.03 ng/mL (ref <0.031)
Troponin I: <0.03 ng/mL (ref <0.031)

## 2015-07-09 MED ORDER — GI COCKTAIL ~~LOC~~
30.0000 mL | Freq: Once | ORAL | Status: AC
  Administered 2015-07-09: 30 mL via ORAL
  Filled 2015-07-09: qty 30

## 2015-07-09 MED ORDER — RANITIDINE HCL 150 MG/10ML PO SYRP
150.0000 mg | ORAL_SOLUTION | Freq: Once | ORAL | Status: AC
  Administered 2015-07-09: 150 mg via ORAL
  Filled 2015-07-09: qty 10

## 2015-07-09 MED ORDER — RANITIDINE HCL 150 MG/10ML PO SYRP
ORAL_SOLUTION | ORAL | Status: AC
  Filled 2015-07-09: qty 10

## 2015-07-09 MED ORDER — PANTOPRAZOLE SODIUM 20 MG PO TBEC: 20.0000 mg | DELAYED_RELEASE_TABLET | Freq: Two times a day (BID) | ORAL | Status: DC

## 2015-07-09 NOTE — ED Notes (Signed)
Pt alert & oriented x4, stable gait. Patient given discharge instructions, paperwork & prescription(s). Patient  instructed to stop at the registration desk to finish any additional paperwork. Patient verbalized understanding. Pt left department w/ no further questions. 

## 2015-07-09 NOTE — ED Notes (Signed)
Pt comes in with chest pain starting 2 days ago. Pt states the pain is intermittent but may come when he is sitting down. NAD noted upon triage. Pt was recently seen for bronchitis and states his cough is much better.

## 2015-07-09 NOTE — ED Notes (Signed)
Waiting for Zantac to come from pharmacy

## 2015-07-09 NOTE — ED Notes (Signed)
MD at bedside. 
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
## 2015-07-12 ENCOUNTER — Other Ambulatory Visit: Payer: Self-pay | Admitting: *Deleted

## 2015-07-12 ENCOUNTER — Telehealth: Payer: Self-pay | Admitting: *Deleted

## 2015-07-12 MED ORDER — HYDROCODONE-ACETAMINOPHEN 7.5-325 MG PO TABS: 1.0000 | ORAL_TABLET | Freq: Four times a day (QID) | ORAL | Status: DC | PRN

## 2015-07-12 NOTE — Telephone Encounter (Signed)
Prescription available, patient aware  

## 2015-07-12 NOTE — Telephone Encounter (Signed)
Patient collected RX

## 2015-07-12 NOTE — Telephone Encounter (Signed)
Patient came into the office today requesting his hydrocodone to be refilled. Please advise

## 2015-07-19 DIAGNOSIS — K219 Gastro-esophageal reflux disease without esophagitis: Secondary | ICD-10-CM | POA: Diagnosis not present

## 2015-07-23 DIAGNOSIS — G4733 Obstructive sleep apnea (adult) (pediatric): Secondary | ICD-10-CM | POA: Diagnosis not present

## 2015-08-10 ENCOUNTER — Other Ambulatory Visit: Payer: Self-pay | Admitting: Orthopedic Surgery

## 2015-08-10 ENCOUNTER — Other Ambulatory Visit: Payer: Self-pay | Admitting: *Deleted

## 2015-08-10 MED ORDER — HYDROCODONE-ACETAMINOPHEN 7.5-325 MG PO TABS: 1.0000 | ORAL_TABLET | Freq: Four times a day (QID) | ORAL | Status: DC | PRN

## 2015-08-10 NOTE — Telephone Encounter (Signed)
PATIENT AWARE

## 2015-08-10 NOTE — Telephone Encounter (Signed)
Hydrocodone 7.5-325 mgs.

## 2015-08-10 NOTE — Telephone Encounter (Signed)
Prescription available, called patient, no answer 

## 2015-08-15 DIAGNOSIS — H6123 Impacted cerumen, bilateral: Secondary | ICD-10-CM | POA: Diagnosis not present

## 2015-08-23 DIAGNOSIS — G4733 Obstructive sleep apnea (adult) (pediatric): Secondary | ICD-10-CM | POA: Diagnosis not present

## 2015-09-07 ENCOUNTER — Other Ambulatory Visit: Payer: Self-pay | Admitting: *Deleted

## 2015-09-07 ENCOUNTER — Telehealth: Payer: Self-pay | Admitting: Orthopedic Surgery

## 2015-09-07 MED ORDER — HYDROCODONE-ACETAMINOPHEN 7.5-325 MG PO TABS: 1.0000 | ORAL_TABLET | Freq: Four times a day (QID) | ORAL | Status: DC | PRN

## 2015-09-07 NOTE — Telephone Encounter (Signed)
Patient called to request refill on medication: HYDROcodone-acetaminophen ([place]) 7.5-325 MG tablet [id number] - Ph [phone]

## 2015-09-08 ENCOUNTER — Other Ambulatory Visit: Payer: Self-pay | Admitting: *Deleted

## 2015-09-08 MED ORDER — HYDROCODONE-ACETAMINOPHEN 7.5-325 MG PO TABS: 1.0000 | ORAL_TABLET | Freq: Four times a day (QID) | ORAL | Status: DC | PRN

## 2015-09-12 ENCOUNTER — Other Ambulatory Visit: Payer: Self-pay | Admitting: *Deleted

## 2015-09-12 MED ORDER — HYDROCODONE-ACETAMINOPHEN 7.5-325 MG PO TABS: 1.0000 | ORAL_TABLET | Freq: Four times a day (QID) | ORAL | Status: DC | PRN

## 2015-09-12 NOTE — Telephone Encounter (Signed)
Patient aware prescription available for pick up 

## 2015-10-04 NOTE — Telephone Encounter (Signed)
Phone call to patient concerning [facility]. Scheduled appointment.

## 2015-10-06 ENCOUNTER — Encounter: Attending: Family Medicine | Primary: Internal Medicine

## 2015-11-07 ENCOUNTER — Telehealth: Payer: Self-pay | Admitting: Orthopedic Surgery

## 2015-11-07 NOTE — Telephone Encounter (Signed)
Patient called to request refill on:    HYDROcodone-acetaminophen (NORCO) 7.5-325 MG tablet [id number] - quantity 120.

## 2015-11-07 NOTE — Telephone Encounter (Signed)
NOT DUE UNTIL LATER THIS WEEK

## 2015-11-08 ENCOUNTER — Ambulatory Visit: Payer: Medicare Other | Admitting: Orthopedic Surgery

## 2015-11-08 ENCOUNTER — Encounter: Payer: Self-pay | Admitting: Orthopedic Surgery

## 2015-11-08 NOTE — Telephone Encounter (Signed)
PATIENT JUST PICKED UP PRESCRIPTION FROM LAST MONTH

## 2015-11-15 ENCOUNTER — Ambulatory Visit: Payer: Medicare Other | Admitting: Orthopedic Surgery

## 2015-11-16 ENCOUNTER — Encounter: Payer: Self-pay | Admitting: Orthopedic Surgery

## 2015-11-16 ENCOUNTER — Ambulatory Visit (INDEPENDENT_AMBULATORY_CARE_PROVIDER_SITE_OTHER): Payer: Medicare Other | Admitting: Orthopedic Surgery

## 2015-11-16 ENCOUNTER — Ambulatory Visit (INDEPENDENT_AMBULATORY_CARE_PROVIDER_SITE_OTHER): Payer: Medicare Other

## 2015-11-16 VITALS — BP 133/80 | Ht 68.0 in | Wt 270.0 lb

## 2015-11-16 DIAGNOSIS — M1712 Unilateral primary osteoarthritis, left knee: Secondary | ICD-10-CM | POA: Diagnosis not present

## 2015-11-16 DIAGNOSIS — B353 Tinea pedis: Secondary | ICD-10-CM | POA: Diagnosis not present

## 2015-11-16 DIAGNOSIS — Z79899 Other long term (current) drug therapy: Secondary | ICD-10-CM | POA: Diagnosis not present

## 2015-11-16 MED ORDER — DIPHENHYDRAMINE HCL 25 MG PO TABS: 25.0000 mg | ORAL_TABLET | Freq: Four times a day (QID) | ORAL | Status: DC | PRN

## 2015-11-16 MED ORDER — HYDROCODONE-ACETAMINOPHEN 7.5-325 MG PO TABS: 1.0000 | ORAL_TABLET | Freq: Four times a day (QID) | ORAL | Status: DC | PRN

## 2015-11-16 MED ORDER — IBUPROFEN 800 MG PO TABS: 800.0000 mg | ORAL_TABLET | Freq: Three times a day (TID) | ORAL | Status: DC

## 2015-11-16 NOTE — Progress Notes (Signed)
Patient ID: Terry Case, male   DOB: Jul 13, 1965, 50 y.o.   MRN: [id number]  Left knee pain follow-up  HPI 50 years old followed for osteoarthritis left knee currently doing well with ibuprofen and Norco 7.5 mg every 6 hours. He gets good pain relief from this combination of medications. His activities daily living are performed without any difficulties.  Review of Systems  Constitutional: Negative for fever and chills.  Respiratory: Negative for shortness of breath.   Cardiovascular: Negative for chest pain.    Past Medical History  Diagnosis Date  . Chronic back pain   . Acid reflux   . Arthritis   . Eczema   . Asthma   . Pneumonia     BP 133/80 mmHg  Ht 5\' 8"  (1.727 m)  Wt 270 lb (122.471 kg)  BMI 41.06 kg/m2  Physical Exam Physical Exam  Constitutional: The patient appears well-developed and well-nourished. No distress.  The patient is oriented to person, place, and time.  Psychiatric: The patient has a normal mood and affect.  Cardiovascular: Intact distal pulses.   Neurological: sensation is normal  Skin: Skin is warm and dry. No rash noted. The patient is not diaphoretic. No erythema. No pallor.    Right Knee Exam   Tenderness  The patient is experiencing no tenderness.     Range of Motion  Extension: 5  Flexion: 120   Muscle Strength   The patient has normal right knee strength.  Tests  Drawer:       Anterior - negative    Posterior - negative Varus: negative Valgus: negative  Other  Erythema: absent Scars: absent Sensation: normal Pulse: present Swelling: none   Left Knee Exam   Tenderness  The patient is experiencing tenderness in the medial joint line.  Range of Motion  Extension: 5  Flexion: 110   Muscle Strength   The patient has normal left knee strength.  Tests  Drawer:       Anterior - negative     Posterior - negative Varus: negative Valgus: negative  Other  Erythema: absent Scars: absent Sensation:  normal Pulse: present Swelling: none      He is ambulatory with no assistive devices and no limping.    ASSESSMENT AND PLAN   Today's x-rays compared to 05/10/2015. Similar findings noted with joint space narrowing medial lateral compartments medial worse than lateral and multiple osteophytes around the joint with joint space narrowing in the lateral compartment sclerosis of the medial tibial plateau.

## 2015-11-16 NOTE — Addendum Note (Signed)
Addended by: Baldomero Lamy B on: 11/16/2015 03:00 PM   Modules accepted: Orders, Medications

## 2016-01-05 ENCOUNTER — Other Ambulatory Visit: Payer: Self-pay | Admitting: Orthopedic Surgery

## 2016-01-05 ENCOUNTER — Telehealth: Payer: Self-pay | Admitting: Orthopedic Surgery

## 2016-01-05 ENCOUNTER — Other Ambulatory Visit: Payer: Self-pay | Admitting: *Deleted

## 2016-01-05 MED ORDER — HYDROCODONE-ACETAMINOPHEN 7.5-325 MG PO TABS: 1.0000 | ORAL_TABLET | Freq: Four times a day (QID) | ORAL | Status: DC | PRN

## 2016-01-05 NOTE — Telephone Encounter (Signed)
Patient called and requested a refill on Hydrocodone-Acetaminophen (Norco) 7.5-325 mgs.  Qty  120    Sig: Take 1 tablet by mouth every 6 (six) hours as needed (pain).

## 2016-01-05 NOTE — Telephone Encounter (Signed)
Routing to Dr. Harrison to advise 

## 2016-01-10 ENCOUNTER — Telehealth: Payer: Self-pay | Admitting: Orthopaedic Surgery

## 2016-01-10 MED ORDER — HYDROCODONE-ACETAMINOPHEN 7.5-325 MG PO TABS: 1.0000 | ORAL_TABLET | Freq: Four times a day (QID) | ORAL | Status: DC | PRN

## 2016-01-10 NOTE — Telephone Encounter (Signed)
Patient of Dr. Zacarias Pontes said he was told last week to call this in this week.  Hydrocodone- Acetaminophen 7.5/325mg   Qty 28 Tablets  Take 1 by mouth every 6(six) hours as needed for moderate pain.

## 2016-01-10 NOTE — Telephone Encounter (Signed)
Rx done. 

## 2016-01-17 ENCOUNTER — Other Ambulatory Visit: Payer: Self-pay | Admitting: *Deleted

## 2016-01-17 ENCOUNTER — Telehealth: Payer: Self-pay | Admitting: Orthopedic Surgery

## 2016-01-17 DIAGNOSIS — M25562 Pain in left knee: Secondary | ICD-10-CM | POA: Diagnosis not present

## 2016-01-17 DIAGNOSIS — H43391 Other vitreous opacities, right eye: Secondary | ICD-10-CM | POA: Diagnosis not present

## 2016-01-17 DIAGNOSIS — M159 Polyosteoarthritis, unspecified: Secondary | ICD-10-CM | POA: Diagnosis not present

## 2016-01-17 DIAGNOSIS — B353 Tinea pedis: Secondary | ICD-10-CM | POA: Diagnosis not present

## 2016-01-17 MED ORDER — HYDROCODONE-ACETAMINOPHEN 7.5-325 MG PO TABS: 1.0000 | ORAL_TABLET | Freq: Four times a day (QID) | ORAL | Status: DC | PRN

## 2016-01-17 NOTE — Telephone Encounter (Signed)
Patient called to request refill on pain medication: HYDROcodone-acetaminophen (NORCO) 7.5-325 MG tablet - states has been receiving quantity of 28.  Please advise.   (Patient aware of next appointment in November for follow up visit.)

## 2016-01-18 ENCOUNTER — Other Ambulatory Visit: Payer: Self-pay | Admitting: *Deleted

## 2016-01-18 MED ORDER — HYDROCODONE-ACETAMINOPHEN 7.5-325 MG PO TABS: 1.0000 | ORAL_TABLET | Freq: Four times a day (QID) | ORAL | Status: DC | PRN

## 2016-01-18 NOTE — Telephone Encounter (Signed)
Prescription available 

## 2016-01-22 ENCOUNTER — Inpatient Hospital Stay
Admit: 2016-01-22 | Discharge: 2016-01-22 | Disposition: A | Discharge status: Home / Self Care | Payer: PRIVATE HEALTH INSURANCE | Attending: Emergency Medicine

## 2016-01-22 DIAGNOSIS — L237 Allergic contact dermatitis due to plants, except food: Secondary | ICD-10-CM

## 2016-01-22 MED ORDER — PREDNISONE 20 MG PO TABS
20 MG | Freq: Every day | ORAL | Status: DC
Start: 2016-01-22 — End: 2016-01-22
  Administered 2016-01-22: 40 mg via ORAL

## 2016-01-22 MED ORDER — PREDNISONE 20 MG PO TABS
20 MG | ORAL_TABLET | ORAL | 0 refills | Status: DC
Start: 2016-01-22 — End: 2016-02-14

## 2016-01-22 MED FILL — PREDNISONE 20 MG PO TABS: 20 MG | ORAL | Qty: 2

## 2016-01-22 NOTE — ED Provider Notes (Signed)
eMERGENCY dEPARTMENT eNCOUnter        CHIEF COMPLAINT    Chief Complaint   Patient presents with   ??? Rash     on bilateral legs and lower back       HPI    Mark Buchanan is a 50 y.o. male who presents to ED from home.By car.  With complaint of rash, [poison ivy exposure.  Onset 2 days ago.  Intensity of symptoms positive for itching.  Location of symptoms legs and face.      REVIEW OF SYSTEMS    All systems reviewed and positives are in the HPI      PAST MEDICAL HISTORY    History reviewed. No pertinent past medical history.    SURGICAL HISTORY    History reviewed. No pertinent surgical history.    CURRENT MEDICATIONS    Current Outpatient Rx   Medication Sig Dispense Refill   ??? predniSONE (DELTASONE) 20 MG tablet 2 times a day ??3 days then 1 tablet daily 10 tablet 0   ??? aspirin 81 MG tablet Take 81 mg by mouth daily     ??? atorvastatin (LIPITOR) 20 MG tablet Take 20 mg by mouth daily     ??? cilostazol (PLETAL) 100 MG tablet Take 1 tablet by mouth 2 times daily 60 tablet 3   ??? diclofenac (VOLTAREN) 50 MG EC tablet Take 1 tablet by mouth 3 times daily (with meals) 60 tablet 3   ??? lisinopril (PRINIVIL;ZESTRIL) 20 MG tablet Take 1 tablet by mouth daily 90 tablet 2   ??? albuterol (PROVENTIL HFA) 108 (90 BASE) MCG/ACT inhaler Inhale 2 puffs into the lungs every 6 hours as needed for Wheezing 1 Inhaler 3       ALLERGIES    No Known Allergies    FAMILY HISTORY    History reviewed. No pertinent family history.    SOCIAL HISTORY    Social History     Social History   ??? Marital status: Married     Spouse name: N/A   ??? Number of children: N/A   ??? Years of education: N/A     Social History Main Topics   ??? Smoking status: Former Smoker     Packs/day: 1.00     Types: Cigarettes   ??? Smokeless tobacco: None   ??? Alcohol use No   ??? Drug use: No   ??? Sexual activity: Not Asked     Other Topics Concern   ??? None     Social History Narrative       PHYSICAL EXAM    VITAL SIGNS: BP (!) 128/97   Pulse 98   Temp 98.5 ??F (36.9 ??C)   Resp  20   SpO2 99%  Constitutional:  Well developed, well nourished, no acute distress, non-toxic appearance   HENT:  Atraumatic, external ears normal, nose normal, oropharynx moist, no pharyngeal exudates. Neck- supple   Respiratory:  No respiratory distress, normal breath sounds   Cardiovascular:  Normal rate, normal rhythm, no murmurs, no gallops, no rubs   GI:  Soft, nondistended, normal bowel sounds, nontender, no organomegaly   Musculoskeletal:  No edema, no tenderness, no deformities.   Integument:  Papular rash on the legs and face.  Poison ivy dermatitis       RADIOLOGY/PROCEDURES    No orders to display         Labs  Labs Reviewed - No data to display        Summation  Patient Course: The warning signs were discussed.  Return to ED if worse.    ED Medications administered this visit:    Medications   predniSONE (DELTASONE) tablet 40 mg (not administered)       New Prescriptions from this visit:    New Prescriptions    PREDNISONE (DELTASONE) 20 MG TABLET    2 times a day ??3 days then 1 tablet daily       Follow-up:  Evillo Demetrio Lapping  [address]  [place] [address]  [phone]      As needed, If symptoms worsen        Final Impression:   1. Poison ivy dermatitis               (Please note that portions of this note were completed with a voice recognition program.  Efforts were made to edit the dictations but occasionally words are mis-transcribed.)           Deanna Artis, MD  01/22/16 1924

## 2016-01-22 NOTE — ED Triage Notes (Signed)
Medication list verified with verbal recall from patient.

## 2016-02-14 ENCOUNTER — Emergency Department: Admit: 2016-02-14 | Payer: PRIVATE HEALTH INSURANCE | Primary: Internal Medicine

## 2016-02-14 ENCOUNTER — Inpatient Hospital Stay
Admit: 2016-02-14 | Discharge: 2016-02-15 | Disposition: A | Discharge status: Home / Self Care | Payer: PRIVATE HEALTH INSURANCE | Source: Ambulatory Visit | Admitting: Family Medicine

## 2016-02-14 DIAGNOSIS — S56222A Laceration of other flexor muscle, fascia and tendon at forearm level, left arm, initial encounter: Secondary | ICD-10-CM

## 2016-02-14 LAB — BASIC METABOLIC PANEL
Anion Gap: 9 mmol/L (ref 9–17)
BUN: 9 mg/dL (ref 6–20)
Bun/Cre Ratio: 7 — ABNORMAL LOW (ref 9–20)
CO2: 25 mmol/L (ref 20–31)
Calcium: 9.2 mg/dL (ref 8.6–10.4)
Chloride: 106 mmol/L (ref 98–107)
Creatinine: 1.33 mg/dL — ABNORMAL HIGH (ref 0.70–1.20)
GFR African American: >60 mL/min (ref >60)
GFR Non-African American: 57 mL/min — ABNORMAL LOW (ref >60)
Glucose: 140 mg/dL — ABNORMAL HIGH (ref 70–99)
Potassium: 3.4 mmol/L — ABNORMAL LOW (ref 3.7–5.3)
Sodium: 140 mmol/L (ref 135–144)

## 2016-02-14 LAB — TYPE AND SCREEN
ABO/Rh: A POS
Antibody Screen: NEGATIVE

## 2016-02-14 LAB — CBC WITH AUTO DIFFERENTIAL
Absolute Eos #: 0.3 10*3/uL (ref 0.0–0.4)
Absolute Lymph #: 2.7 10*3/uL — ABNORMAL HIGH (ref 0.9–2.5)
Absolute Mono #: 0.5 10*3/uL (ref 0.0–1.0)
Basophils Absolute: 0 10*3/uL (ref 0.0–0.2)
Basophils: 1 %
Eosinophils %: 4 %
Hematocrit: 38.9 % — ABNORMAL LOW (ref 41–53)
Hemoglobin: 13.1 g/dL — ABNORMAL LOW (ref 13.5–17.5)
Lymphocytes: 34 %
MCH: 31.8 pg (ref 26–34)
MCHC: 33.7 g/dL (ref 31–37)
MCV: 94.4 fL (ref 80–100)
Monocytes: 6 %
Platelets: 261 10*3/uL (ref 140–450)
RBC: 4.12 m/uL — ABNORMAL LOW (ref 4.5–5.9)
RDW: 13.6 % (ref 12.1–15.2)
Seg Neutrophils: 55 %
Segs Absolute: 4.4 10*3/uL (ref 2.1–6.5)
WBC: 8 10*3/uL (ref 3.5–11.0)

## 2016-02-14 LAB — PROTIME-INR
INR: 1
Protime: 10.3 s (ref 9.0–11.6)

## 2016-02-14 LAB — ETHANOL
Ethanol percent: <0.01 %
Ethanol: <10 mg/dL (ref <10)

## 2016-02-14 LAB — APTT: PTT: 23.6 s (ref 21.0–33.0)

## 2016-02-14 LAB — AMYLASE: Amylase: 31 U/L (ref 28–100)

## 2016-02-14 MED ORDER — CLINDAMYCIN PHOSPHATE IN D5W 300 MG/50ML IV SOLN
300 MG/50ML | Freq: Once | INTRAVENOUS | Status: AC
Start: 2016-02-14 — End: 2016-02-14
  Administered 2016-02-14: 21:00:00 300 mg via INTRAVENOUS

## 2016-02-14 MED ORDER — METOCLOPRAMIDE HCL 5 MG/ML IJ SOLN
5 MG/ML | Freq: Once | INTRAMUSCULAR | Status: AC
Start: 2016-02-14 — End: 2016-02-14
  Administered 2016-02-14: 22:00:00 10 mg via INTRAVENOUS

## 2016-02-14 MED ORDER — LIDOCAINE HCL (PF) 1 % IJ SOLN
1 | INTRAMUSCULAR | Status: AC
Start: 2016-02-14 — End: 2016-02-14

## 2016-02-14 MED ORDER — CEFAZOLIN SODIUM 1 G IJ SOLR
1 g | Freq: Once | INTRAMUSCULAR | Status: AC
Start: 2016-02-14 — End: 2016-02-14
  Administered 2016-02-14: 21:00:00 1 g via INTRAVENOUS

## 2016-02-14 MED ORDER — ALBUTEROL SULFATE (2.5 MG/3ML) 0.083% IN NEBU
Freq: Once | RESPIRATORY_TRACT | Status: AC
Start: 2016-02-14 — End: 2016-02-14
  Administered 2016-02-14: 23:00:00 2.5 mg via RESPIRATORY_TRACT

## 2016-02-14 MED ORDER — SODIUM CHLORIDE 0.9 % IJ SOLN
0.9 % | Freq: Every day | INTRAMUSCULAR | Status: DC
Start: 2016-02-14 — End: 2016-02-14
  Administered 2016-02-14: 22:00:00 10 mL via INTRAVENOUS

## 2016-02-14 MED ORDER — BUPIVACAINE-EPINEPHRINE (PF) 0.5% -1:200000 IJ SOLN
INTRAMUSCULAR | Status: AC
Start: 2016-02-14 — End: 2016-02-14

## 2016-02-14 MED ORDER — FENTANYL CITRATE (PF) 100 MCG/2ML IJ SOLN
100 | INTRAMUSCULAR | Status: AC
Start: 2016-02-14 — End: 2016-02-14

## 2016-02-14 MED ORDER — TETANUS-DIPHTH-ACELL PERTUSSIS 5-2.5-18.5 LF-MCG/0.5 IM SUSP
Freq: Once | INTRAMUSCULAR | Status: AC
Start: 2016-02-14 — End: 2016-02-14
  Administered 2016-02-14: 22:00:00 0.5 mL via INTRAMUSCULAR

## 2016-02-14 MED ORDER — PANTOPRAZOLE SODIUM 40 MG IV SOLR
40 MG | Freq: Once | INTRAVENOUS | Status: AC
Start: 2016-02-14 — End: 2016-02-14
  Administered 2016-02-14: 22:00:00 40 mg via INTRAVENOUS

## 2016-02-14 MED ORDER — LACTATED RINGERS IV SOLN
Freq: Once | INTRAVENOUS | Status: AC
Start: 2016-02-14 — End: 2016-02-14
  Administered 2016-02-14: 22:00:00 via INTRAVENOUS

## 2016-02-14 MED ORDER — ONDANSETRON HCL 4 MG/2ML IJ SOLN
4 MG/2ML | Freq: Once | INTRAMUSCULAR | Status: AC
Start: 2016-02-14 — End: 2016-02-14
  Administered 2016-02-14: 21:00:00 4 mg via INTRAVENOUS

## 2016-02-14 MED ORDER — PROPOFOL 200 MG/20ML IV EMUL
200 | INTRAVENOUS | Status: AC
Start: 2016-02-14 — End: 2016-02-14

## 2016-02-14 MED ORDER — LIDOCAINE HCL 1 % IJ SOLN
1 % | INTRAMUSCULAR | Status: AC
Start: 2016-02-14 — End: 2016-02-14
  Administered 2016-02-14: 21:00:00

## 2016-02-14 MED ORDER — ROCURONIUM BROMIDE 50 MG/5ML IV SOLN
50 | INTRAVENOUS | Status: AC
Start: 2016-02-14 — End: 2016-02-14

## 2016-02-14 MED ORDER — MORPHINE SULFATE (PF) 2 MG/ML IV SOLN
2 MG/ML | Freq: Once | INTRAVENOUS | Status: AC
Start: 2016-02-14 — End: 2016-02-14
  Administered 2016-02-14: 22:00:00 2 mg via INTRAVENOUS

## 2016-02-14 MED ORDER — MIDAZOLAM HCL 2 MG/2ML IJ SOLN
2 | INTRAMUSCULAR | Status: AC
Start: 2016-02-14 — End: 2016-02-14

## 2016-02-14 MED ORDER — MORPHINE SULFATE (PF) 2 MG/ML IV SOLN
2 MG/ML | Freq: Once | INTRAVENOUS | Status: AC
Start: 2016-02-14 — End: 2016-02-14
  Administered 2016-02-14: 21:00:00 2 mg via INTRAVENOUS

## 2016-02-14 MED FILL — MORPHINE SULFATE (PF) 2 MG/ML IV SOLN: 2 mg/mL | INTRAVENOUS | Qty: 1

## 2016-02-14 MED FILL — LIDOCAINE HCL (PF) 1 % IJ SOLN: 1 % | INTRAMUSCULAR | Qty: 5

## 2016-02-14 MED FILL — ALBUTEROL SULFATE (2.5 MG/3ML) 0.083% IN NEBU: RESPIRATORY_TRACT | Qty: 3

## 2016-02-14 MED FILL — CLEOCIN IN D5W 300 MG/50ML IV SOLN: 300 MG/50ML | INTRAVENOUS | Qty: 50

## 2016-02-14 MED FILL — CEFAZOLIN SODIUM 1 G IJ SOLR: 1 g | INTRAMUSCULAR | Qty: 1000

## 2016-02-14 MED FILL — FENTANYL CITRATE (PF) 100 MCG/2ML IJ SOLN: 100 MCG/2ML | INTRAMUSCULAR | Qty: 2

## 2016-02-14 MED FILL — MIDAZOLAM HCL 2 MG/2ML IJ SOLN: 2 MG/ML | INTRAMUSCULAR | Qty: 2

## 2016-02-14 MED FILL — MARCAINE/EPINEPHRINE PF 0.5% -1:200000 IJ SOLN: INTRAMUSCULAR | Qty: 30

## 2016-02-14 MED FILL — METOCLOPRAMIDE HCL 5 MG/ML IJ SOLN: 5 MG/ML | INTRAMUSCULAR | Qty: 2

## 2016-02-14 MED FILL — PROTONIX 40 MG IV SOLR: 40 MG | INTRAVENOUS | Qty: 40

## 2016-02-14 MED FILL — NORMAL SALINE FLUSH 0.9 % IV SOLN: 0.9 % | INTRAVENOUS | Qty: 10

## 2016-02-14 MED FILL — DEXTROSE 5 % IV SOLN: 5 % | INTRAVENOUS | Qty: 50

## 2016-02-14 MED FILL — ONDANSETRON HCL 4 MG/2ML IJ SOLN: 4 MG/2ML | INTRAMUSCULAR | Qty: 2

## 2016-02-14 MED FILL — ZEMURON 50 MG/5ML IV SOLN: 50 MG/5ML | INTRAVENOUS | Qty: 5

## 2016-02-14 MED FILL — PROPOFOL 200 MG/20ML IV EMUL: 200 MG/20ML | INTRAVENOUS | Qty: 40

## 2016-02-14 MED FILL — BOOSTRIX 5-2.5-18.5 IM SUSP: INTRAMUSCULAR | Qty: 0.5

## 2016-02-14 MED FILL — LIDOCAINE HCL 1 % IJ SOLN: 1 % | INTRAMUSCULAR | Qty: 20

## 2016-02-14 NOTE — Anesthesia Pre-Procedure Evaluation (Signed)
Department of Anesthesiology  Preprocedure Note       Name:  Mark Buchanan Age   Age:  50 y.o.  DOB:  [birth year]                                          MRN:  [id number]         Date:  02/14/2016      Surgeon: Moishe SpiceSurgeon(s):  Romie Leveeavid Buchanan Stanbery, MD    Procedure: Procedure(s):  WOUND EXPLORATION AND/OR REVISION    Medications prior to admission:   Prior to Admission medications    Medication Sig Start Date End Date Taking? Authorizing Provider   aspirin 81 MG tablet Take 81 mg by mouth daily   Yes Historical Provider, MD   atorvastatin (LIPITOR) 20 MG tablet Take 20 mg by mouth daily   Yes Historical Provider, MD   lisinopril (PRINIVIL;ZESTRIL) 20 MG tablet Take 1 tablet by mouth daily 10/25/14 10/25/15  Demetrio LappingEvillo M Domingo, MD   albuterol (PROVENTIL HFA) 108 (90 BASE) MCG/ACT inhaler Inhale 2 puffs into the lungs every 6 hours as needed for Wheezing 02/11/14 02/11/15  Demetrio LappingEvillo M Domingo, MD       Current medications:    Current Facility-Administered Medications   Medication Dose Route Frequency Provider Last Rate Last Dose   ??? sodium chloride (PF) 0.9 % injection 10 mL  10 mL Intravenous Daily Clare CharonJeffrey E Sizemore, DO   10 mL at 02/14/16 1820     Current Outpatient Prescriptions   Medication Sig Dispense Refill   ??? aspirin 81 MG tablet Take 81 mg by mouth daily     ??? atorvastatin (LIPITOR) 20 MG tablet Take 20 mg by mouth daily     ??? lisinopril (PRINIVIL;ZESTRIL) 20 MG tablet Take 1 tablet by mouth daily 90 tablet 2   ??? albuterol (PROVENTIL HFA) 108 (90 BASE) MCG/ACT inhaler Inhale 2 puffs into the lungs every 6 hours as needed for Wheezing 1 Inhaler 3       Allergies:  No Known Allergies    Problem List:    Patient Active Problem List   Diagnosis Code   ??? Nicotine addiction F17.200   ??? GERD (gastroesophageal reflux disease) K21.9   ??? Claudication in peripheral vascular disease (HCC) I73.9   ??? Arthralgia of right hip M25.551   ??? Need for pneumococcal vaccination Z23   ??? Need for Tdap vaccination Z23   ??? ASVD (arteriosclerotic vascular  disease) I70.90   ??? PAD (peripheral artery disease) (HCC) I73.9   ??? Claudication (HCC) I73.9   ??? Essential hypertension I10       Past Medical History:  History reviewed. No pertinent past medical history.    Past Surgical History:        Procedure Laterality Date   ??? CORONARY ANGIOPLASTY WITH STENT PLACEMENT         Social History:    Social History   Substance Use Topics   ??? Smoking status: Former Smoker     Packs/day: 1.00     Types: Cigarettes   ??? Smokeless tobacco: Not on file   ??? Alcohol use No                                Counseling given: Not Answered      Vital Signs (Current):  Vitals:    02/14/16 1700 02/14/16 1815   BP: (!) 143/101 (!) 157/93   Pulse: 107 83   Resp: 18 16   Temp: 37.1 ??C (98.7 ??F)    SpO2: 98% 97%   Weight: 135 lb (61.2 kg)                                               BP Readings from Last 3 Encounters:   02/14/16 (!) 157/93   01/22/16 (!) 128/97   01/03/15 109/70       NPO Status: Time of last liquid consumption: 1545                        Time of last solid consumption: 1545                        Date of last liquid consumption:  (1545)                             BMI:   Wt Readings from Last 3 Encounters:   02/14/16 135 lb (61.2 kg)   11/05/14 143 lb (64.9 kg)   10/25/14 145 lb (65.8 kg)     Body mass index is 21.14 kg/(m^2).    CBC:   Lab Results   Component Value Date    WBC 8.0 02/14/2016    RBC 4.12 02/14/2016    HGB 13.1 02/14/2016    HCT 38.9 02/14/2016    MCV 94.4 02/14/2016    RDW 13.6 02/14/2016    PLT 261 02/14/2016       CMP:   Lab Results   Component Value Date    NA 140 02/14/2016    K 3.4 02/14/2016    CL 106 02/14/2016    CO2 25 02/14/2016    BUN 9 02/14/2016    CREATININE 1.33 02/14/2016    GFRAA >60 02/14/2016    LABGLOM 57 02/14/2016    GLUCOSE 140 02/14/2016    CALCIUM 9.2 02/14/2016       POC Tests: No results for input(s): POCGLU, POCNA, POCK, POCCL, POCBUN, POCHEMO, POCHCT in the last 72 hours.    Coags:   Lab Results   Component Value Date    PROTIME 10.3  02/14/2016    INR 1.0 02/14/2016    APTT 23.6 02/14/2016       HCG (If Applicable): No results found for: PREGTESTUR, PREGSERUM, HCG, HCGQUANT     ABGs: No results found for: PHART, PO2ART, PCO2ART, HCO3ART, BEART, O2SATART     Type & Screen (If Applicable):  No results found for: LABABO, LABRH    Anesthesia Evaluation  Patient summary reviewed and Nursing notes reviewed no history of anesthetic complications:   Airway: Mallampati: II  TM distance: <3 FB   Neck ROM: full  Mouth opening: < 3 FB Dental:    (+) upper dentures and lower dentures      Pulmonary:negative ROS and normal exam           Cardiovascular:    (+) hypertension:,       ECG reviewed               ROS comment: PAD  ASVD        Neuro/Psych:   {neg  ROS     GI/Hepatic/Renal:   (+) GERD: no interval change,         Endo/Other: negative ROS         Abdominal:                    Anesthesia Plan    ASA 3 - emergent     general   ( )  Anesthetic plan and risks discussed with patient.    Plan discussed with attending.            Donnald Garre, CRNA   02/14/2016

## 2016-02-14 NOTE — ED Notes (Signed)
Last oral intake at [address]1545     Duvall Comes J Damia Bobrowski, [place]  02/14/16 1725

## 2016-02-14 NOTE — ED Notes (Addendum)
1845 tourniquet off.      Jamie BrookesAlan J Alexsandra Shontz, RN  02/14/16 1853       Jamie BrookesAlan J Desmond Szabo, RN  02/14/16 [phone]

## 2016-02-14 NOTE — ED Notes (Signed)
Tourniquet reapplied      Bonnell Publicennis J Fransisco Messmer, RN  02/14/16 [phone]

## 2016-02-14 NOTE — Progress Notes (Signed)
Subjective: Denies pain denies shortness of breath.  Reports he can feel his fingers.      Objective: chest clear to ascultation no rales no rhonchi card rrr radial artery pulse and pt and dp bilaterally can appose thumb to fingers ab and adduct fingers can flex and extend wrist.  Dressings intact jp drainage bloody.      Assessment: stable post op    Plan: all of the patient's questions answered.  All of the patient's family questions answered.    Romie Leveeavid L Shama Monfils, MD
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
## 2016-02-14 NOTE — ED Notes (Signed)
touniquet on     Jamie Brookeslan J Keasia Dubose, RN  02/14/16 [phone]

## 2016-02-14 NOTE — ED Notes (Signed)
tourniquet loosened      Bonnell Publicennis J Ceilidh Torregrossa, RN  02/14/16 1719

## 2016-02-14 NOTE — ED Notes (Signed)
Tourniquet off.      Mark BrookesAlan J Aemon Koeller, RN  02/14/16 1905

## 2016-02-14 NOTE — ED Notes (Signed)
Tourniquet off     Bonnell PublicDennis J Hye Trawick, RN  02/14/16 1730

## 2016-02-14 NOTE — ED Notes (Addendum)
Tourniquet off     Bonnell Publicennis J Parminder Trapani, RN  02/14/16 1812       Bonnell Publicennis J Layla Kesling, RN  02/14/16 [phone]

## 2016-02-14 NOTE — ED Notes (Signed)
Tourniquet on, able to move fingers of left hand, strong radial pulses.     Jamie BrookesAlan J Sebastian Dzik, RN  02/14/16 [phone]

## 2016-02-14 NOTE — Progress Notes (Signed)
TOURNIQUET DOWN- 2043/ REAPPLIED 2048
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
## 2016-02-14 NOTE — ED Notes (Signed)
Tourniquet off strong radial pulses, normal sensation fingers left hand.     Jamie BrookesAlan J Briaunna Grindstaff, RN  02/14/16 1931

## 2016-02-14 NOTE — ED Notes (Signed)
Tourniquet reapplied     Bonnell Publicennis J Tzirel Leonor, RN  02/14/16 [phone]

## 2016-02-14 NOTE — Anesthesia Post-Procedure Evaluation (Signed)
Department of Anesthesiology  Postprocedure Note    Patient: Mark Buchanan  MRN: [id number]  Birthdate: [birth year]  Date of evaluation: 02/14/2016  Time:  9:58 PM     Procedure Summary     Date Anesthesia Start Anesthesia Stop Room / Location    02/14/16 1957 2156 [facility] OR 01 / [facility]       Procedure Diagnosis Surgeon Responsible Provider    WOUND EXPLORATION AND REVISION (Left ) Arm laceration, left, initial encounter  (arm laceration) Romie Leveeavid L Stanbery, MD Donnald GarreMichael J Rodricus Candelaria, CRNA          Anesthesia Type: general    Aldrete Phase I: Aldrete Score: 8    Aldrete Phase II:      Last vitals:  BP (!) 144/82 (02/14/16 2154)    Temp 36.1 ??C (96.9 ??F) (02/14/16 2154)    Pulse 78 (02/14/16 2154)   Resp 10 (02/14/16 2154)    SpO2 100 % (02/14/16 2154)      Anesthesia Post Evaluation    Patient location during evaluation: PACU  Patient participation: complete - patient participated  Level of consciousness: awake  Pain score: 0  Airway patency: patent  Nausea & Vomiting: no nausea  Complications: no  Cardiovascular status: hemodynamically stable  Respiratory status: acceptable and nasal cannula  Hydration status: euvolemic

## 2016-02-14 NOTE — ED Notes (Signed)
Tourniquet reapplied      Bonnell Publicennis J Deaaron Fulghum, RN  02/14/16 1730

## 2016-02-14 NOTE — ED Notes (Signed)
Tourniquet off.     Jamie BrookesAlan J Zuhair Lariccia, RN  02/14/16 [phone]

## 2016-02-14 NOTE — ED Notes (Signed)
tourniquet applied     Bonnell Publicennis J Gerritt Galentine, RN  02/14/16 [phone]

## 2016-02-14 NOTE — ED Triage Notes (Signed)
Med list in progress

## 2016-02-14 NOTE — ED Notes (Signed)
Tourniquet on.     Jamie BrookesAlan J Joshawa Dubin, RN  02/14/16 1924

## 2016-02-14 NOTE — ED Notes (Signed)
Tourniquet on      Bonnell PublicDennis J Kobi Mario, RN  02/14/16 [phone]

## 2016-02-14 NOTE — ED Notes (Signed)
Tourniquet on strong radial pulses brisk capillary refill .     Jamie BrookesAlan J Mayra Jolliffe, RN  02/14/16 [phone]

## 2016-02-14 NOTE — ED Notes (Signed)
Tourniquet off     Bonnell PublicDennis J Chelsae Zanella, RN  02/14/16 [phone]

## 2016-02-14 NOTE — Progress Notes (Signed)
TOURNIQUET DOWN- 2105

## 2016-02-14 NOTE — ED Notes (Addendum)
Tourniquet off     Bonnell Publicennis J Graham Doukas, RN  02/14/16 1803       Bonnell Publicennis J Debora Stockdale, RN  02/14/16 1813       Bonnell Publicennis J Bindu Docter, RN  02/14/16 [phone]

## 2016-02-14 NOTE — ED Notes (Signed)
Dr Dub MikesStanbery at bedside     Bonnell Publicennis J Teofilo Lupinacci, RN  02/14/16 [phone]
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
## 2016-02-14 NOTE — Progress Notes (Signed)
Pt to floor via stretcher.  Pt able to move over to bed on his own.  Surgical dressing is CDI.  Tourniquet is in place on upper left arm, but not inflated at this time.  JP drain has scant serosanguinous drainage.  Dr Dub MikesStanbery stops in pts room to update on plan of care.  Ace wraps applied to BLE.  Pt requests pain medication when available.  Family at bedside.  Frozen dinner provided for pt.  Call light in reach.

## 2016-02-14 NOTE — ED Notes (Signed)
Tourniquet reapplied cap refill >2seconds      Bonnell PublicDennis J Drue Camera, RN  02/14/16 1721

## 2016-02-15 DIAGNOSIS — B353 Tinea pedis: Secondary | ICD-10-CM | POA: Diagnosis not present

## 2016-02-15 LAB — CBC WITH MANUAL DIFFERENTIAL
Absolute Bands #: 0.48 10*3/uL (ref 0.0–1.0)
Absolute Eos #: 0.24 10*3/uL (ref 0.0–0.4)
Absolute Lymph #: 2.72 10*3/uL — ABNORMAL HIGH (ref 0.9–2.5)
Absolute Mono #: 0.56 10*3/uL (ref 0.0–1.0)
Bands: 6 %
Eosinophils %: 3 %
Hematocrit: 32.3 % — ABNORMAL LOW (ref 41–53)
Hemoglobin: 10.7 g/dL — ABNORMAL LOW (ref 13.5–17.5)
Lymphocytes: 34 %
MCH: 31.3 pg (ref 26–34)
MCHC: 33.2 g/dL (ref 31–37)
MCV: 94.4 fL (ref 80–100)
Monocytes: 7 %
Platelets: 197 10*3/uL (ref 140–450)
RBC: 3.42 m/uL — ABNORMAL LOW (ref 4.5–5.9)
RDW: 14.4 % (ref 12.1–15.2)
Seg Neutrophils: 50 %
Segs Absolute: 4 10*3/uL (ref 2.1–6.5)
WBC: 8 10*3/uL (ref 3.5–11.0)

## 2016-02-15 MED ORDER — CLINDAMYCIN HCL 300 MG PO CAPS
300 MG | ORAL_CAPSULE | Freq: Three times a day (TID) | ORAL | 0 refills | Status: AC
Start: 2016-02-15 — End: 2016-02-25

## 2016-02-15 MED ORDER — NEOMYCIN-POLYMYXIN B GU 40-200000 IR SOLN
40-200000 | Status: AC
Start: 2016-02-15 — End: 2016-02-14

## 2016-02-15 MED ORDER — LACTATED RINGERS IV SOLN
Freq: Once | INTRAVENOUS | Status: AC
Start: 2016-02-15 — End: 2016-02-15
  Administered 2016-02-15: 12:00:00 via INTRAVENOUS

## 2016-02-15 MED ORDER — ONDANSETRON HCL 4 MG/2ML IJ SOLN
4 MG/2ML | INTRAMUSCULAR | Status: DC | PRN
Start: 2016-02-15 — End: 2016-02-14
  Administered 2016-02-15: 01:00:00 4 via INTRAVENOUS

## 2016-02-15 MED ORDER — DEXTROSE 5 % IV SOLN (MINI-BAG)
5 % | INTRAVENOUS | Status: DC
Start: 2016-02-15 — End: 2016-02-14

## 2016-02-15 MED ORDER — BUPIVACAINE HCL 0.25 % IJ SOLN
0.25 % | INTRAMUSCULAR | Status: DC | PRN
Start: 2016-02-15 — End: 2016-02-15
  Administered 2016-02-15: 02:00:00 5 via INTRADERMAL

## 2016-02-15 MED ORDER — CEFAZOLIN SODIUM 1 G IJ SOLR
1 g | Freq: Three times a day (TID) | INTRAMUSCULAR | Status: DC
Start: 2016-02-15 — End: 2016-02-15
  Administered 2016-02-15 (×2): 1 g via INTRAVENOUS

## 2016-02-15 MED ORDER — PROPOFOL 200 MG/20ML IV EMUL
200 MG/20ML | INTRAVENOUS | Status: DC | PRN
Start: 2016-02-15 — End: 2016-02-14
  Administered 2016-02-15: 160 via INTRAVENOUS

## 2016-02-15 MED ORDER — NORMAL SALINE FLUSH 0.9 % IV SOLN
0.9 % | INTRAVENOUS | Status: AC
Start: 2016-02-15 — End: 2016-02-15
  Administered 2016-02-15: 06:00:00

## 2016-02-15 MED ORDER — ALBUTEROL SULFATE (2.5 MG/3ML) 0.083% IN NEBU
Freq: Four times a day (QID) | RESPIRATORY_TRACT | Status: DC
Start: 2016-02-15 — End: 2016-02-15
  Administered 2016-02-15 (×2): 2.5 mg via RESPIRATORY_TRACT

## 2016-02-15 MED ORDER — FENTANYL CITRATE (PF) 100 MCG/2ML IJ SOLN
100 MCG/2ML | INTRAMUSCULAR | Status: DC | PRN
Start: 2016-02-15 — End: 2016-02-14
  Administered 2016-02-14 – 2016-02-15 (×2): 50 via INTRAVENOUS

## 2016-02-15 MED ORDER — HYDROMORPHONE HCL 1 MG/ML IJ SOLN
1 | INTRAMUSCULAR | Status: AC
Start: 2016-02-15 — End: 2016-02-14

## 2016-02-15 MED ORDER — BACITRACIN-NEOMYCIN-POLYMYXIN 400-5-5000 EX OINT
400-5-5000 | Freq: Once | CUTANEOUS | Status: DC
Start: 2016-02-15 — End: 2016-02-15

## 2016-02-15 MED ORDER — LACTATED RINGERS IV SOLN
INTRAVENOUS | Status: DC | PRN
Start: 2016-02-15 — End: 2016-02-14
  Administered 2016-02-14 – 2016-02-15 (×2): via INTRAVENOUS

## 2016-02-15 MED ORDER — LACTATED RINGERS IV SOLN
INTRAVENOUS | Status: DC
Start: 2016-02-15 — End: 2016-02-14

## 2016-02-15 MED ORDER — SUCCINYLCHOLINE CHLORIDE 20 MG/ML IJ SOLN
20 MG/ML | INTRAMUSCULAR | Status: DC | PRN
Start: 2016-02-15 — End: 2016-02-14
  Administered 2016-02-15: 100 via INTRAVENOUS

## 2016-02-15 MED ORDER — LIDOCAINE HCL (PF) 1 % IJ SOLN
1 % | INTRAMUSCULAR | Status: DC | PRN
Start: 2016-02-15 — End: 2016-02-14
  Administered 2016-02-15: 40 via INTRAVENOUS

## 2016-02-15 MED ORDER — CLINDAMYCIN PHOSPHATE IN D5W 300 MG/50ML IV SOLN
300 MG/50ML | Freq: Four times a day (QID) | INTRAVENOUS | Status: DC
Start: 2016-02-15 — End: 2016-02-15
  Administered 2016-02-15 (×3): 300 mg via INTRAVENOUS

## 2016-02-15 MED ORDER — HYDROMORPHONE HCL 1 MG/ML IJ SOLN
1 MG/ML | INTRAMUSCULAR | Status: DC | PRN
Start: 2016-02-15 — End: 2016-02-14
  Administered 2016-02-15: 02:00:00 0.5 via INTRAVENOUS

## 2016-02-15 MED ORDER — MIDAZOLAM HCL 2 MG/2ML IJ SOLN
2 MG/ML | INTRAMUSCULAR | Status: DC | PRN
Start: 2016-02-15 — End: 2016-02-14
  Administered 2016-02-14: 2 via INTRAVENOUS

## 2016-02-15 MED ORDER — ROCURONIUM BROMIDE 50 MG/5ML IV SOLN
50 MG/5ML | INTRAVENOUS | Status: DC | PRN
Start: 2016-02-15 — End: 2016-02-14
  Administered 2016-02-15: 5 via INTRAVENOUS

## 2016-02-15 MED ORDER — CEPHALEXIN 250 MG PO CAPS
250 MG | ORAL_CAPSULE | Freq: Three times a day (TID) | ORAL | 1 refills | Status: DC
Start: 2016-02-15 — End: 2016-03-22

## 2016-02-15 MED ORDER — BUPIVACAINE HCL (PF) 0.25 % IJ SOLN
0.25 | INTRAMUSCULAR | Status: AC
Start: 2016-02-15 — End: 2016-02-14

## 2016-02-15 MED ORDER — OXYCODONE-ACETAMINOPHEN 5-325 MG PO TABS
5-325 MG | ORAL_TABLET | Freq: Four times a day (QID) | ORAL | 0 refills | Status: AC | PRN
Start: 2016-02-15 — End: 2016-02-25

## 2016-02-15 MED ORDER — LIDOCAINE HCL (PF) 1 % IJ SOLN
1 | INTRAMUSCULAR | Status: AC
Start: 2016-02-15 — End: 2016-02-14

## 2016-02-15 MED ORDER — ONDANSETRON HCL 4 MG/2ML IJ SOLN
4 | INTRAMUSCULAR | Status: AC
Start: 2016-02-15 — End: 2016-02-14

## 2016-02-15 MED ORDER — NEOMYCIN-POLYMYXIN B GU 40-200000 IR SOLN
40-200000 | Status: DC | PRN
Start: 2016-02-15 — End: 2016-02-14
  Administered 2016-02-15: 01:00:00 500

## 2016-02-15 MED ORDER — ONDANSETRON HCL 4 MG/2ML IJ SOLN
4 MG/2ML | Freq: Four times a day (QID) | INTRAMUSCULAR | Status: DC | PRN
Start: 2016-02-15 — End: 2016-02-15

## 2016-02-15 MED ORDER — MORPHINE SULFATE (PF) 4 MG/ML IV SOLN
4 MG/ML | INTRAVENOUS | Status: DC | PRN
Start: 2016-02-15 — End: 2016-02-15
  Administered 2016-02-15 (×3): 4 mg via INTRAVENOUS

## 2016-02-15 MED FILL — CEFAZOLIN SODIUM 1 G IJ SOLR: 1 g | INTRAMUSCULAR | Qty: 1000

## 2016-02-15 MED FILL — DEXTROSE 5 % IV SOLN: 5 % | INTRAVENOUS | Qty: 50

## 2016-02-15 MED FILL — LIDOCAINE HCL (PF) 1 % IJ SOLN: 1 % | INTRAMUSCULAR | Qty: 5

## 2016-02-15 MED FILL — MORPHINE SULFATE (PF) 4 MG/ML IV SOLN: 4 mg/mL | INTRAVENOUS | Qty: 1

## 2016-02-15 MED FILL — ONDANSETRON HCL 4 MG/2ML IJ SOLN: 4 MG/2ML | INTRAMUSCULAR | Qty: 2

## 2016-02-15 MED FILL — BACITRACIN-NEOMYCIN-POLYMYXIN 400-5-5000 EX OINT: 400-5-5000 | CUTANEOUS | Qty: 28.35

## 2016-02-15 MED FILL — NEOMYCIN-POLYMYXIN B GU 40-200000 IR SOLN: 40-200000 | Qty: 1

## 2016-02-15 MED FILL — ALBUTEROL SULFATE (2.5 MG/3ML) 0.083% IN NEBU: RESPIRATORY_TRACT | Qty: 3

## 2016-02-15 MED FILL — CLEOCIN IN D5W 300 MG/50ML IV SOLN: 300 MG/50ML | INTRAVENOUS | Qty: 50

## 2016-02-15 MED FILL — BUPIVACAINE HCL (PF) 0.25 % IJ SOLN: 0.25 % | INTRAMUSCULAR | Qty: 30

## 2016-02-15 MED FILL — NORMAL SALINE FLUSH 0.9 % IV SOLN: 0.9 % | INTRAVENOUS | Qty: 30

## 2016-02-15 MED FILL — HYDROMORPHONE HCL 1 MG/ML IJ SOLN: 1 MG/ML | INTRAMUSCULAR | Qty: 1

## 2016-02-15 NOTE — Progress Notes (Signed)
Dr. Dub MikesStanbery calls in to check on pt.  Nurse notified him of the bleeding starting to come through the ace wrap.  No new orders at this time.

## 2016-02-15 NOTE — Discharge Instructions (Signed)

## 2016-02-15 NOTE — Progress Notes (Signed)
Nutrition Assessment    Type and Reason for Visit: Initial    Nutrition Recommendations:  Continue current nutrition plan    Malnutrition Assessment:  ?? Malnutrition Status: No malnutrition    Nutrition Diagnosis:   ?? Problem: Increased nutrient needs  ?? Etiology: related to Acute injury/trauma    ??? Signs and symptoms:  as evidenced by Presence of wounds (healing needs)    Nutrition Assessment:  ?? Subjective Assessment: No ingestion, appetite issues or concerns.  Asks when breakfast coming.   ?? Wound Type: Deep Tissue Injury  ?? Current Nutrition Therapies:  ?? Oral Diet Orders: General   ?? Oral Diet intake: Unable to assess (no record.  had frozen dinner last evening)  ?? Oral Nutrition Supplement (ONS) Orders: None  ?? Anthropometric Measures:  ?? Ht: 5\' 7"  (170.2 cm)   ?? Current Body Wt: 140 lb (63.5 kg) (stated)  ?? BMI Classification: BMI 18.5 - 24.9 Normal Weight    Lab Results   Component Value Date    NA 140 02/14/2016    K 3.4 (L) 02/14/2016    CL 106 02/14/2016    CO2 25 02/14/2016    BUN 9 02/14/2016    CREATININE 1.33 (H) 02/14/2016    GLUCOSE 140 (H) 02/14/2016    CALCIUM 9.2 02/14/2016    LABGLOM 57 (L) 02/14/2016    GFRAA >60 02/14/2016     No results found for: LABA1C  No results found for: EAG    Estimated Intake vs Estimated Needs: Insufficient Data    Nutrition Risk Level: Low    Eager to eat.  Understands increased energy and hydration needs for wound healing.     Nutrition Interventions:   Continue current diet  Continued Inpatient Monitoring, Education Not Indicated, Coordination of Care    Nutrition Evaluation:   ?? Evaluation: Goals set   ?? Goals: PO >75% meals     ?? Monitoring: Meal Intake, Weight, Pertinent Labs    Electronically signed by Gerald Dexterim Irish Breisch, RD, LD on 02/15/16 at 7:49 AM    Contact Number: [phone]

## 2016-02-15 NOTE — Progress Notes (Signed)
Pt has minimal bleeding below left elbow.  It is approximately 2in by 2in, just starting to seep through ace wrap.  Will continue to monitor bleeding.

## 2016-02-15 NOTE — Plan of Care (Signed)
Problem: Pain:  Goal: Pain level will decrease  Pain level will decrease   Outcome: Ongoing

## 2016-02-15 NOTE — Progress Notes (Signed)
Writer replaces old towel that was absorbing the serosanguinous drainge from below the left elbow with a clean towel.  Old towel has area approximately 3in by 3 in of drainage.  At this point, the bleeding has slowed and it appears there is scant sanguinous drainage on the new towel.
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
## 2016-02-15 NOTE — Progress Notes (Signed)
Dr. Dub MikesStanbery in to see patient and perform dressing change.

## 2016-02-15 NOTE — Discharge Instructions (Signed)
[place] sling to support left arm when up and about.  Keep dressings clean and dry all times  Elevate left arm on pillows to a little above level of heart.  See Dr. Dub MikesStanbery [phone]   Tomorrow for "wound check"

## 2016-02-15 NOTE — Progress Notes (Signed)
Discharge instructions reviewed with patient and spouse. Both expressed understanding of instructions given. Patient was escorted out via wheelchair with all belongings. Supplies given to patient for dressing change to be completed in Dr. Runell GessStanbery's office.
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
## 2016-02-15 NOTE — Progress Notes (Signed)
Pod #1      Subjective: Wife at the bedside.   She asks many questions about wound care.  Patient reports he is more comfortable but still has pain. He denies nausea and he has several appropriate wound care and follow up questions.      Objective: chest slow expiration no rales card rrr abdomen soft.  He has tolerated regular diet.  I ask him if he would prefer to remain in hospital and he says he is comfortable going home and doing outpatient follow up.  I do dressing changes.  Wounds dressings are taken down to skin level.  Wounds are cleansed dried smeared with neosporin ointment and dressed with xeroform gauze and multiple layers of sterile gauze then held in place with ace wrap.  His jp drainage has been low but is bloody.   He can do thumb appose to each finger flex and extend wrist and pronate slowly.  He can abduct and adduct fingers and pinch sensation is present over all surfaces. Red blood level is 10.       Assessment: multiple lacerations left lower arm and 3rd digit with hemorrhage arterial on Plavix and aspirin.      Plan: all of their questions answered.  Wife carefully watches wound care and feels confident she can duplicate.  I discuss medicines, activity, bathing follow up complications and emergency responses.  Discharge to out patient follow up    Romie Leveeavid L Mathilde Mcwherter, MD

## 2016-02-16 DIAGNOSIS — H5319 Other subjective visual disturbances: Secondary | ICD-10-CM | POA: Diagnosis not present

## 2016-02-16 LAB — EKG 12-LEAD
Atrial Rate: 89 {beats}/min
P Axis: 81 degrees
P-R Interval: 128 ms
Q-T Interval: 354 ms
QRS Duration: 70 ms
QTc Calculation (Bazett): 430 ms
R Axis: 67 degrees
T Axis: 58 degrees
Ventricular Rate: 89 {beats}/min

## 2016-02-16 NOTE — Telephone Encounter (Signed)
Called pt, unable to reach pt 8/17, 8/18.

## 2016-03-22 ENCOUNTER — Inpatient Hospital Stay
Admit: 2016-03-22 | Discharge: 2016-03-22 | Disposition: A | Discharge status: Home / Self Care | Payer: PRIVATE HEALTH INSURANCE | Attending: Emergency Medicine

## 2016-03-22 DIAGNOSIS — M436 Torticollis: Secondary | ICD-10-CM

## 2016-03-22 MED ORDER — KETOROLAC TROMETHAMINE 30 MG/ML IJ SOLN
30 MG/ML | Freq: Once | INTRAMUSCULAR | Status: AC
Start: 2016-03-22 — End: 2016-03-22
  Administered 2016-03-22: 16:00:00 30 mg via INTRAMUSCULAR

## 2016-03-22 MED ORDER — HYDROCODONE-ACETAMINOPHEN 5-325 MG PO TABS
5-325 MG | Freq: Once | ORAL | Status: AC
Start: 2016-03-22 — End: 2016-03-22
  Administered 2016-03-22: 17:00:00 2 via ORAL

## 2016-03-22 MED ORDER — NAPROXEN 500 MG PO TABS
500 MG | ORAL_TABLET | Freq: Two times a day (BID) | ORAL | 0 refills | Status: DC
Start: 2016-03-22 — End: 2017-07-29

## 2016-03-22 MED ORDER — CYCLOBENZAPRINE HCL 10 MG PO TABS
10 MG | ORAL_TABLET | Freq: Three times a day (TID) | ORAL | 0 refills | Status: AC | PRN
Start: 2016-03-22 — End: 2016-03-26

## 2016-03-22 MED ORDER — ORPHENADRINE CITRATE 30 MG/ML IJ SOLN
30 MG/ML | Freq: Once | INTRAMUSCULAR | Status: AC
Start: 2016-03-22 — End: 2016-03-22
  Administered 2016-03-22: 16:00:00 60 mg via INTRAMUSCULAR

## 2016-03-22 MED FILL — KETOROLAC TROMETHAMINE 30 MG/ML IJ SOLN: 30 MG/ML | INTRAMUSCULAR | Qty: 1

## 2016-03-22 MED FILL — ORPHENADRINE CITRATE 30 MG/ML IJ SOLN: 30 MG/ML | INTRAMUSCULAR | Qty: 2

## 2016-03-22 MED FILL — HYDROCODONE-ACETAMINOPHEN 5-325 MG PO TABS: 5-325 MG | ORAL | Qty: 2

## 2016-03-22 NOTE — ED Provider Notes (Addendum)
eMERGENCY dEPARTMENT eNCOUnter        CHIEF COMPLAINT    Chief Complaint   Patient presents with   ??? Torticollis     started two days ago with stiff neck on the right side when he woke up in the morning       HPI    Mark Buchanan is a 50 y.o. male who presents to ED from home.  By car.  With complaint of right-sided neck pain.  Patient states that he woke up 2 days ago with the pain the right side of the neck.  No injury.  Onset 2 days ago.  Intensity of symptoms moderate to severe.  Location of symptoms right neck.  Patient denies injury.  Denies accidents    REVIEW OF SYSTEMS    All systems reviewed and positives are in the HPI.     PAST MEDICAL HISTORY    Past Medical History:   Diagnosis Date   ??? Hyperlipidemia        SURGICAL HISTORY    Past Surgical History:   Procedure Laterality Date   ??? CORONARY ANGIOPLASTY WITH STENT PLACEMENT     ??? EXPLORATION OF WOUND OF EXTREMITY Left 02/14/2016    WOUND EXPLORATION AND REVISION performed by Romie Leveeavid L Stanbery, [facility] at [facility]       CURRENT MEDICATIONS      Current Outpatient Rx   Medication Sig Dispense Refill   ??? clopidogrel (PLAVIX) 75 MG tablet Take 75 mg by mouth daily     ??? aspirin 81 MG tablet Take 81 mg by mouth daily     ??? cyclobenzaprine (FLEXERIL) 10 MG tablet Take 1 tablet by mouth 3 times daily as needed for Muscle spasms 12 tablet 0   ??? naproxen (NAPROSYN) 500 MG tablet Take 1 tablet by mouth 2 times daily 12 tablet 0   ??? atorvastatin (LIPITOR) 20 MG tablet Take 20 mg by mouth daily     ??? albuterol (PROVENTIL HFA) 108 (90 BASE) MCG/ACT inhaler Inhale 2 puffs into the lungs every 6 hours as needed for Wheezing 1 Inhaler 3       ALLERGIES      No Known Allergies    FAMILY HISTORY    History reviewed. No pertinent family history.    SOCIAL HISTORY    Social History     Social History   ??? Marital status: Married     Spouse name: N/A   ??? Number of children: N/A   ??? Years of education: N/A     Social History Main Topics   ??? Smoking status: Former Smoker      Packs/day: 1.00     Types: Cigarettes   ??? Smokeless tobacco: None   ??? Alcohol use No   ??? Drug use: No   ??? Sexual activity: Not Asked     Other Topics Concern   ??? None     Social History Narrative       PHYSICAL EXAM    VITAL SIGNS: BP (!) 152/89   Pulse 88   Temp 98.6 ??F (37 ??C) (Oral)    Resp 18   SpO2 100%   Constitutional:  Well developed, well nourished, no acute distress, non-toxic appearance HENT:  Atraumatic, external ears normal, nose normal, oropharynx moist. Neck- patient has torticollis causing his head to the left.  Patient has pain with turning his head to the right.  Respiratory:  No respiratory distress, normal breath sounds.   Cardiovascular:  Normal rate, normal rhythm, no murmurs, no gallops, no rubs   GI:  Soft, nondistended, nontender, no organomegaly, no mass, no rebound, no guarding   GU:  No costovertebral angle tenderness   Musculoskeletal:  No edema, no tenderness, no deformities.    Integument:  Well hydrated   Neurologic:  No deficits.  EKG        RADIOLOGY/PROCEDURES    No orders to display       Labs  Labs Reviewed - No data to display        Summation      Patient Course: Patient is sent home to use moist heat.  For persistent symptoms or new symptoms return to ED.    ED Medications administered this visit:    Medications   HYDROcodone-acetaminophen (NORCO) 5-325 MG per tablet 2 tablet (not administered)   orphenadrine (NORFLEX) injection 60 mg (60 mg Intramuscular Given 03/22/16 1207)   ketorolac (TORADOL) injection 30 mg (30 mg Intramuscular Given 03/22/16 1208)       New Prescriptions from this visit:    New Prescriptions    CYCLOBENZAPRINE (FLEXERIL) 10 MG TABLET    Take 1 tablet by mouth 3 times daily as needed for Muscle spasms    NAPROXEN (NAPROSYN) 500 MG TABLET    Take 1 tablet by mouth 2 times daily       Follow-up:  Demetrio LappingEvillo M Domingo, MD  [address]  [place] [id number]  [phone]    In 2 days  As needed, If symptoms worsen        Final Impression:   1. Torticollis                (Please note that portions of this note were completed with a voice recognition program.  Efforts were made to edit the dictations but occasionally words are mis-transcribed.)     Deanna ArtisVeselin Willamae Demby, MD  03/22/16 1309       Deanna ArtisVeselin Aengus Sauceda, MD  03/26/16 [phone]

## 2016-03-22 NOTE — Discharge Instructions (Signed)
Torticollis: Care Instructions  Your Care Instructions  Torticollis is a severe tightness of the muscles on one side of the neck. The tight muscles can make the head turn to one side, lean to one side, or be pulled forward or backward. It is also called wryneck.  Your doctor asked questions about your health and examined you. You may also have had X-rays or other tests. If your doctor thinks another medical problem is causing your tight neck muscles, you may need more tests.  Torticollis usually gets better with home care. Your doctor may have you take medicine to relieve pain or relax your muscles. He or she may suggest exercise and physical therapy to help increase flexibility and relieve stress. Your doctor may also have you wear a special collar, called a cervical collar, for a day or two. The collar may help make your neck more comfortable.  Follow-up care is a key part of your treatment and safety. Be sure to make and go to all appointments, and call your doctor if you are having problems. It's also a good idea to know your test results and keep a list of the medicines you take.  How can you care for yourself at home?   Be safe with medicines. Read and follow all instructions on the label.   If the doctor gave you a prescription medicine for pain, take it as prescribed.   If you are not taking a prescription pain medicine, ask your doctor if you can take an over-the-counter medicine.   Try using a heating pad on a low or medium setting for 15 to 20 minutes every 2 or 3 hours. Try a warm shower in place of one session with the heating pad.   Try using an ice pack for 10 to 15 minutes every 2 to 3 hours. Put a thin cloth between the ice and your skin.   If your doctor recommends a cervical collar, wear it exactly as directed.  When should you call for help?  Call your doctor now or seek immediate medical care if:   You have new or worse numbness in your arms, buttocks, or legs.   You have new or  worse weakness in your arms or legs.   Your neck pain gets worse.   You lose bladder or bowel control.  Watch closely for changes in your health, and be sure to contact your doctor if:   You do not get better as expected.  Where can you learn more?  Go to [email or web address] and sign in to your MyChart account. Enter A938 in the Search Health Information box to learn more about "Torticollis: Care Instructions."     If you do not have an account, please click on the "Sign Up Now" link.  Current as of: September 20, 2015  Content Version: 11.3   2006-2017 [employer]. Care instructions adapted under license by [facility]. If you have questions about a medical condition or this instruction, always ask your healthcare professional. [employer] disclaims any warranty or liability for your use of this information.

## 2016-03-22 NOTE — ED Triage Notes (Signed)
Medication list verified with verbal recall from patient.

## 2016-03-23 ENCOUNTER — Encounter

## 2016-03-23 ENCOUNTER — Inpatient Hospital Stay: Payer: PRIVATE HEALTH INSURANCE | Primary: Internal Medicine

## 2016-03-23 ENCOUNTER — Inpatient Hospital Stay: Admit: 2016-03-23 | Payer: PRIVATE HEALTH INSURANCE | Primary: Internal Medicine

## 2016-03-23 DIAGNOSIS — F17200 Nicotine dependence, unspecified, uncomplicated: Secondary | ICD-10-CM

## 2016-04-26 DIAGNOSIS — B353 Tinea pedis: Secondary | ICD-10-CM | POA: Diagnosis not present

## 2016-04-26 DIAGNOSIS — M79671 Pain in right foot: Secondary | ICD-10-CM | POA: Diagnosis not present

## 2016-04-26 DIAGNOSIS — M79672 Pain in left foot: Secondary | ICD-10-CM | POA: Diagnosis not present

## 2016-05-15 DIAGNOSIS — B353 Tinea pedis: Secondary | ICD-10-CM | POA: Diagnosis not present

## 2016-05-15 DIAGNOSIS — M545 Low back pain: Secondary | ICD-10-CM | POA: Diagnosis not present

## 2016-05-21 ENCOUNTER — Ambulatory Visit (INDEPENDENT_AMBULATORY_CARE_PROVIDER_SITE_OTHER): Payer: Medicare Other

## 2016-05-21 ENCOUNTER — Encounter: Payer: Self-pay | Admitting: Orthopedic Surgery

## 2016-05-21 ENCOUNTER — Ambulatory Visit (INDEPENDENT_AMBULATORY_CARE_PROVIDER_SITE_OTHER): Payer: Medicare Other | Admitting: Orthopedic Surgery

## 2016-05-21 DIAGNOSIS — M545 Low back pain: Secondary | ICD-10-CM

## 2016-05-21 DIAGNOSIS — M1710 Unilateral primary osteoarthritis, unspecified knee: Secondary | ICD-10-CM

## 2016-05-21 DIAGNOSIS — G8929 Other chronic pain: Secondary | ICD-10-CM | POA: Diagnosis not present

## 2016-05-21 MED ORDER — HYDROCODONE-ACETAMINOPHEN 7.5-325 MG PO TABS: 1.0000 | ORAL_TABLET | Freq: Four times a day (QID) | ORAL | 0 refills | Status: DC | PRN

## 2016-05-21 MED ORDER — PREDNISONE 10 MG (48) PO TBPK: ORAL_TABLET | ORAL | 0 refills | Status: DC
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
## 2016-06-07 ENCOUNTER — Telehealth: Payer: Self-pay | Admitting: Orthopedic Surgery

## 2016-06-07 ENCOUNTER — Ambulatory Visit (HOSPITAL_COMMUNITY): Payer: Medicare Other | Attending: Orthopedic Surgery

## 2016-06-07 ENCOUNTER — Encounter (HOSPITAL_COMMUNITY): Payer: Self-pay

## 2016-06-07 DIAGNOSIS — B353 Tinea pedis: Secondary | ICD-10-CM | POA: Diagnosis not present

## 2016-06-07 DIAGNOSIS — M6281 Muscle weakness (generalized): Secondary | ICD-10-CM | POA: Diagnosis not present

## 2016-06-07 DIAGNOSIS — M1710 Unilateral primary osteoarthritis, unspecified knee: Secondary | ICD-10-CM

## 2016-06-07 DIAGNOSIS — R293 Abnormal posture: Secondary | ICD-10-CM | POA: Diagnosis not present

## 2016-06-07 DIAGNOSIS — M79671 Pain in right foot: Secondary | ICD-10-CM | POA: Diagnosis not present

## 2016-06-07 DIAGNOSIS — M79672 Pain in left foot: Secondary | ICD-10-CM | POA: Diagnosis not present

## 2016-06-07 DIAGNOSIS — G8929 Other chronic pain: Secondary | ICD-10-CM | POA: Diagnosis not present

## 2016-06-07 DIAGNOSIS — M545 Low back pain: Secondary | ICD-10-CM | POA: Insufficient documentation

## 2016-06-07 NOTE — Telephone Encounter (Signed)
Patient called for refill  HYDROcodone-acetaminophen (NORCO) 7.5-325 MG tablet 56 tablet
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
## 2016-06-08 NOTE — Telephone Encounter (Signed)
Routing to Dr Harrison for approval 

## 2016-06-11 ENCOUNTER — Telehealth: Payer: Self-pay | Admitting: Orthopedic Surgery

## 2016-06-11 ENCOUNTER — Ambulatory Visit (HOSPITAL_COMMUNITY): Payer: Medicare Other | Admitting: Physical Therapy

## 2016-06-11 ENCOUNTER — Telehealth (HOSPITAL_COMMUNITY): Payer: Self-pay | Admitting: Physical Therapy

## 2016-06-11 MED ORDER — HYDROCODONE-ACETAMINOPHEN 7.5-325 MG PO TABS: 1.0000 | ORAL_TABLET | Freq: Four times a day (QID) | ORAL | 0 refills | Status: DC | PRN

## 2016-06-11 NOTE — Telephone Encounter (Signed)
Patient requests a refill Prednisone 10 mgs.  Qty  48

## 2016-06-11 NOTE — Telephone Encounter (Signed)
Pt did not show today.  Called and spoke to patient who asked, "what am I coming there for" I responded "your back" and he said "oh yea, I forgot".  Reminded of next appointment on Wednesday. Teena Irani, PTA/CLT [phone]

## 2016-06-11 NOTE — Telephone Encounter (Signed)
Prescription is done and up front

## 2016-06-13 ENCOUNTER — Telehealth (HOSPITAL_COMMUNITY): Payer: Self-pay | Admitting: Family Medicine

## 2016-06-13 NOTE — Telephone Encounter (Signed)
06/13/16 I offered him an appt on 12/14 since he was on our waiting list and he said that he just wanted to wait until next week

## 2016-06-20 ENCOUNTER — Ambulatory Visit (HOSPITAL_COMMUNITY): Payer: Medicare Other

## 2016-06-20 DIAGNOSIS — M6281 Muscle weakness (generalized): Secondary | ICD-10-CM | POA: Diagnosis not present

## 2016-06-20 DIAGNOSIS — M545 Low back pain, unspecified: Secondary | ICD-10-CM

## 2016-06-20 DIAGNOSIS — G8929 Other chronic pain: Secondary | ICD-10-CM

## 2016-06-20 DIAGNOSIS — R293 Abnormal posture: Secondary | ICD-10-CM
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
## 2016-06-29 ENCOUNTER — Telehealth: Payer: Self-pay | Admitting: *Deleted

## 2016-06-29 DIAGNOSIS — M1710 Unilateral primary osteoarthritis, unspecified knee: Secondary | ICD-10-CM

## 2016-06-29 NOTE — Telephone Encounter (Signed)
Request refill on Hydrocodone 7.5/325

## 2016-07-03 ENCOUNTER — Ambulatory Visit (HOSPITAL_COMMUNITY): Payer: Medicare Other | Attending: Orthopedic Surgery

## 2016-07-03 DIAGNOSIS — R293 Abnormal posture: Secondary | ICD-10-CM | POA: Diagnosis not present

## 2016-07-03 DIAGNOSIS — M545 Low back pain: Secondary | ICD-10-CM | POA: Diagnosis not present

## 2016-07-03 DIAGNOSIS — G8929 Other chronic pain: Secondary | ICD-10-CM | POA: Diagnosis not present

## 2016-07-03 DIAGNOSIS — M6281 Muscle weakness (generalized): Secondary | ICD-10-CM | POA: Diagnosis not present

## 2016-07-03 MED ORDER — HYDROCODONE-ACETAMINOPHEN 7.5-325 MG PO TABS: 1.0000 | ORAL_TABLET | Freq: Four times a day (QID) | ORAL | 0 refills | Status: DC | PRN
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
## 2016-07-04 ENCOUNTER — Encounter: Payer: Self-pay | Admitting: Orthopedic Surgery

## 2016-07-04 ENCOUNTER — Ambulatory Visit (INDEPENDENT_AMBULATORY_CARE_PROVIDER_SITE_OTHER): Payer: Medicare Other | Admitting: Orthopedic Surgery

## 2016-07-04 ENCOUNTER — Ambulatory Visit (INDEPENDENT_AMBULATORY_CARE_PROVIDER_SITE_OTHER): Payer: Medicare Other

## 2016-07-04 DIAGNOSIS — M545 Low back pain: Secondary | ICD-10-CM

## 2016-07-04 DIAGNOSIS — M7551 Bursitis of right shoulder: Secondary | ICD-10-CM | POA: Diagnosis not present

## 2016-07-04 DIAGNOSIS — M1712 Unilateral primary osteoarthritis, left knee: Secondary | ICD-10-CM | POA: Diagnosis not present

## 2016-07-04 DIAGNOSIS — M7552 Bursitis of left shoulder: Secondary | ICD-10-CM

## 2016-07-04 DIAGNOSIS — M1710 Unilateral primary osteoarthritis, unspecified knee: Secondary | ICD-10-CM

## 2016-07-04 DIAGNOSIS — G8929 Other chronic pain: Secondary | ICD-10-CM

## 2016-07-04 MED ORDER — HYDROCODONE-ACETAMINOPHEN 7.5-325 MG PO TABS: 1.0000 | ORAL_TABLET | Freq: Four times a day (QID) | ORAL | 0 refills | Status: DC | PRN

## 2016-07-04 MED ORDER — IBUPROFEN 800 MG PO TABS: 800.0000 mg | ORAL_TABLET | Freq: Three times a day (TID) | ORAL | 5 refills | Status: DC

## 2016-07-04 MED ORDER — PREDNISONE 10 MG (48) PO TBPK: ORAL_TABLET | ORAL | 0 refills | Status: DC

## 2016-07-04 NOTE — Progress Notes (Signed)
Patient ID: Terry Case, male   DOB: Jun 05, 1966, 51 y.o.   MRN: [id number]  Chief Complaint  Patient presents with  . Follow-up    BACK PAIN    HPI Terry Case is a 51 y.o. male.   HPI  Encounter Diagnoses  Name Primary?  Marland Kitchen Arthritis of knee   . Chronic low back pain, unspecified back pain laterality, with sciatica presence unspecified Yes  . Primary osteoarthritis of left knee   . Bilateral shoulder bursitis     51 year old male comes in for recheck on his lower back and chronic osteoarthritis left knee.  He also complains of bilateral shoulder pain at night without any loss of motion or weakness. No history of trauma to either shoulder.  He did note some improvement with prednisone Dosepak for his shoulder and back  He's maintained on hydrocodone and ibuprofen for his knee  Review of Systems Review of Systems  Constitutional: Positive for unexpected weight change. Negative for activity change and appetite change.       Gained weight   Gastrointestinal: Negative.   Genitourinary: Negative.   Musculoskeletal: Positive for back pain.  Neurological: Negative for numbness.    Physical Exam  Appearance is normal his body habitus is endomorphic. He is oriented 3 His mood is pleasant his affect is upbeat  His gait is normal with some alteration in his lumbar posture  He has normal range of motion both shoulders no weakness mild tenderness in the peri-acromial region both are stable strength tests are normal skin is intact pulse normal and sensation is normal  Left knee exam shows tenderness medial compartment no swelling flexion arc 120 knee stable strength normal skin intact distal pulses normal  Tenderness in his lower back right and left side   MEDICAL DECISION MAKING  DATA   Lumbar spine films today Again show lumbar spondylosis  Prior lumbar spine films in 2010. X-ray report  reviewed and I do see that he has degenerative disc disease L5-S1 and  some and L4-5. L3 and 4 also showed disc space narrowing  DIAGNOSIS  Encounter Diagnoses  Name Primary?  Marland Kitchen Arthritis of knee   . Chronic low back pain, unspecified back pain laterality, with sciatica presence unspecified Yes  . Primary osteoarthritis of left knee   . Bilateral shoulder bursitis     Meds ordered this encounter  Medications  . HYDROcodone-acetaminophen (NORCO) 7.5-325 MG tablet    Sig: Take 1 tablet by mouth every 6 (six) hours as needed for moderate pain.    Dispense:  28 tablet    Refill:  0  . predniSONE (STERAPRED UNI-PAK 48 TAB) 10 MG (48) TBPK tablet    Sig: Use as directed    Dispense:  48 tablet    Refill:  0  . ibuprofen (ADVIL,MOTRIN) 800 MG tablet    Sig: Take 1 tablet (800 mg total) by mouth 3 (three) times daily.    Dispense:  90 tablet    Refill:  5     PLAN(RISK)    Follow-up in 3 months

## 2016-07-04 NOTE — Addendum Note (Signed)
Addended by: Baldomero Lamy B on: 07/04/2016 11:22 AM   Modules accepted: Orders

## 2016-07-05 ENCOUNTER — Ambulatory Visit (HOSPITAL_COMMUNITY): Payer: Medicare Other | Admitting: Physical Therapy

## 2016-07-05 DIAGNOSIS — G8929 Other chronic pain: Secondary | ICD-10-CM

## 2016-07-05 DIAGNOSIS — M6281 Muscle weakness (generalized): Secondary | ICD-10-CM

## 2016-07-05 DIAGNOSIS — M545 Low back pain, unspecified: Secondary | ICD-10-CM

## 2016-07-05 DIAGNOSIS — R293 Abnormal posture: Secondary | ICD-10-CM
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
## 2016-07-10 ENCOUNTER — Ambulatory Visit (HOSPITAL_COMMUNITY): Payer: Medicare Other | Admitting: Physical Therapy

## 2016-07-12 ENCOUNTER — Ambulatory Visit (HOSPITAL_COMMUNITY): Payer: Medicare Other

## 2016-07-17 ENCOUNTER — Encounter (HOSPITAL_COMMUNITY): Payer: Medicare Other | Admitting: Physical Therapy

## 2016-07-19 ENCOUNTER — Encounter (HOSPITAL_COMMUNITY): Payer: Medicare Other

## 2016-07-24 ENCOUNTER — Encounter (HOSPITAL_COMMUNITY): Payer: Medicare Other

## 2016-07-25 DIAGNOSIS — J069 Acute upper respiratory infection, unspecified: Secondary | ICD-10-CM | POA: Diagnosis not present

## 2016-07-25 DIAGNOSIS — N3281 Overactive bladder: Secondary | ICD-10-CM | POA: Diagnosis not present

## 2016-07-25 DIAGNOSIS — Z79899 Other long term (current) drug therapy: Secondary | ICD-10-CM | POA: Diagnosis not present

## 2016-07-26 ENCOUNTER — Encounter (HOSPITAL_COMMUNITY): Payer: Medicare Other

## 2016-07-27 ENCOUNTER — Encounter (HOSPITAL_COMMUNITY): Payer: Self-pay | Admitting: Emergency Medicine

## 2016-07-27 ENCOUNTER — Emergency Department (HOSPITAL_COMMUNITY)
Admission: EM | Admit: 2016-07-27 | Discharge: 2016-07-27 | Disposition: A | Discharge status: Home / Self Care | Payer: Medicare Other | Attending: Emergency Medicine | Admitting: Emergency Medicine

## 2016-07-27 ENCOUNTER — Emergency Department (HOSPITAL_COMMUNITY): Payer: Medicare Other

## 2016-07-27 DIAGNOSIS — Z7982 Long term (current) use of aspirin: Secondary | ICD-10-CM | POA: Diagnosis not present

## 2016-07-27 DIAGNOSIS — Z23 Encounter for immunization: Secondary | ICD-10-CM | POA: Insufficient documentation

## 2016-07-27 DIAGNOSIS — Y999 Unspecified external cause status: Secondary | ICD-10-CM | POA: Insufficient documentation

## 2016-07-27 DIAGNOSIS — Z87891 Personal history of nicotine dependence: Secondary | ICD-10-CM | POA: Diagnosis not present

## 2016-07-27 DIAGNOSIS — Y92009 Unspecified place in unspecified non-institutional (private) residence as the place of occurrence of the external cause: Secondary | ICD-10-CM | POA: Insufficient documentation

## 2016-07-27 DIAGNOSIS — J40 Bronchitis, not specified as acute or chronic: Secondary | ICD-10-CM | POA: Insufficient documentation

## 2016-07-27 DIAGNOSIS — J45909 Unspecified asthma, uncomplicated: Secondary | ICD-10-CM | POA: Diagnosis not present

## 2016-07-27 DIAGNOSIS — S3135XA Open bite of scrotum and testes, initial encounter: Secondary | ICD-10-CM | POA: Diagnosis not present

## 2016-07-27 DIAGNOSIS — Z791 Long term (current) use of non-steroidal anti-inflammatories (NSAID): Secondary | ICD-10-CM | POA: Diagnosis not present

## 2016-07-27 DIAGNOSIS — W540XXA Bitten by dog, initial encounter: Secondary | ICD-10-CM | POA: Diagnosis not present

## 2016-07-27 DIAGNOSIS — S3133XA Puncture wound without foreign body of scrotum and testes, initial encounter: Secondary | ICD-10-CM | POA: Insufficient documentation

## 2016-07-27 DIAGNOSIS — Y939 Activity, unspecified: Secondary | ICD-10-CM | POA: Diagnosis not present

## 2016-07-27 DIAGNOSIS — R05 Cough: Secondary | ICD-10-CM | POA: Diagnosis not present

## 2016-07-27 MED ORDER — DEXAMETHASONE 4 MG PO TABS: 4.0000 mg | ORAL_TABLET | Freq: Two times a day (BID) | ORAL | 0 refills | Status: DC

## 2016-07-27 MED ORDER — AMOXICILLIN-POT CLAVULANATE 875-125 MG PO TABS
1.0000 | ORAL_TABLET | Freq: Once | ORAL | Status: AC
  Administered 2016-07-27: 1 via ORAL
  Filled 2016-07-27: qty 1

## 2016-07-27 MED ORDER — HYDROCODONE-HOMATROPINE 5-1.5 MG/5ML PO SYRP: 5.0000 mL | ORAL_SOLUTION | Freq: Four times a day (QID) | ORAL | 0 refills | Status: DC | PRN

## 2016-07-27 MED ORDER — ALBUTEROL SULFATE HFA 108 (90 BASE) MCG/ACT IN AERS
2.0000 | INHALATION_SPRAY | Freq: Once | RESPIRATORY_TRACT | Status: AC
  Administered 2016-07-27: 2 via RESPIRATORY_TRACT
  Filled 2016-07-27: qty 6.7

## 2016-07-27 MED ORDER — PREDNISONE 20 MG PO TABS
40.0000 mg | ORAL_TABLET | Freq: Once | ORAL | Status: AC
  Administered 2016-07-27: 40 mg via ORAL
  Filled 2016-07-27: qty 2

## 2016-07-27 MED ORDER — TETANUS-DIPHTH-ACELL PERTUSSIS 5-2.5-18.5 LF-MCG/0.5 IM SUSP
0.5000 mL | Freq: Once | INTRAMUSCULAR | Status: AC
  Administered 2016-07-27: 0.5 mL via INTRAMUSCULAR
  Filled 2016-07-27: qty 0.5

## 2016-07-27 MED ORDER — ACETAMINOPHEN 325 MG PO TABS
ORAL_TABLET | ORAL | Status: AC
  Filled 2016-07-27: qty 2

## 2016-07-27 MED ORDER — AMOXICILLIN-POT CLAVULANATE 875-125 MG PO TABS: 1.0000 | ORAL_TABLET | Freq: Two times a day (BID) | ORAL | 0 refills | Status: DC

## 2016-07-27 MED ORDER — ACETAMINOPHEN 325 MG PO TABS
650.0000 mg | ORAL_TABLET | Freq: Once | ORAL | Status: AC
  Administered 2016-07-27: 650 mg via ORAL

## 2016-07-27 MED ORDER — HYDROCOD POLST-CPM POLST ER 10-8 MG/5ML PO SUER
5.0000 mL | Freq: Once | ORAL | Status: AC
  Administered 2016-07-27: 5 mL via ORAL
  Filled 2016-07-27: qty 5

## 2016-07-27 NOTE — Discharge Instructions (Signed)
Your tetanus status has been updated. Please update your medical records to indicate that she received a tetanus shot today. Please cleanse the dog bite wound with soap and water daily. Apply a bandage on until the wound has healed. Please use Augmentin 2 times daily with food. This medication may cause loose stools.  Your chest x-ray is negative for pneumonia or acute problems. Your examination suggest bronchitis. Please use Decadron 2 times daily. Use your albuterol inhaler every 4 hours until symptoms have resolved. Use Hycodan for cough. This medication may cause drowsiness, please use with caution. Please do not drive, drink alcohol, operate machinery, or participate in activities requiring concentration when taking this medication. Please see Dr.Hill for additional evaluation and management if not improving.

## 2016-07-27 NOTE — ED Triage Notes (Signed)
Pt comes in with 2 problems.   Pt has been having cough and congestion for 4 days. Pt was seen at PCPs office yesterday and told to take allergy medication.   Pt was at his neighbors house and states her little dog jumped up and bit him in his testicles. Pt looks to have a skin tear on his left testicle. Bleeding controlled.

## 2016-07-27 NOTE — ED Notes (Signed)
Plan to call animal control but pt does not know owners name or address.  Pt does state that owner says small dog was vaccinated.
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
## 2016-07-31 ENCOUNTER — Telehealth: Payer: Self-pay | Admitting: Orthopedic Surgery

## 2016-07-31 ENCOUNTER — Encounter (HOSPITAL_COMMUNITY): Payer: Medicare Other

## 2016-07-31 NOTE — Telephone Encounter (Signed)
Hydrocodone-Acetaminophen  7.5/325mg   Qty 28 Tablets  Take 1 tablet by mouth every 6 (six) hours as needed for moderate pain.

## 2016-07-31 NOTE — Telephone Encounter (Signed)
Routing to Dr Harrison for approval 

## 2016-08-01 DIAGNOSIS — R05 Cough: Secondary | ICD-10-CM | POA: Diagnosis not present

## 2016-08-02 ENCOUNTER — Encounter (HOSPITAL_COMMUNITY): Payer: Medicare Other

## 2016-08-03 ENCOUNTER — Other Ambulatory Visit: Payer: Self-pay | Admitting: Orthopedic Surgery

## 2016-08-03 DIAGNOSIS — M1710 Unilateral primary osteoarthritis, unspecified knee: Secondary | ICD-10-CM

## 2016-08-03 MED ORDER — HYDROCODONE-ACETAMINOPHEN 7.5-325 MG PO TABS: 1.0000 | ORAL_TABLET | Freq: Four times a day (QID) | ORAL | 0 refills | Status: DC | PRN

## 2016-08-03 NOTE — Progress Notes (Signed)
[place] controlled substance reporting system reviewed  

## 2016-08-13 DIAGNOSIS — E118 Type 2 diabetes mellitus with unspecified complications: Secondary | ICD-10-CM | POA: Diagnosis not present

## 2016-08-13 DIAGNOSIS — Z23 Encounter for immunization: Secondary | ICD-10-CM | POA: Diagnosis not present

## 2016-08-13 DIAGNOSIS — N3281 Overactive bladder: Secondary | ICD-10-CM | POA: Diagnosis not present

## 2016-08-13 DIAGNOSIS — J069 Acute upper respiratory infection, unspecified: Secondary | ICD-10-CM | POA: Diagnosis not present

## 2016-08-13 DIAGNOSIS — Z0000 Encounter for general adult medical examination without abnormal findings: Secondary | ICD-10-CM | POA: Diagnosis not present

## 2016-08-13 DIAGNOSIS — Z79899 Other long term (current) drug therapy: Secondary | ICD-10-CM | POA: Diagnosis not present

## 2016-08-21 ENCOUNTER — Telehealth: Payer: Self-pay | Admitting: Orthopedic Surgery

## 2016-08-21 DIAGNOSIS — M1710 Unilateral primary osteoarthritis, unspecified knee: Secondary | ICD-10-CM

## 2016-08-21 DIAGNOSIS — E118 Type 2 diabetes mellitus with unspecified complications: Secondary | ICD-10-CM | POA: Diagnosis not present

## 2016-08-21 MED ORDER — HYDROCODONE-ACETAMINOPHEN 7.5-325 MG PO TABS: 1.0000 | ORAL_TABLET | Freq: Four times a day (QID) | ORAL | 0 refills | Status: DC | PRN

## 2016-08-21 NOTE — Telephone Encounter (Signed)
Hydrocodone-Acetaminophen  7.5/325mg   Qty 28 Tablets  Take 1 tablet by mouth every 6 (six) hours as needed for moderate pain.

## 2016-08-21 NOTE — Telephone Encounter (Signed)
Routing to dr harrison 

## 2016-08-21 NOTE — Telephone Encounter (Signed)
[place] controlled substance reporting system reviewed  

## 2016-08-23 ENCOUNTER — Ambulatory Visit (INDEPENDENT_AMBULATORY_CARE_PROVIDER_SITE_OTHER): Payer: Medicare Other | Admitting: Internal Medicine

## 2016-08-23 ENCOUNTER — Encounter: Payer: Self-pay | Admitting: Internal Medicine

## 2016-08-23 VITALS — BP 150/90 | HR 74 | Ht 68.0 in | Wt 277.0 lb

## 2016-08-23 DIAGNOSIS — J449 Chronic obstructive pulmonary disease, unspecified: Secondary | ICD-10-CM | POA: Insufficient documentation

## 2016-08-23 DIAGNOSIS — I10 Essential (primary) hypertension: Secondary | ICD-10-CM | POA: Diagnosis not present

## 2016-08-23 MED ORDER — ALBUTEROL SULFATE HFA 108 (90 BASE) MCG/ACT IN AERS: INHALATION_SPRAY | RESPIRATORY_TRACT | 2 refills | Status: DC

## 2016-08-23 NOTE — Assessment & Plan Note (Signed)
Advised avoid na/ caffeine/ alcohol otc decongestants and nsaids >Follow up per Primary Care planned  - would avoid acei in pt with pattern of recurrent cough

## 2016-08-23 NOTE — Patient Instructions (Signed)
Only use your albuterol (proair (RED) or proventil (YELLOW) as a rescue medication to be used if you can't catch your breath by resting or doing a relaxed purse lip breathing pattern.  - The less you use it, the better it will work when you need it. - Ok to use up to 2 puffs  every 4 hours if you must but call for immediate appointment if use goes up over your usual need - Don't leave home without it !!  (think of it like the spare tire for your car)   Pulmonary follow up is as needed

## 2016-08-23 NOTE — Assessment & Plan Note (Signed)
Complicated by hbp/OSA   Body mass index is 42.12 kg/m.  trending down/ encouraged No results found for: TSH   Contributing to gerd risk/ doe/reviewed the need and the process to achieve and maintain neg calorie balance > defer f/u primary care including intermittently monitoring thyroid status

## 2016-08-23 NOTE — Progress Notes (Signed)
Subjective:     Patient ID: Terry Case, male   DOB: [birth year],    MRN: [id number]  HPI  56 yobm quit smoking 08/2011 with chest congestion/ cough and resolved with no cough and good activity tol though no aerobics referred to pulmonary clinic 08/23/2016 by Dr   Iona Beard    08/23/2016 1st [facility] office visit/ [facility]    Chief Complaint  Patient presents with  . Pulmonary Consult    Referred by Dr. Mercie Eon. Pt states that he had been coughing for about 2 1/2 wks, but this has already and no co's today.   variable cough since quit smoking better with saba hasn't used in sev weeks    Able to do yardwork, steps s sob now    No obvious day to day or daytime variability or assoc excess/ purulent sputum or mucus plugs or hemoptysis or cp or chest tightness, subjective wheeze or overt sinus or hb symptoms. No unusual exp hx or h/o childhood pna/ asthma or knowledge of premature birth.  Sleeping ok without nocturnal  or early am exacerbation  of respiratory  c/o's or need for noct saba. Also denies any obvious fluctuation of symptoms with weather or environmental changes or other aggravating or alleviating factors except as outlined above   Current Medications, Allergies, Complete Past Medical History, Past Surgical History, Family History, and Social History were reviewed in [employer] record.  ROS  The following are not active complaints unless bolded sore throat, dysphagia, dental problems, itching, sneezing,  nasal congestion or excess/ purulent secretions, ear ache,   fever, chills, sweats, unintended wt loss, classically pleuritic or exertional cp,  orthopnea pnd or leg swelling, presyncope, palpitations, abdominal pain, anorexia, nausea, vomiting, diarrhea  or change in bowel or bladder habits, change in stools or urine, dysuria,hematuria,  rash, arthralgias, visual complaints, headache, numbness, weakness or ataxia or problems with walking or  coordination,  change in mood/affect or memory.           Review of Systems     Objective:   Physical Exam    amb mod obese bm nad   Wt Readings from Last 3 Encounters:  08/23/16 277 lb (125.6 kg)  07/27/16 287 lb (130.2 kg)  11/16/15 270 lb (122.5 kg)    Vital signs reviewed  - Note on arrival 02 sats  97% on RA      HEENT: nl dentition, turbinates bilaterally, and oropharynx. Nl external ear canals without cough reflex   NECK :  without JVD/Nodes/TM/ nl carotid upstrokes bilaterally   LUNGS: no acc muscle use,  Nl contour chest which is clear to A and P bilaterally without cough on insp or exp maneuvers   CV:  RRR  no s3 or murmur or increase in P2, and no edema   ABD:  soft and nontender with nl inspiratory excursion in the supine position. No bruits or organomegaly appreciated, bowel sounds nl  MS:  Nl gait/ ext warm without deformities, calf tenderness, cyanosis or clubbing No obvious joint restrictions   SKIN: warm and dry without lesions    NEURO:  alert, approp, nl sensorium with  no motor or cerebellar deficits apparent.       I personally reviewed images and agree with radiology impression as follows:  CXR:   07/27/16 No active cardiopulmonary disease.  Assessment:

## 2016-08-23 NOTE — Assessment & Plan Note (Signed)
08/23/2016  After extensive coaching HFA effectiveness =    75% from baseline 25% - Spirometry 08/23/2016  Very minimal obst on f/v curve > pulmonary f/u prn   He may be prone to AB but really has no residual copd from smoking at this point    I reviewed the Fletcher curve with the patient that basically indicates  if you quit smoking when your best day FEV1 is still well preserved (as is clearly  the case here)  it is highly unlikely you will progress to severe disease and informed the patient there was  no medication on the market that has proven to alter the curve/ its downward trajectory  or the likelihood of progression of their disease(unlike other chronic medical conditions such as atheroclerosis where we do think we can change the natural hx with risk reducing meds)    Therefore stopping smoking and maintaining abstinence is the most important aspect of care, not choice of inhalers or for that matter, doctors.     Pulmonary f/u is prn  Total time devoted to counseling  > 50 % of initial 60 min office visit:  review case with pt/ discussion of options/alternatives/ personally creating written customized instructions  in presence of pt  then going over those specific  Instructions directly with the pt including how to use all of the meds but in particular covering each new medication in detail and the difference between the maintenance= "automatic" meds and the prns using an action plan format for the latter (If this problem/symptom => do that organization reading Left to right).  Please see AVS from this visit for a full list of these instructions which I personally wrote for this pt and  are unique to this visit.

## 2016-09-04 ENCOUNTER — Telehealth: Payer: Self-pay | Admitting: Orthopedic Surgery

## 2016-09-04 NOTE — Telephone Encounter (Signed)
Routing to Dr Harrison for approval 

## 2016-09-04 NOTE — Telephone Encounter (Signed)
Patient requesting pain Rx Hydrocodone and Prednisone.  Patient aware that Dr. Aline Brochure will not be in the office until the 12th of March.

## 2016-09-10 ENCOUNTER — Other Ambulatory Visit: Payer: Self-pay | Admitting: Orthopedic Surgery

## 2016-09-10 DIAGNOSIS — M1710 Unilateral primary osteoarthritis, unspecified knee: Secondary | ICD-10-CM

## 2016-09-10 MED ORDER — HYDROCODONE-ACETAMINOPHEN 7.5-325 MG PO TABS: 1.0000 | ORAL_TABLET | Freq: Four times a day (QID) | ORAL | 0 refills | Status: DC | PRN

## 2016-09-10 NOTE — Progress Notes (Signed)
30 days off prednisone

## 2016-09-23 ENCOUNTER — Encounter (HOSPITAL_COMMUNITY): Payer: Self-pay | Admitting: Emergency Medicine

## 2016-09-23 ENCOUNTER — Emergency Department (HOSPITAL_COMMUNITY)
Admission: EM | Admit: 2016-09-23 | Discharge: 2016-09-23 | Disposition: A | Discharge status: Home / Self Care | Payer: Medicare Other | Attending: Emergency Medicine | Admitting: Emergency Medicine

## 2016-09-23 ENCOUNTER — Emergency Department (HOSPITAL_COMMUNITY): Payer: Medicare Other

## 2016-09-23 DIAGNOSIS — E119 Type 2 diabetes mellitus without complications: Secondary | ICD-10-CM | POA: Insufficient documentation

## 2016-09-23 DIAGNOSIS — Z79899 Other long term (current) drug therapy: Secondary | ICD-10-CM | POA: Insufficient documentation

## 2016-09-23 DIAGNOSIS — Z87891 Personal history of nicotine dependence: Secondary | ICD-10-CM | POA: Diagnosis not present

## 2016-09-23 DIAGNOSIS — I10 Essential (primary) hypertension: Secondary | ICD-10-CM | POA: Insufficient documentation

## 2016-09-23 DIAGNOSIS — J0100 Acute maxillary sinusitis, unspecified: Secondary | ICD-10-CM | POA: Diagnosis not present

## 2016-09-23 DIAGNOSIS — G4489 Other headache syndrome: Secondary | ICD-10-CM | POA: Diagnosis not present

## 2016-09-23 DIAGNOSIS — J45909 Unspecified asthma, uncomplicated: Secondary | ICD-10-CM | POA: Insufficient documentation

## 2016-09-23 DIAGNOSIS — Z7984 Long term (current) use of oral hypoglycemic drugs: Secondary | ICD-10-CM | POA: Diagnosis not present

## 2016-09-23 DIAGNOSIS — Z7982 Long term (current) use of aspirin: Secondary | ICD-10-CM | POA: Diagnosis not present

## 2016-09-23 DIAGNOSIS — R51 Headache: Secondary | ICD-10-CM | POA: Diagnosis not present

## 2016-09-23 HISTORY — DX: Type 2 diabetes mellitus without complications: E11.9

## 2016-09-23 LAB — CBC WITH DIFFERENTIAL/PLATELET
BASOS PCT: 1 %
Basophils Absolute: 0 10*3/uL (ref 0.0–0.1)
Eosinophils Absolute: 0.2 10*3/uL (ref 0.0–0.7)
Eosinophils Relative: 4 %
HCT: 39.5 % (ref 39.0–52.0)
Hemoglobin: 12.9 g/dL — ABNORMAL LOW (ref 13.0–17.0)
LYMPHS ABS: 1.3 10*3/uL (ref 0.7–4.0)
Lymphocytes Relative: 33 %
MCH: 27.6 pg (ref 26.0–34.0)
MCHC: 32.7 g/dL (ref 30.0–36.0)
MCV: 84.4 fL (ref 78.0–100.0)
MONO ABS: 0.3 10*3/uL (ref 0.1–1.0)
MONOS PCT: 8 %
Neutro Abs: 2.2 10*3/uL (ref 1.7–7.7)
Neutrophils Relative %: 54 %
Platelets: 220 10*3/uL (ref 150–400)
RBC: 4.68 MIL/uL (ref 4.22–5.81)
RDW: 13.9 % (ref 11.5–15.5)
WBC: 4.1 10*3/uL (ref 4.0–10.5)

## 2016-09-23 LAB — COMPREHENSIVE METABOLIC PANEL
ALBUMIN: 4.1 g/dL (ref 3.5–5.0)
ALT: 49 U/L (ref 17–63)
ANION GAP: 7 (ref 5–15)
AST: 42 U/L — ABNORMAL HIGH (ref 15–41)
Alkaline Phosphatase: 63 U/L (ref 38–126)
BILIRUBIN TOTAL: 0.5 mg/dL (ref 0.3–1.2)
BUN: 18 mg/dL (ref 6–20)
CO2: 24 mmol/L (ref 22–32)
Calcium: 9.2 mg/dL (ref 8.9–10.3)
Chloride: 106 mmol/L (ref 101–111)
Creatinine, Ser: 0.96 mg/dL (ref 0.61–1.24)
GLUCOSE: 100 mg/dL — AB (ref 65–99)
POTASSIUM: 4.3 mmol/L (ref 3.5–5.1)
Sodium: 137 mmol/L (ref 135–145)
TOTAL PROTEIN: 6.9 g/dL (ref 6.5–8.1)

## 2016-09-23 LAB — TROPONIN I

## 2016-09-23 MED ORDER — KETOROLAC TROMETHAMINE 30 MG/ML IJ SOLN
15.0000 mg | Freq: Once | INTRAMUSCULAR | Status: AC
  Administered 2016-09-23: 15 mg via INTRAVENOUS
  Filled 2016-09-23: qty 1

## 2016-09-23 MED ORDER — SODIUM CHLORIDE 0.9 % IV SOLN
INTRAVENOUS | Status: DC
  Administered 2016-09-23: 11:00:00 via INTRAVENOUS

## 2016-09-23 MED ORDER — ONDANSETRON HCL 4 MG/2ML IJ SOLN
4.0000 mg | Freq: Once | INTRAMUSCULAR | Status: AC
  Administered 2016-09-23: 4 mg via INTRAVENOUS
  Filled 2016-09-23: qty 2

## 2016-09-23 MED ORDER — AMOXICILLIN-POT CLAVULANATE 875-125 MG PO TABS: 1.0000 | ORAL_TABLET | Freq: Two times a day (BID) | ORAL | 0 refills | Status: DC

## 2016-09-23 MED ORDER — DIPHENHYDRAMINE HCL 50 MG/ML IJ SOLN
25.0000 mg | Freq: Once | INTRAMUSCULAR | Status: AC
  Administered 2016-09-23: 25 mg via INTRAVENOUS
  Filled 2016-09-23: qty 1

## 2016-09-23 MED ORDER — DEXAMETHASONE SODIUM PHOSPHATE 4 MG/ML IJ SOLN: 10.0000 mg | Freq: Once | INTRAMUSCULAR | Status: DC

## 2016-09-23 NOTE — Discharge Instructions (Signed)
Follow up with Dr. Berdine Addison for blood pressure check. Return here for worsening symptoms.

## 2016-09-23 NOTE — ED Triage Notes (Signed)
Patient c/o headache that started this morning. Per patient took ibuprofen this morning a5 am with no relief. Patient reports "heat in chest when he closes eyes and then the throbbing in back of head hurts." Worse with eyes closed. Per patient "heat" in chest resolving. Patient denies any nausea or neurological deficits.
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
## 2016-09-24 DIAGNOSIS — E118 Type 2 diabetes mellitus with unspecified complications: Secondary | ICD-10-CM | POA: Diagnosis not present

## 2016-09-27 ENCOUNTER — Telehealth: Payer: Self-pay | Admitting: Orthopedic Surgery

## 2016-09-27 NOTE — Telephone Encounter (Signed)
Routing to Dr Harrison for approval 

## 2016-09-27 NOTE — Telephone Encounter (Signed)
Hydrocodone/Acetaminophen 7.5-325 mgs.  Qty  28  Sig: Take 1 tablet by mouth every 6 (six) hours as needed for moderate pain.

## 2016-10-02 ENCOUNTER — Ambulatory Visit (INDEPENDENT_AMBULATORY_CARE_PROVIDER_SITE_OTHER): Payer: Medicare Other | Admitting: Orthopedic Surgery

## 2016-10-02 ENCOUNTER — Encounter: Payer: Self-pay | Admitting: Orthopedic Surgery

## 2016-10-02 DIAGNOSIS — M1710 Unilateral primary osteoarthritis, unspecified knee: Secondary | ICD-10-CM | POA: Diagnosis not present

## 2016-10-02 DIAGNOSIS — M170 Bilateral primary osteoarthritis of knee: Secondary | ICD-10-CM | POA: Diagnosis not present

## 2016-10-02 DIAGNOSIS — M1712 Unilateral primary osteoarthritis, left knee: Secondary | ICD-10-CM | POA: Diagnosis not present

## 2016-10-02 DIAGNOSIS — G8929 Other chronic pain: Secondary | ICD-10-CM | POA: Diagnosis not present

## 2016-10-02 DIAGNOSIS — M545 Low back pain: Secondary | ICD-10-CM | POA: Diagnosis not present

## 2016-10-02 DIAGNOSIS — M7551 Bursitis of right shoulder: Secondary | ICD-10-CM | POA: Diagnosis not present

## 2016-10-02 DIAGNOSIS — M7552 Bursitis of left shoulder: Secondary | ICD-10-CM

## 2016-10-02 MED ORDER — HYDROCODONE-ACETAMINOPHEN 7.5-325 MG PO TABS: 1.0000 | ORAL_TABLET | Freq: Four times a day (QID) | ORAL | 0 refills | Status: DC | PRN

## 2016-10-02 NOTE — Progress Notes (Signed)
FOLLOW UP VISIT   Patient ID: Terry Case, male   DOB: July 15, 1965, 51 y.o.   MRN: [id number]  Chief Complaint  Patient presents with  . Follow-up    BACK, SHOULDERS, KNEES    HPI Terry Case is a 51 y.o. male.   HPI  51 year old male with lower back pain, bilateral shoulder pain when he is lying down in bilateral knee pain when walking history of osteoarthritis of the knee  Currently on Norco 7.5 mg which controls his pain  He says his knees are aching when he is walking and is having pain when he lies down in both shoulders  Review of Systems Review of Systems   He denies any radicular symptoms or numbness and tingling in his legs  Physical Exam  He is awake alert and oriented 3 mood and affect are normal body habitus is mesomorphic to endomorphic. He has tenderness in both knees on both medial joint lines. He has flexion up to 115 he has 5 flexion contractures in each leg has no neurovascular deficits in the right or left leg   [place]    Encounter Diagnoses  Name Primary?  Marland Kitchen Arthritis of knee   . Chronic low back pain, unspecified back pain laterality, with sciatica presence unspecified Yes  . Primary osteoarthritis of left knee   . Bilateral shoulder bursitis   . Primary osteoarthritis of both knees      Procedure note left knee injection verbal consent was obtained to inject left knee joint  Timeout was completed to confirm the site of injection  The medications used were 40 mg of Depo-Medrol and 1% lidocaine 3 cc  Anesthesia was provided by ethyl chloride and the skin was prepped with alcohol.  After cleaning the skin with alcohol a 20-gauge needle was used to inject the left knee joint. There were no complications. A sterile bandage was applied.   Procedure note right knee injection verbal consent was obtained to inject right knee joint  Timeout was completed to confirm the site of injection  The medications used  were 40 mg of Depo-Medrol and 1% lidocaine 3 cc  Anesthesia was provided by ethyl chloride and the skin was prepped with alcohol.  After cleaning the skin with alcohol a 20-gauge needle was used to inject the right knee joint. There were no complications. A sterile bandage was applied.  SHOULDER EXERCISES   PLAN(RISK)  Meds ordered this encounter  Medications  . HYDROcodone-acetaminophen (NORCO) 7.5-325 MG tablet    Sig: Take 1 tablet by mouth every 6 (six) hours as needed for moderate pain.    Dispense:  60 tablet    Refill:  [place] controlled substance reporting system reviewed   3 MO FU

## 2016-10-02 NOTE — Patient Instructions (Signed)

## 2016-10-17 ENCOUNTER — Telehealth: Payer: Self-pay | Admitting: Orthopedic Surgery

## 2016-10-17 NOTE — Telephone Encounter (Signed)
Hydrocodone-Acetaminophen 7.5/325 mg  Qty  60 Tablets ° °Take 1 tablet by mouth every 6 (six) hours as needed for moderate pain. °

## 2016-10-17 NOTE — Telephone Encounter (Signed)
Routing to Dr Harrison for approval 

## 2016-10-19 ENCOUNTER — Other Ambulatory Visit: Payer: Self-pay | Admitting: Orthopedic Surgery

## 2016-10-19 DIAGNOSIS — M1710 Unilateral primary osteoarthritis, unspecified knee: Secondary | ICD-10-CM

## 2016-10-19 MED ORDER — HYDROCODONE-ACETAMINOPHEN 7.5-325 MG PO TABS: 1.0000 | ORAL_TABLET | Freq: Four times a day (QID) | ORAL | 0 refills | Status: DC | PRN

## 2016-11-05 ENCOUNTER — Other Ambulatory Visit: Payer: Self-pay | Admitting: Orthopedic Surgery

## 2016-11-05 ENCOUNTER — Telehealth: Payer: Self-pay | Admitting: Orthopedic Surgery

## 2016-11-05 DIAGNOSIS — M1710 Unilateral primary osteoarthritis, unspecified knee: Secondary | ICD-10-CM

## 2016-11-05 DIAGNOSIS — R1033 Periumbilical pain: Secondary | ICD-10-CM | POA: Diagnosis not present

## 2016-11-05 MED ORDER — HYDROCODONE-ACETAMINOPHEN 7.5-325 MG PO TABS: 1.0000 | ORAL_TABLET | Freq: Four times a day (QID) | ORAL | 0 refills | Status: DC | PRN

## 2016-11-05 NOTE — Telephone Encounter (Signed)
ROUTING TO DR HARRISON FOR APPROVAL 

## 2016-11-05 NOTE — Telephone Encounter (Signed)
Patient called for:  HYDROcodone-acetaminophen (NORCO) 7.5-325 MG tablet 60 tablet

## 2016-11-21 ENCOUNTER — Telehealth: Payer: Self-pay | Admitting: Orthopedic Surgery

## 2016-11-21 ENCOUNTER — Other Ambulatory Visit: Payer: Self-pay | Admitting: Orthopedic Surgery

## 2016-11-21 DIAGNOSIS — M1710 Unilateral primary osteoarthritis, unspecified knee: Secondary | ICD-10-CM

## 2016-11-21 MED ORDER — HYDROCODONE-ACETAMINOPHEN 7.5-325 MG PO TABS: 1.0000 | ORAL_TABLET | Freq: Four times a day (QID) | ORAL | 0 refills | Status: DC | PRN

## 2016-11-21 NOTE — Telephone Encounter (Signed)
Routing to Dr Harrison for approval 

## 2016-11-21 NOTE — Telephone Encounter (Signed)
Patient requests refill:  HYDROcodone-acetaminophen (NORCO) 7.5-325 MG tablet 60 tablet   (discussed online access with MyChart -  states will find out if he can obtain an email to set up access)

## 2016-12-12 ENCOUNTER — Telehealth: Payer: Self-pay | Admitting: Orthopedic Surgery

## 2016-12-12 NOTE — Telephone Encounter (Signed)
Patient requests refill on Hydrocodone/Acetaminophen (Norco) 7.5-325 mgs.   Qty  60  Sig: Take 1 tablet by mouth every 6 (six) hours as needed for moderate pain.

## 2016-12-13 NOTE — Telephone Encounter (Signed)
ROUTING TO DR HARRISON FOR APPROVAL 

## 2016-12-14 ENCOUNTER — Other Ambulatory Visit: Payer: Self-pay | Admitting: Orthopedic Surgery

## 2016-12-14 DIAGNOSIS — M1710 Unilateral primary osteoarthritis, unspecified knee: Secondary | ICD-10-CM

## 2016-12-14 MED ORDER — HYDROCODONE-ACETAMINOPHEN 7.5-325 MG PO TABS: 1.0000 | ORAL_TABLET | Freq: Four times a day (QID) | ORAL | 0 refills | Status: DC | PRN

## 2016-12-25 DIAGNOSIS — I10 Essential (primary) hypertension: Secondary | ICD-10-CM | POA: Diagnosis not present

## 2016-12-25 DIAGNOSIS — K219 Gastro-esophageal reflux disease without esophagitis: Secondary | ICD-10-CM | POA: Diagnosis not present

## 2017-01-01 ENCOUNTER — Ambulatory Visit (INDEPENDENT_AMBULATORY_CARE_PROVIDER_SITE_OTHER): Payer: Medicare Other | Admitting: Orthopedic Surgery

## 2017-01-01 ENCOUNTER — Encounter: Payer: Self-pay | Admitting: Orthopedic Surgery

## 2017-01-01 DIAGNOSIS — M1712 Unilateral primary osteoarthritis, left knee: Secondary | ICD-10-CM | POA: Diagnosis not present

## 2017-01-01 DIAGNOSIS — M1710 Unilateral primary osteoarthritis, unspecified knee: Secondary | ICD-10-CM

## 2017-01-01 DIAGNOSIS — M7551 Bursitis of right shoulder: Secondary | ICD-10-CM

## 2017-01-01 DIAGNOSIS — M7552 Bursitis of left shoulder: Secondary | ICD-10-CM | POA: Diagnosis not present

## 2017-01-01 DIAGNOSIS — M545 Low back pain: Secondary | ICD-10-CM | POA: Diagnosis not present

## 2017-01-01 DIAGNOSIS — M170 Bilateral primary osteoarthritis of knee: Secondary | ICD-10-CM | POA: Diagnosis not present

## 2017-01-01 DIAGNOSIS — G8929 Other chronic pain: Secondary | ICD-10-CM | POA: Diagnosis not present

## 2017-01-01 MED ORDER — HYDROCODONE-ACETAMINOPHEN 7.5-325 MG PO TABS: 1.0000 | ORAL_TABLET | Freq: Four times a day (QID) | ORAL | 0 refills | Status: DC | PRN
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
## 2017-01-01 NOTE — Patient Instructions (Signed)
Continue walking for exercise  Continue shoulder exercises   You have received an injection of steroids into the joint. 15% of patients will have increased pain within the 24 hours postinjection.   This is transient and will go away.   We recommend that you use ice packs on the injection site for 20 minutes every 2 hours and extra strength Tylenol 2 tablets every 8 as needed until the pain resolves.  If you continue to have pain after taking the Tylenol and using the ice please call the office for further instructions.

## 2017-01-21 ENCOUNTER — Other Ambulatory Visit: Payer: Self-pay | Admitting: Orthopedic Surgery

## 2017-01-21 ENCOUNTER — Telehealth: Payer: Self-pay | Admitting: Orthopedic Surgery

## 2017-01-21 DIAGNOSIS — M1710 Unilateral primary osteoarthritis, unspecified knee: Secondary | ICD-10-CM

## 2017-01-21 MED ORDER — HYDROCODONE-ACETAMINOPHEN 7.5-325 MG PO TABS: 1.0000 | ORAL_TABLET | Freq: Four times a day (QID) | ORAL | 0 refills | Status: DC | PRN

## 2017-01-21 NOTE — Telephone Encounter (Signed)
Routing to Dr Harrison for approval 

## 2017-01-21 NOTE — Telephone Encounter (Signed)
Patient requests refill on Hydrocodone/Acetgaminophen 7.5-325  Mgs.   Qty  60        Sig: Take 1 tablet by mouth every 6 (six) hours as needed for moderate pain.

## 2017-02-08 ENCOUNTER — Other Ambulatory Visit: Payer: Self-pay | Admitting: Orthopedic Surgery

## 2017-02-08 ENCOUNTER — Telehealth: Payer: Self-pay | Admitting: Orthopedic Surgery

## 2017-02-08 DIAGNOSIS — M1710 Unilateral primary osteoarthritis, unspecified knee: Secondary | ICD-10-CM

## 2017-02-08 MED ORDER — HYDROCODONE-ACETAMINOPHEN 7.5-325 MG PO TABS: 1.0000 | ORAL_TABLET | Freq: Four times a day (QID) | ORAL | 0 refills | Status: DC | PRN

## 2017-02-08 NOTE — Telephone Encounter (Signed)
Patient requests refill on Hydrocodone/Acetaminophen 7.5-325  Mgs.   Qty  60 ° ° °Sig: Take 1 tablet by mouth every 6 (six) hours as needed for moderate pain. °

## 2017-02-28 ENCOUNTER — Telehealth: Payer: Self-pay | Admitting: Orthopedic Surgery

## 2017-02-28 DIAGNOSIS — M1710 Unilateral primary osteoarthritis, unspecified knee: Secondary | ICD-10-CM

## 2017-02-28 NOTE — Telephone Encounter (Signed)
Routing to Dr. Harrison to advise 

## 2017-02-28 NOTE — Telephone Encounter (Signed)
Patient requests refill on Hydrocodone/Acetaminophen 7.5-325  Mgs.   Qty  60   Sig: Take 1 tablet by mouth every 6 (six) hours as needed for moderate pain.

## 2017-03-01 MED ORDER — HYDROCODONE-ACETAMINOPHEN 7.5-325 MG PO TABS: 1.0000 | ORAL_TABLET | Freq: Four times a day (QID) | ORAL | 0 refills | Status: DC | PRN

## 2017-03-08 DIAGNOSIS — E118 Type 2 diabetes mellitus with unspecified complications: Secondary | ICD-10-CM | POA: Diagnosis not present

## 2017-03-08 DIAGNOSIS — I10 Essential (primary) hypertension: Secondary | ICD-10-CM | POA: Diagnosis not present

## 2017-03-08 DIAGNOSIS — G4730 Sleep apnea, unspecified: Secondary | ICD-10-CM | POA: Diagnosis not present

## 2017-03-18 ENCOUNTER — Telehealth: Payer: Self-pay | Admitting: Orthopedic Surgery

## 2017-03-18 ENCOUNTER — Other Ambulatory Visit: Payer: Self-pay | Admitting: Orthopedic Surgery

## 2017-03-18 DIAGNOSIS — M1710 Unilateral primary osteoarthritis, unspecified knee: Secondary | ICD-10-CM

## 2017-03-18 MED ORDER — HYDROCODONE-ACETAMINOPHEN 7.5-325 MG PO TABS: 1.0000 | ORAL_TABLET | Freq: Four times a day (QID) | ORAL | 0 refills | Status: DC | PRN

## 2017-03-18 NOTE — Telephone Encounter (Signed)
Patient called for refill:  HYDROcodone-acetaminophen (NORCO) 7.5-325 MG tablet 60 tablet

## 2017-03-20 DIAGNOSIS — E118 Type 2 diabetes mellitus with unspecified complications: Secondary | ICD-10-CM | POA: Diagnosis not present

## 2017-03-20 DIAGNOSIS — I10 Essential (primary) hypertension: Secondary | ICD-10-CM | POA: Diagnosis not present

## 2017-04-05 ENCOUNTER — Ambulatory Visit (INDEPENDENT_AMBULATORY_CARE_PROVIDER_SITE_OTHER): Payer: Medicare Other | Admitting: Orthopedic Surgery

## 2017-04-05 VITALS — BP 136/81 | HR 77 | Ht 68.0 in | Wt 277.0 lb

## 2017-04-05 DIAGNOSIS — M170 Bilateral primary osteoarthritis of knee: Secondary | ICD-10-CM | POA: Diagnosis not present

## 2017-04-05 DIAGNOSIS — M7552 Bursitis of left shoulder: Secondary | ICD-10-CM

## 2017-04-05 DIAGNOSIS — M7551 Bursitis of right shoulder: Secondary | ICD-10-CM

## 2017-04-05 DIAGNOSIS — M545 Low back pain: Secondary | ICD-10-CM

## 2017-04-05 DIAGNOSIS — G8929 Other chronic pain: Secondary | ICD-10-CM

## 2017-04-05 DIAGNOSIS — G894 Chronic pain syndrome: Secondary | ICD-10-CM

## 2017-04-05 MED ORDER — HYDROCODONE-ACETAMINOPHEN 7.5-325 MG PO TABS: 1.0000 | ORAL_TABLET | Freq: Four times a day (QID) | ORAL | 0 refills | Status: DC | PRN

## 2017-04-05 MED ORDER — IBUPROFEN 800 MG PO TABS: 800.0000 mg | ORAL_TABLET | Freq: Three times a day (TID) | ORAL | 5 refills | Status: DC

## 2017-04-05 NOTE — Progress Notes (Signed)
Routine follow-up visit.  Chief Complaint  Patient presents with  . Follow-up    Recheck on knnes and shoulders.    Encounter Diagnoses  Name Primary?  . Chronic pain syndrome Yes  . Chronic low back pain, unspecified back pain laterality, with sciatica presence unspecified   . Bilateral shoulder bursitis   . Primary osteoarthritis of both knees     Patient has arthritis of both knees: He is being followed with serial examinations as needed and being treated with hydrocodone which is controlling his pain along with ibuprofen.  He says he has good and bad days. On the bad days he does have bilateral dull aching pain with occasional throbbing. No major swelling has been noted in the knee is no catching locking or giving way  Bilateral shoulder bursitis  Status post injections subacromial bursitis: He says his shoulders have been doing well he's not having any night pain at present he is able to move them all well above his head perform all his activities of daily living without any problems  His chronic low back pain is quite S and at present he's not have any bowel bladder dysfunction related to that. He may have a lower backache here and there was no leg pain  Review of Systems  Constitutional: Negative for chills, fever, malaise/fatigue and weight loss.  Respiratory: Negative for shortness of breath.   Cardiovascular: Negative for chest pain.  Gastrointestinal: Negative.   Genitourinary: Negative.   Neurological: Positive for sensory change. Negative for tingling.   Past Medical History:  Diagnosis Date  . Acid reflux   . Arthritis   . Asthma   . Chronic back pain   . Diabetes mellitus without complication ([place])   . Eczema   . Pneumonia     BP 136/81   Pulse 77   Ht 5\' 8"  (1.727 m)   Wt 277 lb (125.6 kg)   BMI 42.12 kg/m  Physical Exam  Constitutional: He is oriented to person, place, and time. He appears well-developed and well-nourished.  Musculoskeletal:   Normal heel-toe gait.  Neurological: He is alert and oriented to person, place, and time.  Psychiatric: He has a normal mood and affect. His behavior is normal. Judgment and thought content normal.   Right and left shoulder Right shoulder 150 of active flexion no tenderness stable in abduction external rotation grade 5 motor strength skin normal normal pulse in the right wrist no lymph nodes palpable in the shoulder or clavicle area right upper extremity sensation normal  Left shoulder 140 of active flexion no tenderness abduction stress test normal grade 5 strength in the rotator cuff skin without rash or erythema normal radial pulse normal lymph nodes in the axilla and clavicle area  Right knee No effusion range of motion 5 flexion contracture flexion 125 stable and anterior posterior plane quadriceps strength grade 5 skin warm dry and intact without rash or erythema no peripheral edema  Left knee no flexion contracture knee flexion 125 knee stable in all planes quadricep strength normal skin intact without erythema no peripheral edema normal sensation  Meds ordered this encounter  Medications  . HYDROcodone-acetaminophen (NORCO) 7.5-325 MG tablet    Sig: Take 1 tablet by mouth every 6 (six) hours as needed for moderate pain.    Dispense:  60 tablet    Refill:  0  . ibuprofen (ADVIL,MOTRIN) 800 MG tablet    Sig: Take 1 tablet (800 mg total) by mouth 3 (three) times daily.  Dispense:  90 tablet    Refill:  5    Encounter Diagnoses  Name Primary?  . Chronic pain syndrome Yes  . Chronic low back pain, unspecified back pain laterality, with sciatica presence unspecified   . Bilateral shoulder bursitis   . Primary osteoarthritis of both knees    3 month follow

## 2017-04-22 ENCOUNTER — Other Ambulatory Visit: Payer: Self-pay | Admitting: Orthopedic Surgery

## 2017-04-22 ENCOUNTER — Telehealth: Payer: Self-pay | Admitting: Orthopedic Surgery

## 2017-04-22 DIAGNOSIS — G894 Chronic pain syndrome: Secondary | ICD-10-CM

## 2017-04-22 MED ORDER — HYDROCODONE-ACETAMINOPHEN 7.5-325 MG PO TABS: 1.0000 | ORAL_TABLET | Freq: Four times a day (QID) | ORAL | 0 refills | Status: DC | PRN

## 2017-04-22 NOTE — Telephone Encounter (Signed)
Patient called for refill of:  HYDROcodone-acetaminophen (NORCO) 7.5-325 MG tablet 60 tablet

## 2017-04-22 NOTE — Progress Notes (Signed)
narx scores narc 400 sedative 150 stimulant 030 risk score 110

## 2017-05-14 ENCOUNTER — Ambulatory Visit: Payer: Medicare Other | Admitting: Orthopedic Surgery

## 2017-05-21 ENCOUNTER — Telehealth: Payer: Self-pay | Admitting: Orthopedic Surgery

## 2017-05-21 ENCOUNTER — Other Ambulatory Visit: Payer: Self-pay | Admitting: Orthopedic Surgery

## 2017-05-21 DIAGNOSIS — G894 Chronic pain syndrome: Secondary | ICD-10-CM

## 2017-05-21 MED ORDER — HYDROCODONE-ACETAMINOPHEN 7.5-325 MG PO TABS: 1.0000 | ORAL_TABLET | Freq: Four times a day (QID) | ORAL | 0 refills | Status: DC | PRN

## 2017-05-21 NOTE — Telephone Encounter (Signed)
Hydrocodone-Acetaminophen 7.5/325 mg  Qty  60 Tablets  Take 1 tablet by mouth every 6 (six) hours as needed for moderate pain.

## 2017-05-22 ENCOUNTER — Other Ambulatory Visit: Payer: Self-pay | Admitting: Orthopedic Surgery

## 2017-05-22 DIAGNOSIS — G894 Chronic pain syndrome: Secondary | ICD-10-CM

## 2017-05-22 MED ORDER — HYDROCODONE-ACETAMINOPHEN 7.5-325 MG PO TABS: 1.0000 | ORAL_TABLET | Freq: Four times a day (QID) | ORAL | 0 refills | Status: DC | PRN

## 2017-05-22 NOTE — Telephone Encounter (Signed)
ready

## 2017-05-22 NOTE — Progress Notes (Signed)
pmp 100

## 2017-05-28 ENCOUNTER — Ambulatory Visit: Payer: Medicare Other | Admitting: Orthopedic Surgery

## 2017-05-28 ENCOUNTER — Encounter: Payer: Self-pay | Admitting: Orthopedic Surgery

## 2017-06-05 ENCOUNTER — Ambulatory Visit (INDEPENDENT_AMBULATORY_CARE_PROVIDER_SITE_OTHER): Payer: Medicare Other | Admitting: Orthopedic Surgery

## 2017-06-05 VITALS — BP 162/103 | HR 84 | Ht 68.0 in | Wt 277.0 lb

## 2017-06-05 DIAGNOSIS — G4733 Obstructive sleep apnea (adult) (pediatric): Secondary | ICD-10-CM | POA: Diagnosis not present

## 2017-06-05 DIAGNOSIS — M170 Bilateral primary osteoarthritis of knee: Secondary | ICD-10-CM

## 2017-06-05 NOTE — Progress Notes (Signed)
Chief Complaint  Patient presents with  . Follow-up    Recheck on bilateral knees.   Encounter Diagnosis  Name Primary?  . Primary osteoarthritis of both knees Yes    Procedure note left knee injection verbal consent was obtained to inject left knee joint  Timeout was completed to confirm the site of injection  The medications used were 40 mg of Depo-Medrol and 1% lidocaine 3 cc  Anesthesia was provided by ethyl chloride and the skin was prepped with alcohol.  After cleaning the skin with alcohol a 20-gauge needle was used to inject the left knee joint. There were no complications. A sterile bandage was applied.   Procedure note right knee injection verbal consent was obtained to inject right knee joint  Timeout was completed to confirm the site of injection  The medications used were 40 mg of Depo-Medrol and 1% lidocaine 3 cc  Anesthesia was provided by ethyl chloride and the skin was prepped with alcohol.  After cleaning the skin with alcohol a 20-gauge needle was used to inject the right knee joint. There were no complications. A sterile bandage was applied.

## 2017-06-11 ENCOUNTER — Telehealth: Payer: Self-pay | Admitting: Orthopedic Surgery

## 2017-06-11 NOTE — Telephone Encounter (Signed)
Patient requests refill on Hydrocodone/Acetaminophen 7.5-325  Mgs.   Qty  60  Sig: Take 1 tablet by mouth every 6 (six) hours as needed for moderate pain.

## 2017-06-12 ENCOUNTER — Other Ambulatory Visit: Payer: Self-pay | Admitting: Orthopedic Surgery

## 2017-06-12 DIAGNOSIS — G894 Chronic pain syndrome: Secondary | ICD-10-CM

## 2017-06-12 DIAGNOSIS — M170 Bilateral primary osteoarthritis of knee: Secondary | ICD-10-CM

## 2017-06-12 MED ORDER — HYDROCODONE-ACETAMINOPHEN 7.5-325 MG PO TABS: 1.0000 | ORAL_TABLET | Freq: Four times a day (QID) | ORAL | 0 refills | Status: DC | PRN

## 2017-06-12 NOTE — Telephone Encounter (Signed)
[place]

## 2017-06-14 DIAGNOSIS — G4733 Obstructive sleep apnea (adult) (pediatric): Secondary | ICD-10-CM | POA: Diagnosis not present

## 2017-06-14 DIAGNOSIS — E118 Type 2 diabetes mellitus with unspecified complications: Secondary | ICD-10-CM | POA: Diagnosis not present

## 2017-06-14 DIAGNOSIS — N329 Bladder disorder, unspecified: Secondary | ICD-10-CM | POA: Diagnosis not present

## 2017-06-14 DIAGNOSIS — M159 Polyosteoarthritis, unspecified: Secondary | ICD-10-CM | POA: Diagnosis not present

## 2017-07-06 DIAGNOSIS — G4733 Obstructive sleep apnea (adult) (pediatric): Secondary | ICD-10-CM | POA: Diagnosis not present

## 2017-07-08 ENCOUNTER — Ambulatory Visit (INDEPENDENT_AMBULATORY_CARE_PROVIDER_SITE_OTHER): Payer: Medicare Other | Admitting: Orthopedic Surgery

## 2017-07-08 ENCOUNTER — Encounter: Payer: Self-pay | Admitting: Orthopedic Surgery

## 2017-07-08 VITALS — BP 129/83 | HR 58 | Ht 68.0 in | Wt 273.0 lb

## 2017-07-08 DIAGNOSIS — M170 Bilateral primary osteoarthritis of knee: Secondary | ICD-10-CM | POA: Diagnosis not present

## 2017-07-08 DIAGNOSIS — M7552 Bursitis of left shoulder: Secondary | ICD-10-CM | POA: Diagnosis not present

## 2017-07-08 DIAGNOSIS — R200 Anesthesia of skin: Secondary | ICD-10-CM

## 2017-07-08 DIAGNOSIS — M25511 Pain in right shoulder: Secondary | ICD-10-CM | POA: Diagnosis not present

## 2017-07-08 DIAGNOSIS — G8929 Other chronic pain: Secondary | ICD-10-CM | POA: Diagnosis not present

## 2017-07-08 DIAGNOSIS — M25512 Pain in left shoulder: Secondary | ICD-10-CM

## 2017-07-08 DIAGNOSIS — M7551 Bursitis of right shoulder: Secondary | ICD-10-CM

## 2017-07-08 DIAGNOSIS — G5603 Carpal tunnel syndrome, bilateral upper limbs: Secondary | ICD-10-CM

## 2017-07-08 DIAGNOSIS — G894 Chronic pain syndrome: Secondary | ICD-10-CM

## 2017-07-08 MED ORDER — HYDROCODONE-ACETAMINOPHEN 7.5-325 MG PO TABS: 1.0000 | ORAL_TABLET | Freq: Four times a day (QID) | ORAL | 0 refills | Status: DC | PRN

## 2017-07-08 NOTE — Progress Notes (Signed)
PROGRESS NOTE  Chief Complaint  Patient presents with  . Shoulder Pain    bilateral    BP 129/83   Pulse (!) 58   Ht 5\' 8"  (1.727 m)   Wt 273 lb (123.8 kg)   BMI 41.51 kg/m   Encounter Diagnoses  Name Primary?  . Bilateral shoulder bursitis Yes  . Chronic pain of both shoulders     Past Medical History:  Diagnosis Date  . Acid reflux   . Arthritis   . Asthma   . Chronic back pain   . Diabetes mellitus without complication ([place])   . Eczema   . Pneumonia        Current Outpatient Medications:  .  albuterol (PROAIR HFA) 108 (90 Base) MCG/ACT inhaler, 2 puffs every 4 hours as needed only  if your can't catch your breath, Disp: 1 Inhaler, Rfl: 2 .  amoxicillin-clavulanate (AUGMENTIN) 875-125 MG tablet, Take 1 tablet by mouth 2 (two) times daily., Disp: 20 tablet, Rfl: 0 .  aspirin 81 MG tablet, Take 81 mg by mouth daily., Disp: , Rfl:  .  famotidine (PEPCID) 20 MG tablet, Take 20 mg by mouth 2 (two) times daily., Disp: , Rfl:  .  HYDROcodone-acetaminophen (NORCO) 7.5-325 MG tablet, Take 1 tablet by mouth every 6 (six) hours as needed for moderate pain., Disp: 30 tablet, Rfl: 0 .  ibuprofen (ADVIL,MOTRIN) 800 MG tablet, Take 1 tablet (800 mg total) by mouth 3 (three) times daily., Disp: 90 tablet, Rfl: 5 .  JANUMET XR [id number] MG TB24, TK 1 T PO QD, Disp: , Rfl: 3 .  PRESCRIPTION MEDICATION, Take 2 tablets by mouth 2 (two) times daily. Study medication for type two diabetes. (sotagliflozin 200mg ), Disp: , Rfl:     PMPAWARE review Narcotic  360  Sedative  140  Stimulant  020  Explanation and Guidance  OVERDOSE RISK SCORE  100  (Range 000-999)  Explanation and Guidance  ADDITIONAL RISK INDICATORS (1)  >= 5 opioid or sedative providers in any year in the last 2 years   52 yo male with chronic bilateral shoulder pain presents for follow up    c/o bilateral chronic and severity shoulder pain mild to moderate which she has had for several years previously treated  with cortisone injections ibuprofen at home exercises associated with bilateral numbness and tingling in both hands with pressure tightness and cramping which is been present now for 2-3 months and is a new finding.   Review of Systems  Constitutional: Negative for chills and fever.  Musculoskeletal: Positive for joint pain and myalgias. Negative for neck pain.  Neurological: Positive for tingling and sensory change. Negative for tremors and focal weakness.    Physical Exam  Constitutional: He is oriented to person, place, and time. He appears well-developed and well-nourished. No distress.  Cardiovascular: Intact distal pulses.  Musculoskeletal:       Arms:      Hands: Neurological: He is alert and oriented to person, place, and time.  Skin: Skin is warm and dry. Capillary refill takes less than 2 seconds. No rash noted. He is not diaphoretic. No erythema.  Psychiatric: He has a normal mood and affect. His behavior is normal.    Encounter Diagnoses  Name Primary?  . Bilateral shoulder bursitis   . Chronic pain of both shoulders   . Bilateral carpal tunnel syndrome Yes    Chronic bilateral shoulder pain consistent with bursitis he will continue hydrocodone ibuprofen  New onset carpal tunnel syndrome,  we will schedule him for a carpal tunnel test with a nerve conduction study and he will come back after that

## 2017-07-08 NOTE — Addendum Note (Signed)
Addended byCandice Camp on: 07/08/2017 09:14 AM   Modules accepted: Orders

## 2017-07-08 NOTE — Patient Instructions (Signed)
Schedule for carpal tunnel test

## 2017-07-11 DIAGNOSIS — L110 Acquired keratosis follicularis: Secondary | ICD-10-CM | POA: Diagnosis not present

## 2017-07-11 DIAGNOSIS — E1140 Type 2 diabetes mellitus with diabetic neuropathy, unspecified: Secondary | ICD-10-CM | POA: Diagnosis not present

## 2017-07-25 ENCOUNTER — Ambulatory Visit (INDEPENDENT_AMBULATORY_CARE_PROVIDER_SITE_OTHER): Payer: Medicare Other | Admitting: Physical Medicine and Rehabilitation

## 2017-07-25 ENCOUNTER — Encounter (INDEPENDENT_AMBULATORY_CARE_PROVIDER_SITE_OTHER): Payer: Self-pay | Admitting: Physical Medicine and Rehabilitation

## 2017-07-25 DIAGNOSIS — R202 Paresthesia of skin: Secondary | ICD-10-CM

## 2017-07-25 NOTE — Progress Notes (Deleted)
Pt states numbness and tingling in right and left hands. Pt states numbness and tingling has been going on for about 2 years. Pt states when he goes to sleep the symptoms are worse. Pt states these symptoms do not bother him during the day only at bedtime. Pt states when he raises him arm or use his hands makes it better. Right hand dominant. No lotions or creams.

## 2017-07-29 ENCOUNTER — Ambulatory Visit
Admit: 2017-07-29 | Discharge: 2017-07-29 | Payer: BLUE CROSS/BLUE SHIELD | Attending: Internal Medicine | Primary: Internal Medicine

## 2017-07-29 ENCOUNTER — Telehealth: Payer: Self-pay | Admitting: Orthopedic Surgery

## 2017-07-29 ENCOUNTER — Other Ambulatory Visit: Payer: Self-pay | Admitting: Orthopedic Surgery

## 2017-07-29 DIAGNOSIS — I739 Peripheral vascular disease, unspecified: Secondary | ICD-10-CM

## 2017-07-29 DIAGNOSIS — G894 Chronic pain syndrome: Secondary | ICD-10-CM

## 2017-07-29 DIAGNOSIS — M170 Bilateral primary osteoarthritis of knee: Secondary | ICD-10-CM

## 2017-07-29 MED ORDER — HYDROCODONE-ACETAMINOPHEN 7.5-325 MG PO TABS: 1.0000 | ORAL_TABLET | Freq: Four times a day (QID) | ORAL | 0 refills | Status: DC | PRN

## 2017-07-29 NOTE — Progress Notes (Signed)
HPI Notes    Name: Mark Buchanan  DOB: Apr 16, 1966         Chief Complaint:     Chief Complaint   Patient presents with   . Check-Up     Pt has paperwork to fill out for work.        History of Present Illness:        Mark FearingJames is a new patient.  He has not been seen by any  PCP for about 3 years .  He presents to office for a general check up.   He went to [employer] for employment physical and they did not have patient's medication list   They also needed more information regarding patient's leg pain with walking up incline  I have reviewed patient's previous records  He has a h/o PAD and was following up with a vascular surgeon Dr. Hermelinda MedicusSchwartz in [place]. LAs time was seen on 04/06/2015.    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    The patient reports no pain in legs. Has no wounds, no CP. Still smokes 1 ppd.  He is on ASA, Plavix and Atorvastatin. Meds refilled on 09/14/16 for one year supply.              Past Medical History:     Past Medical History:   Diagnosis Date   . Hyperlipidemia       Reviewed all health maintenance requirements and orderedappropriate tests  Health Maintenance Due   Topic Date Due   . Shingles Vaccine (1 of 2 - 2 Dose Series) 02/21/2014   . Potassium monitoring  02/13/2017   . Creatinine monitoring  02/13/2017       Past Surgical History:     Past Surgical History:   Procedure Laterality Date   . CORONARY ANGIOPLASTY WITH STENT PLACEMENT     . EXPLORATION OF WOUND OF EXTREMITY Left 02/14/2016    WOUND EXPLORATION AND REVISION performed by Rom[facility] Levee[facility]id L [facility]        Medications:       Prior to Admission medications    Medication Sig Start Date End Date Taking? Authorizing Provider   clopidogrel (PLAVIX) 75 MG tablet Take 75 mg by mouth daily 08/11/15  Yes Historical Provider, MD   aspirin 81 MG tablet Take 81 mg by mouth daily   Yes Historical  Provider, MD   atorvastatin (LIPITOR) 20 MG tablet Take 20 mg by mouth daily   Yes Historical Provider, MD   albuterol (PROVENTIL HFA) 108 (90 BASE) MCG/ACT inhaler Inhale 2 puffs into the lungs every 6 hours as needed for Wheezing 02/11/14 02/11/15  Demetrio LappingEvillo M Domingo, MD        Allergies:       Patient has no known allergies.    Social History:     Tobacco: reports that he has quit smoking. His smoking use included Cigarettes. He smoked 1.00 pack per day. He has never used smokeless tobacco.  Alcohol:      reports that he does not drink alcohol.  Drug Use:  reports that he does not use drugs.    Family History:     No family history on file.    Review of Systems:         Review of Systems   Constitutional: Negative for activity change, appetite change,  chills, fever and unexpected weight change.   HENT: Negative for congestion, sore throat, trouble swallowing and voice change.    Respiratory: Negative for cough, chest tightness, shortness of breath and wheezing.    Cardiovascular: Negative for chest pain, palpitations and leg swelling.   Gastrointestinal: Negative for abdominal pain, constipation, diarrhea, nausea and vomiting.   Genitourinary: Negative for dysuria.   Musculoskeletal: Negative.    Skin: Negative for rash.   Neurological: Negative for dizziness, tremors and headaches.   Psychiatric/Behavioral: The patient is not nervous/anxious.          Physical Exam:     Vitals:  BP 138/82   Pulse 78   Ht 5\' 7"  (1.702 m)   Wt 146 lb (66.2 kg)   BMI 22.87 kg/m       Physical Exam   Constitutional: He is oriented to person, place, and time. He appears well-developed and well-nourished. No distress.   HENT:   Head: Normocephalic and atraumatic.   Right Ear: External ear normal.   Left Ear: External ear normal.   Mouth/Throat: Oropharynx is clear and moist. No oropharyngeal exudate.   Eyes: Pupils are equal, round, and reactive to light. Right eye exhibits no discharge. Left eye exhibits no discharge.   Neck: No  thyromegaly present.   Cardiovascular: Normal rate, regular rhythm and normal heart sounds.    No murmur heard.  Pulmonary/Chest: Effort normal and breath sounds normal. He has no wheezes. He has no rales.   Abdominal: Soft. Bowel sounds are normal. He exhibits no distension and no mass. There is no tenderness.   Musculoskeletal: Normal range of motion. He exhibits no edema, tenderness or deformity.   Lymphadenopathy:     He has no cervical adenopathy.   Neurological: He is alert and oriented to person, place, and time. No cranial nerve deficit. Coordination normal.   Skin: Skin is warm and dry. No rash noted.   Psychiatric: He has a normal mood and affect. His behavior is normal. Judgment normal.   Vitals reviewed.             Data:     Lab Results   Component Value Date    NA 140 02/14/2016    K 3.4 02/14/2016    CL 106 02/14/2016    CO2 25 02/14/2016    BUN 9 02/14/2016    CREATININE 1.33 02/14/2016    GLUCOSE 140 02/14/2016     Lab Results   Component Value Date    WBC 8.0 02/15/2016    RBC 3.42 02/15/2016    HGB 10.7 02/15/2016    HCT 32.3 02/15/2016    MCV 94.4 02/15/2016    MCH 31.3 02/15/2016    MCHC 33.2 02/15/2016    RDW 14.4 02/15/2016    PLT 197 02/15/2016    MPV NOT REPORTED 02/15/2016     No results found for: TSH  Lab Results   Component Value Date    CHOL 96 10/29/2014    HDL 31 10/29/2014          Assessment & Plan        Diagnosis Orders   1. PAD (peripheral artery disease) (HCC)   Patient asymptomatic. He is on Plavix, ASA, Lipitor. Says compliant with medications.  Advised to follow up in 6 months.                      Completed Refills   Requested Prescriptions      No  prescriptions requested or ordered in this encounter     Return in about 6 months (around 01/26/2018) for chek up.  No orders of the defined types were placed in this encounter.    No orders of the defined types were placed in this encounter.        There are no Patient Instructions on file for this visit.    Electronically signed  by Damita Lack, MD on 07/29/2017 at 4:01 PM           Completed Refills   Requested Prescriptions      No prescriptions requested or ordered in this encounter
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
## 2017-07-29 NOTE — Procedures (Signed)
EMG & NCV Findings: Evaluation of the left ulnar motor and the right ulnar motor nerves showed decreased conduction velocity (B Elbow-Wrist, L51, R52 m/s).  The left median (across palm) sensory nerve showed prolonged distal peak latency (Wrist, 3.8 ms).  The right median (across palm) sensory nerve showed no response (Palm) and prolonged distal peak latency (4.6 ms).  The right ulnar sensory nerve showed reduced amplitude (11.3 V).  All remaining nerves (as indicated in the following tables) were within normal limits.  Left vs. Right side comparison data for the ulnar motor nerve indicates abnormal L-R amplitude difference (39.2 %).  The ulnar sensory nerve indicates abnormal L-R latency difference (1.3 ms).  All remaining left vs. right side differences were within normal limits.    All examined muscles (as indicated in the following table) showed no evidence of electrical instability.    Impression: The above electrodiagnostic study is ABNORMAL and reveals evidence of a mild BILATERAL median nerve entrapment at the wrist (carpal tunnel syndrome) affecting sensory components.   There is no significant electrodiagnostic evidence of any other focal nerve entrapment, brachial plexopathy or generalized peripheral neuropathy.   Recommendations: 1.  Follow-up with referring physician. 2.  Continue current management of symptoms. 3.  Continue use of resting splint at night-time and as needed during the day.   Nerve Conduction Studies Anti Sensory Summary Table   Stim Site NR Peak (ms) Norm Peak (ms) P-T Amp (V) Norm P-T Amp Site1 Site2 Delta-P (ms) Dist (cm) Vel (m/s) Norm Vel (m/s)  Left Median Acr Palm Anti Sensory (2nd Digit)  33C  Wrist    *3.8 <3.6 14.0 >10 Wrist Palm 1.9 0.0    Palm    1.9 <2.0 8.1         Right Median Acr Palm Anti Sensory (2nd Digit)  33.1C  Wrist    *4.6 <3.6 12.7 >10 Wrist Palm  0.0    Palm *NR  <2.0          Left Radial Anti Sensory (Base 1st Digit)  32.1C    Wrist    2.6 <3.1 10.0  Wrist Base 1st Digit 2.6 0.0    Right Radial Anti Sensory (Base 1st Digit)  31.8C  Wrist    2.4 <3.1 5.4  Wrist Base 1st Digit 2.4 0.0    Left Ulnar Anti Sensory (5th Digit)  33C  Wrist    2.0 <3.7 20.2 >15.0 Wrist 5th Digit 2.0 14.0 70 >38  Right Ulnar Anti Sensory (5th Digit)  32.5C  Wrist    3.3 <3.7 *11.3 >15.0 Wrist 5th Digit 3.3 14.0 42 >38   Motor Summary Table   Stim Site NR Onset (ms) Norm Onset (ms) O-P Amp (mV) Norm O-P Amp Site1 Site2 Delta-0 (ms) Dist (cm) Vel (m/s) Norm Vel (m/s)  Left Median Motor (Abd Poll Brev)  32.2C  Wrist    3.3 <4.2 6.0 >5 Elbow Wrist 4.8 24.0 50 >50  Elbow    8.1  5.3         Right Median Motor (Abd Poll Brev)  32C  Wrist    3.8 <4.2 7.1 >5 Elbow Wrist 5.0 25.5 51 >50  Elbow    8.8  6.7         Left Ulnar Motor (Abd Dig Min)  32.3C  Wrist    3.1 <4.2 6.2 >3 B Elbow Wrist 4.9 25.0 *51 >53  B Elbow    8.0  4.2  A Elbow B Elbow 2.2 12.0 55 >  53  A Elbow    10.2  4.2         Right Ulnar Motor (Abd Dig Min)  31.2C  Wrist    2.8 <4.2 10.2 >3 B Elbow Wrist 4.8 25.0 *52 >53  B Elbow    7.6  5.7  A Elbow B Elbow 1.9 10.0 53 >53  A Elbow    9.5  9.1          EMG   Side Muscle Nerve Root Ins Act Fibs Psw Amp Dur Poly Recrt Int Fraser Din Comment  Left Abd Poll Brev Median C8-T1 Nml Nml Nml Nml Nml 0 Nml Nml   Left 1stDorInt Ulnar C8-T1 Nml Nml Nml Nml Nml 0 Nml Nml   Left PronatorTeres Median C6-7 Nml Nml Nml Nml Nml 0 Nml Nml   Left Biceps Musculocut C5-6 Nml Nml Nml Nml Nml 0 Nml Nml   Left Deltoid Axillary C5-6 Nml Nml Nml Nml Nml 0 Nml Nml     Nerve Conduction Studies Anti Sensory Left/Right Comparison   Stim Site L Lat (ms) R Lat (ms) L-R Lat (ms) L Amp (V) R Amp (V) L-R Amp (%) Site1 Site2 L Vel (m/s) R Vel (m/s) L-R Vel (m/s)  Median Acr Palm Anti Sensory (2nd Digit)  33C  Wrist *3.8 *4.6 0.8 14.0 12.7 9.3 Wrist Palm     Palm 1.9   8.1         Radial Anti Sensory (Base 1st Digit)  32.1C  Wrist 2.6 2.4 0.2 10.0  5.4 46.0 Wrist Base 1st Digit     Ulnar Anti Sensory (5th Digit)  33C  Wrist 2.0 3.3 *1.3 20.2 *11.3 44.1 Wrist 5th Digit 70 42 28   Motor Left/Right Comparison   Stim Site L Lat (ms) R Lat (ms) L-R Lat (ms) L Amp (mV) R Amp (mV) L-R Amp (%) Site1 Site2 L Vel (m/s) R Vel (m/s) L-R Vel (m/s)  Median Motor (Abd Poll Brev)  32.2C  Wrist 3.3 3.8 0.5 6.0 7.1 15.5 Elbow Wrist 50 51 1  Elbow 8.1 8.8 0.7 5.3 6.7 20.9       Ulnar Motor (Abd Dig Min)  32.3C  Wrist 3.1 2.8 0.3 6.2 10.2 *39.2 B Elbow Wrist *51 *52 1  B Elbow 8.0 7.6 0.4 4.2 5.7 26.3 A Elbow B Elbow 55 53 2  A Elbow 10.2 9.5 0.7 4.2 9.1 53.8          Waveforms:

## 2017-07-29 NOTE — Telephone Encounter (Signed)
Patient requests refill on Hydrocodone/Acetaminophen 7.5-325  Mgs.   Qty  30       Sig: Take 1 tablet by mouth every 6 (six) hours as needed for moderate pain.     [facility]

## 2017-08-06 DIAGNOSIS — G4733 Obstructive sleep apnea (adult) (pediatric): Secondary | ICD-10-CM | POA: Diagnosis not present

## 2017-08-12 ENCOUNTER — Telehealth: Payer: Self-pay | Admitting: Orthopedic Surgery

## 2017-08-12 NOTE — Telephone Encounter (Signed)
Hydrocodone-Acetaminophen  7.5/325 mg  Qty 30 Tablets  Take 1 tablet by mouth every 6 (six) hours as needed for moderate pain.  PATIENT STATES HE USES [facility]

## 2017-08-16 ENCOUNTER — Other Ambulatory Visit: Payer: Self-pay | Admitting: Orthopedic Surgery

## 2017-08-16 DIAGNOSIS — M170 Bilateral primary osteoarthritis of knee: Secondary | ICD-10-CM

## 2017-08-16 DIAGNOSIS — G894 Chronic pain syndrome: Secondary | ICD-10-CM

## 2017-08-16 MED ORDER — HYDROCODONE-ACETAMINOPHEN 7.5-325 MG PO TABS: 1.0000 | ORAL_TABLET | Freq: Four times a day (QID) | ORAL | 0 refills | Status: DC | PRN

## 2017-08-22 ENCOUNTER — Telehealth: Payer: Self-pay | Admitting: Orthopedic Surgery

## 2017-08-22 ENCOUNTER — Other Ambulatory Visit: Payer: Self-pay | Admitting: Orthopedic Surgery

## 2017-08-22 DIAGNOSIS — M170 Bilateral primary osteoarthritis of knee: Secondary | ICD-10-CM

## 2017-08-22 DIAGNOSIS — G894 Chronic pain syndrome: Secondary | ICD-10-CM

## 2017-08-22 MED ORDER — HYDROCODONE-ACETAMINOPHEN 7.5-325 MG PO TABS: 1.0000 | ORAL_TABLET | Freq: Four times a day (QID) | ORAL | 0 refills | Status: DC | PRN

## 2017-08-22 NOTE — Telephone Encounter (Signed)
Hydrocodone-Acetaminophen  7.5/325 mg  Qty 30 Tablets  Take 1 tablet by mouth every 6 (six) hours as needed for moderate pain.  [facility]

## 2017-08-22 NOTE — Progress Notes (Signed)
Narcotic  351  Sedative  140  Stimulant  020  Explanation and Guidance  OVERDOSE RISK SCORE  100  (Range 000-999)  Explanation and Guidance  ADDITIONAL RISK INDICATORS ( 1 )  >= 5 opioid or sedative providers in any year in the last 2 years

## 2017-08-23 ENCOUNTER — Ambulatory Visit: Payer: Medicare Other | Admitting: Orthopedic Surgery

## 2017-08-26 ENCOUNTER — Encounter: Payer: Self-pay | Admitting: Orthopedic Surgery

## 2017-08-26 ENCOUNTER — Ambulatory Visit (INDEPENDENT_AMBULATORY_CARE_PROVIDER_SITE_OTHER): Payer: Medicare Other | Admitting: Orthopedic Surgery

## 2017-08-26 VITALS — BP 137/86 | HR 84 | Ht 68.0 in | Wt 270.0 lb

## 2017-08-26 DIAGNOSIS — G894 Chronic pain syndrome: Secondary | ICD-10-CM

## 2017-08-26 DIAGNOSIS — M170 Bilateral primary osteoarthritis of knee: Secondary | ICD-10-CM

## 2017-08-26 DIAGNOSIS — G5603 Carpal tunnel syndrome, bilateral upper limbs: Secondary | ICD-10-CM

## 2017-08-26 MED ORDER — HYDROCODONE-ACETAMINOPHEN 7.5-325 MG PO TABS: 1.0000 | ORAL_TABLET | Freq: Four times a day (QID) | ORAL | 0 refills | Status: DC | PRN

## 2017-08-26 NOTE — Progress Notes (Signed)
Chief Complaint  Patient presents with  . Knee Pain    bilateral   . Medication Refill    Hydrocodone    Encounter Diagnosis  Name Primary?  . Primary osteoarthritis of both knees Yes   Carpal tunnel bilateral   Patient has had numbness and tingling both hands for several months we sent him for carpal tunnel testing with EMG and that showed he has bilateral carpal tunnel entrapment mild at the wrist affecting sensory components only report is included in the chart by reference and has been reviewed  Symptoms noted  Exam shows tenderness over the carpal tunnel positive Tinel's decreased sensation in both carpal tunnel distributions  ncs emg positive for b/l CTS    Bilateral splinting   No improvement will consider carpal tunnel releases   Also came in today for bilateral knee injections   procedure note left knee injection verbal consent was obtained to inject left knee joint  Timeout was completed to confirm the site of injection  The medications used were 40 mg of Depo-Medrol and 1% lidocaine 3 cc  Anesthesia was provided by ethyl chloride and the skin was prepped with alcohol.  After cleaning the skin with alcohol a 20-gauge needle was used to inject the left knee joint. There were no complications. A sterile bandage was applied.   Procedure note right knee injection verbal consent was obtained to inject right knee joint  Timeout was completed to confirm the site of injection  The medications used were 40 mg of Depo-Medrol and 1% lidocaine 3 cc  Anesthesia was provided by ethyl chloride and the skin was prepped with alcohol.  After cleaning the skin with alcohol a 20-gauge needle was used to inject the right knee joint. There were no complications. A sterile bandage was applied.

## 2017-09-12 ENCOUNTER — Ambulatory Visit
Admit: 2017-09-12 | Discharge: 2017-09-12 | Payer: BLUE CROSS/BLUE SHIELD | Attending: Internal Medicine | Primary: Internal Medicine

## 2017-09-12 ENCOUNTER — Other Ambulatory Visit: Payer: Self-pay | Admitting: Orthopedic Surgery

## 2017-09-12 DIAGNOSIS — J019 Acute sinusitis, unspecified: Secondary | ICD-10-CM

## 2017-09-12 DIAGNOSIS — G894 Chronic pain syndrome: Secondary | ICD-10-CM

## 2017-09-12 DIAGNOSIS — M170 Bilateral primary osteoarthritis of knee: Secondary | ICD-10-CM

## 2017-09-12 MED ORDER — AZITHROMYCIN 500 MG PO TABS
500 MG | PACK | Freq: Every day | ORAL | 0 refills | Status: AC
Start: 2017-09-12 — End: 2017-09-15

## 2017-09-12 MED ORDER — HYDROCODONE-ACETAMINOPHEN 7.5-325 MG PO TABS: 1.0000 | ORAL_TABLET | Freq: Four times a day (QID) | ORAL | 0 refills | Status: DC | PRN

## 2017-09-12 NOTE — Patient Instructions (Signed)
SURVEY:    You may be receiving a survey from [employer] regarding your visit today.    Please complete the survey to enable us to provide the highest quality of care to you and your family.    If you cannot score us a very good on any question, please call the office to discuss how we could of made your experience a very good one.    Thank you.

## 2017-09-12 NOTE — Telephone Encounter (Signed)
Patient requests refill on Hydrocodone/Acetaminophen 7.5-325  Mgs.   Qty  60   Sig: Take 1 tablet by mouth every 6 (six) hours as needed for moderate pain.  Patient states he uses [facility] on [address] In [place]

## 2017-09-12 NOTE — Progress Notes (Signed)
HPI Notes    Name: Mark Buchanan  DOB: 01/02/1966         Chief Complaint:     Chief Complaint   Patient presents with   ??? Ear Fullness     States that he cant really hear out of the Lt ear, started about a week ago.   ??? Cough     has tried several OTC cough medications.   ??? Sinus Problem     all started about a week ago with the cough and sinus pressure.       History of Present Illness:        Mark Buchanan presents to office c/o cough, ear fullness, sinus problems  Symptoms started about 2 wks ago and persisting  Says multiple family members had URIs    Ear Fullness    There is pain in the left ear. This is a new problem. The current episode started 1 to 4 weeks ago. The problem occurs constantly. The problem has been unchanged. There has been no fever. The patient is experiencing no pain. Associated symptoms include coughing. Pertinent negatives include no abdominal pain, ear discharge, headaches, hearing loss, rash, rhinorrhea or sore throat. He has tried nothing for the symptoms.   Cough   The current episode started 1 to 4 weeks ago. The problem has been unchanged. The problem occurs hourly. The cough is productive of sputum. Associated symptoms include ear congestion. Pertinent negatives include no chest pain, chills, ear pain, eye redness, fever, headaches, rash, rhinorrhea, sore throat, shortness of breath or wheezing. Nothing aggravates the symptoms. Risk factors for lung disease include smoking/tobacco exposure. There is no history of COPD.   Sinus Problem   This is a new problem. The current episode started 1 to 4 weeks ago. The problem has been gradually worsening since onset. There has been no fever. Associated symptoms include congestion, coughing and sinus pressure. Pertinent negatives include no chills, ear pain, headaches, shortness of breath, sneezing or sore throat. Past treatments include nothing.           Past Medical History:     Past Medical History:   Diagnosis Date   ??? Hyperlipidemia        Reviewed all health maintenance requirements and orderedappropriate tests  Health Maintenance Due   Topic Date Due   ??? Shingles Vaccine (1 of 2) 02/21/2014   ??? Potassium monitoring  02/13/2017   ??? Creatinine monitoring  02/13/2017       Past Surgical History:     Past Surgical History:   Procedure Laterality Date   ??? CORONARY ANGIOPLASTY WITH STENT PLACEMENT     ??? EXPLORATION OF WOUND OF EXTREMITY Left 02/14/2016    WOUND EXPLORATION AND REVISION performed by Romie Levee, MD at [facility] OR        Medications:       Prior to Admission medications    Medication Sig Start Date End Date Taking? Authorizing Provider   azithromycin (ZITHROMAX) 500 MG tablet Take 1 tablet by mouth daily for 3 days 09/12/17 09/15/17 Yes Damita Lack, MD   clopidogrel (PLAVIX) 75 MG tablet Take 75 mg by mouth daily 08/11/15  Yes Historical Provider, MD   aspirin 81 MG tablet Take 81 mg by mouth daily   Yes Historical Provider, MD   atorvastatin (LIPITOR) 20 MG tablet Take 20 mg by mouth daily   Yes Historical Provider, MD   albuterol (PROVENTIL HFA) 108 (90 BASE) MCG/ACT inhaler Inhale 2 puffs into  the lungs every 6 hours as needed for Wheezing 02/11/14 02/11/15  Demetrio Lapping, MD        Allergies:       Patient has no known allergies.    Social History:     Tobacco: reports that he has quit smoking. His smoking use included cigarettes. He smoked 1.00 pack per day. He has never used smokeless tobacco.  Alcohol:      reports that he does not drink alcohol.  Drug Use:  reports that he does not use drugs.    Family History:     No family history on file.    Review of Systems:         Review of Systems   Constitutional: Negative for activity change, appetite change, chills, fever and unexpected weight change.   HENT: Positive for congestion and sinus pressure. Negative for ear discharge, ear pain, hearing loss, mouth sores, rhinorrhea, sneezing, sore throat, tinnitus, trouble swallowing and voice change.    Eyes: Negative for pain, discharge,  redness, itching and visual disturbance.   Respiratory: Positive for cough. Negative for chest tightness, shortness of breath and wheezing.    Cardiovascular: Negative for chest pain, palpitations and leg swelling.   Gastrointestinal: Negative for abdominal pain, blood in stool, constipation and nausea.   Endocrine: Negative for cold intolerance, heat intolerance, polydipsia and polyuria.   Genitourinary: Negative for difficulty urinating and dysuria.   Musculoskeletal: Negative.    Skin: Negative for rash.   Allergic/Immunologic: Negative.    Neurological: Negative for dizziness, syncope, weakness and headaches.   Psychiatric/Behavioral: Negative for behavioral problems and dysphoric mood. The patient is not nervous/anxious.          Physical Exam:     Vitals:  BP (!) 166/96 (Site: Right Upper Arm, Position: Sitting, Cuff Size: Medium Adult)    Pulse 83    Ht 5\' 7"  (1.702 m)    Wt 145 lb (65.8 kg)    SpO2 97%    BMI 22.71 kg/m??       Physical Exam   Constitutional: He is oriented to person, place, and time. He appears well-developed and well-nourished. No distress.   HENT:   Head: Normocephalic and atraumatic.   Mouth/Throat: Oropharynx is clear and moist. No oropharyngeal exudate.   Left ear with cerumen impaction, right ear with moderate amount of cerumen  MAxillary sinuses tender on palpation   Eyes: Conjunctivae are normal. Right eye exhibits no discharge. Left eye exhibits no discharge.   Neck: No thyromegaly present.   Cardiovascular: Normal rate, regular rhythm and normal heart sounds.   No murmur heard.  Pulmonary/Chest: Effort normal and breath sounds normal. He has no wheezes. He has no rales.   Abdominal: Soft. Bowel sounds are normal. He exhibits no distension and no mass. There is no tenderness.   Musculoskeletal: Normal range of motion. He exhibits no edema.   Lymphadenopathy:     He has no cervical adenopathy.   Neurological: He is alert and oriented to person, place, and time. No cranial nerve  deficit.   Skin: Skin is warm and dry. No rash noted.   Psychiatric: He has a normal mood and affect. His behavior is normal. Judgment normal.   Vitals reviewed.            Data:     Lab Results   Component Value Date    NA 140 02/14/2016    K 3.4 02/14/2016    CL 106 02/14/2016  CO2 25 02/14/2016    BUN 9 02/14/2016    CREATININE 1.33 02/14/2016    GLUCOSE 140 02/14/2016     Lab Results   Component Value Date    WBC 8.0 02/15/2016    RBC 3.42 02/15/2016    HGB 10.7 02/15/2016    HCT 32.3 02/15/2016    MCV 94.4 02/15/2016    MCH 31.3 02/15/2016    MCHC 33.2 02/15/2016    RDW 14.4 02/15/2016    PLT 197 02/15/2016    MPV NOT REPORTED 02/15/2016     No results found for: TSH  Lab Results   Component Value Date    CHOL 96 10/29/2014    HDL 31 10/29/2014          Assessment & Plan        Diagnosis Orders   1. Acute bacterial sinusitis   Prescribed Zithromax. Advised to use OTC saline spray.  Basic Metabolic Panel    azithromycin (ZITHROMAX) 500 MG tablet   2. Impacted cerumen of left ear   Left ear was flushed with warm water. Patient tolerated well. Reexamination showed clear ear canal, normal tympanic membrane.                    Completed Refills   Requested Prescriptions     Signed Prescriptions Disp Refills   ??? azithromycin (ZITHROMAX) 500 MG tablet 1 packet 0     Sig: Take 1 tablet by mouth daily for 3 days     Return if symptoms worsen or fail to improve.     Orders Placed This Encounter   Medications   ??? azithromycin (ZITHROMAX) 500 MG tablet     Sig: Take 1 tablet by mouth daily for 3 days     Dispense:  1 packet     Refill:  0     Orders Placed This Encounter   Procedures   ??? Basic Metabolic Panel     Standing Status:   Future     Standing Expiration Date:   09/13/2018         Patient Instructions     SURVEY:    You may be receiving a survey from [employer] regarding your visit today.    Please complete the survey to enable us to provide the highest quality of care to you and your family.    If you cannot  score us a very good on any question, please call the office to discuss how we could of made your experience a very good one.    Thank you.        Electronically signed by Damita LackGohar Milany Geck, MD on 09/12/2017 at 9:50 AM           Completed Refills      Requested Prescriptions     Signed Prescriptions Disp Refills   ??? azithromycin (ZITHROMAX) 500 MG tablet 1 packet 0     Sig: Take 1 tablet by mouth daily for 3 days

## 2017-09-18 DIAGNOSIS — M130 Polyarthritis, unspecified: Secondary | ICD-10-CM | POA: Diagnosis not present

## 2017-09-18 DIAGNOSIS — I10 Essential (primary) hypertension: Secondary | ICD-10-CM | POA: Diagnosis not present

## 2017-09-18 DIAGNOSIS — E119 Type 2 diabetes mellitus without complications: Secondary | ICD-10-CM | POA: Diagnosis not present

## 2017-09-30 ENCOUNTER — Other Ambulatory Visit: Payer: Self-pay | Admitting: Orthopedic Surgery

## 2017-09-30 DIAGNOSIS — G894 Chronic pain syndrome: Secondary | ICD-10-CM

## 2017-09-30 DIAGNOSIS — M170 Bilateral primary osteoarthritis of knee: Secondary | ICD-10-CM

## 2017-09-30 NOTE — Telephone Encounter (Signed)
Patient called for refill:  HYDROcodone-acetaminophen (NORCO) 7.5-325 MG tablet 60 tab  - [facility], [address], [place]

## 2017-10-01 MED ORDER — HYDROCODONE-ACETAMINOPHEN 7.5-325 MG PO TABS: 1.0000 | ORAL_TABLET | Freq: Four times a day (QID) | ORAL | 0 refills | Status: DC | PRN

## 2017-10-07 ENCOUNTER — Ambulatory Visit (INDEPENDENT_AMBULATORY_CARE_PROVIDER_SITE_OTHER): Payer: Medicare Other | Admitting: Orthopedic Surgery

## 2017-10-07 ENCOUNTER — Encounter: Payer: Self-pay | Admitting: Orthopedic Surgery

## 2017-10-07 VITALS — BP 151/101 | HR 80 | Ht 68.0 in | Wt 260.0 lb

## 2017-10-07 DIAGNOSIS — G894 Chronic pain syndrome: Secondary | ICD-10-CM

## 2017-10-07 DIAGNOSIS — G5603 Carpal tunnel syndrome, bilateral upper limbs: Secondary | ICD-10-CM

## 2017-10-07 DIAGNOSIS — M25512 Pain in left shoulder: Secondary | ICD-10-CM | POA: Diagnosis not present

## 2017-10-07 DIAGNOSIS — G8929 Other chronic pain: Secondary | ICD-10-CM

## 2017-10-07 DIAGNOSIS — M170 Bilateral primary osteoarthritis of knee: Secondary | ICD-10-CM

## 2017-10-07 DIAGNOSIS — M25511 Pain in right shoulder: Secondary | ICD-10-CM

## 2017-10-07 MED ORDER — HYDROCODONE-ACETAMINOPHEN 7.5-325 MG PO TABS: 1.0000 | ORAL_TABLET | Freq: Four times a day (QID) | ORAL | 0 refills | Status: DC | PRN

## 2017-10-07 NOTE — Progress Notes (Signed)
Progress Note   Patient ID: [facility], male   DOB: Nov 27, 1965, 52 y.o.   MRN: [id number]  Chief Complaint  Patient presents with  . Carpal Tunnel    bilateral     Primary osteoarthritis of both knees Chronic pain syndrome Bilateral carpal tunnel syndrome  (primary encounter diagnosis) Chronic pain of both shoulders  Primary visit today is for bilateral carpal tunnel  status post splinting.  He says that his hands feel much better the symptoms have calmed down a lot he is having no trouble using his hands at this time      Review of Systems  Neurological: Negative for focal weakness.   Current Meds  Medication Sig  . albuterol (PROAIR HFA) 108 (90 Base) MCG/ACT inhaler 2 puffs every 4 hours as needed only  if your can't catch your breath  . aspirin 81 MG tablet Take 81 mg by mouth daily.  . halobetasol (ULTRAVATE) 0.05 % cream APP AA ON SKIN ONCE D  . HYDROcodone-acetaminophen (NORCO) 7.5-325 MG tablet Take 1 tablet by mouth every 6 (six) hours as needed for moderate pain.  Marland Kitchen ibuprofen (ADVIL,MOTRIN) 800 MG tablet Take 1 tablet (800 mg total) by mouth 3 (three) times daily.  Marland Kitchen JANUMET XR [phone] MG TB24 TK 1 T PO QD  . lisinopril (PRINIVIL,ZESTRIL) 5 MG tablet Take 5 mg by mouth daily.  . ranitidine (ZANTAC) 75 MG tablet Take 75 mg by mouth 2 (two) times daily.  Marland Kitchen triamcinolone cream (KENALOG) 0.1 % APP AA BID  . [DISCONTINUED] HYDROcodone-acetaminophen (NORCO) 7.5-325 MG tablet Take 1 tablet by mouth every 6 (six) hours as needed for moderate pain.    Allergies  Allergen Reactions  . Fish Allergy Hives and Swelling  . Tomato Hives and Swelling  . Vicodin [Hydrocodone-Acetaminophen] Itching and Rash    Can take Vicodin as long as he takes benadryl      BP (!) 151/101   Pulse 80   Ht 5\' 8"  (1.727 m)   Wt 260 lb (117.9 kg)   BMI 39.53 kg/m   Physical Exam  Constitutional: He is oriented to person, place, and time. He appears well-developed and  well-nourished.  Vital signs have been reviewed and are stable. Gen. appearance the patient is well-developed and well-nourished with normal grooming and hygiene.   Neurological: He is alert and oriented to person, place, and time.  Skin: Skin is warm and dry. No erythema.  Psychiatric: He has a normal mood and affect.  Vitals reviewed.  Grip strength right to left equal there is no noticeable weakness no atrophy in the thenar or hyperthenar areas  He does have some arthritic changes in the interphalangeal joints of the hand  Medical decision-making Encounter Diagnoses  Name Primary?  . Primary osteoarthritis of both knees   . Chronic pain syndrome   . Bilateral carpal tunnel syndrome Yes  . Chronic pain of both shoulders      Knee arthritis stable with hydrocodone Chronic pain knees and shoulders controlled with hydrocodone Bilateral carpal tunnel syndrome improved Chronic pain both shoulders controlled with intermittent mitten injection and hydrocodone  Return in 3 months  Meds ordered this encounter  Medications  . HYDROcodone-acetaminophen (NORCO) 7.5-325 MG tablet    Sig: Take 1 tablet by mouth every 6 (six) hours as needed for moderate pain.    Dispense:  60 tablet    Refill:  0     Arther Abbott, MD 10/07/2017 9:37 AM

## 2017-10-10 DIAGNOSIS — L110 Acquired keratosis follicularis: Secondary | ICD-10-CM | POA: Diagnosis not present

## 2017-10-10 DIAGNOSIS — M79671 Pain in right foot: Secondary | ICD-10-CM | POA: Diagnosis not present

## 2017-10-10 DIAGNOSIS — E1140 Type 2 diabetes mellitus with diabetic neuropathy, unspecified: Secondary | ICD-10-CM | POA: Diagnosis not present

## 2017-10-10 DIAGNOSIS — M79672 Pain in left foot: Secondary | ICD-10-CM | POA: Diagnosis not present

## 2017-10-29 ENCOUNTER — Other Ambulatory Visit: Payer: Self-pay | Admitting: Orthopedic Surgery

## 2017-10-29 DIAGNOSIS — M170 Bilateral primary osteoarthritis of knee: Secondary | ICD-10-CM

## 2017-10-29 DIAGNOSIS — G894 Chronic pain syndrome: Secondary | ICD-10-CM

## 2017-10-29 MED ORDER — HYDROCODONE-ACETAMINOPHEN 7.5-325 MG PO TABS: 1.0000 | ORAL_TABLET | Freq: Four times a day (QID) | ORAL | 0 refills | Status: DC | PRN

## 2017-10-29 NOTE — Telephone Encounter (Signed)
Patient requests refill: HYDROcodone-acetaminophen (NORCO) 7.5-325 MG tablet 60 tablet 0   - [facility], [address] in [place]

## 2017-11-05 ENCOUNTER — Ambulatory Visit: Payer: Self-pay | Admitting: Nutrition

## 2017-11-18 ENCOUNTER — Other Ambulatory Visit: Payer: Self-pay | Admitting: Orthopedic Surgery

## 2017-11-18 DIAGNOSIS — G894 Chronic pain syndrome: Secondary | ICD-10-CM

## 2017-11-18 DIAGNOSIS — M170 Bilateral primary osteoarthritis of knee: Secondary | ICD-10-CM

## 2017-11-18 MED ORDER — HYDROCODONE-ACETAMINOPHEN 7.5-325 MG PO TABS: 1.0000 | ORAL_TABLET | Freq: Four times a day (QID) | ORAL | 0 refills | Status: DC | PRN

## 2017-11-18 NOTE — Telephone Encounter (Signed)
Patient requests refill on Hydrocodone/Acetaminophen 7.5-325  Mgs.  Qty  60  Sig: Take 1 tablet by mouth every 6 (six) hours as needed for moderate pain.  Patient states he uses [facility] on [address]

## 2017-11-28 ENCOUNTER — Other Ambulatory Visit: Payer: Self-pay | Admitting: Orthopedic Surgery

## 2017-11-28 DIAGNOSIS — M170 Bilateral primary osteoarthritis of knee: Secondary | ICD-10-CM

## 2017-11-28 DIAGNOSIS — G894 Chronic pain syndrome: Secondary | ICD-10-CM

## 2017-11-28 NOTE — Telephone Encounter (Signed)
Patient requests refill on Hydrocodone/Acetaminophen 7.5-325  Mgs.  Qty  60  Sig: Take 1 tablet by mouth every 6 (six) hours as needed for moderate pain.  Patient states he uses [facility] on [address]

## 2017-11-29 ENCOUNTER — Other Ambulatory Visit: Payer: Self-pay | Admitting: Internal Medicine

## 2017-11-29 MED ORDER — HYDROCODONE-ACETAMINOPHEN 7.5-325 MG PO TABS: 1.0000 | ORAL_TABLET | Freq: Four times a day (QID) | ORAL | 0 refills | Status: DC | PRN

## 2017-12-05 ENCOUNTER — Other Ambulatory Visit: Payer: Self-pay | Admitting: Orthopedic Surgery

## 2017-12-05 DIAGNOSIS — G894 Chronic pain syndrome: Secondary | ICD-10-CM

## 2017-12-05 DIAGNOSIS — M170 Bilateral primary osteoarthritis of knee: Secondary | ICD-10-CM

## 2017-12-05 MED ORDER — HYDROCODONE-ACETAMINOPHEN 7.5-325 MG PO TABS: 1.0000 | ORAL_TABLET | Freq: Four times a day (QID) | ORAL | 0 refills | Status: DC | PRN

## 2017-12-05 NOTE — Telephone Encounter (Signed)
Patient requests refill on Hydrocodone/Acetaminophen 7.5-325  Mgs.  Qty  28   Sig: Take 1 tablet by mouth every 6 (six) hours as needed for moderate pain.  Patient states he uses [facility] on [address]

## 2017-12-11 ENCOUNTER — Other Ambulatory Visit: Payer: Self-pay | Admitting: Orthopedic Surgery

## 2017-12-11 DIAGNOSIS — M170 Bilateral primary osteoarthritis of knee: Secondary | ICD-10-CM

## 2017-12-11 DIAGNOSIS — G894 Chronic pain syndrome: Secondary | ICD-10-CM

## 2017-12-11 MED ORDER — HYDROCODONE-ACETAMINOPHEN 7.5-325 MG PO TABS: 1.0000 | ORAL_TABLET | Freq: Four times a day (QID) | ORAL | 0 refills | Status: DC | PRN

## 2017-12-11 NOTE — Telephone Encounter (Signed)
Patient requests refill on Hydrocodone/Acetaminophen 7.5-325  Mgs.   Qty  28       Sig: Take 1 tablet by mouth every 6 (six) hours as needed for moderate pain.          Patient states he uses [facility] on [address]

## 2017-12-17 DIAGNOSIS — E782 Mixed hyperlipidemia: Secondary | ICD-10-CM | POA: Diagnosis not present

## 2017-12-17 DIAGNOSIS — E119 Type 2 diabetes mellitus without complications: Secondary | ICD-10-CM | POA: Diagnosis not present

## 2017-12-17 DIAGNOSIS — I10 Essential (primary) hypertension: Secondary | ICD-10-CM | POA: Diagnosis not present

## 2017-12-18 DIAGNOSIS — E118 Type 2 diabetes mellitus with unspecified complications: Secondary | ICD-10-CM | POA: Diagnosis not present

## 2017-12-19 ENCOUNTER — Other Ambulatory Visit: Payer: Self-pay | Admitting: Orthopedic Surgery

## 2017-12-19 DIAGNOSIS — M170 Bilateral primary osteoarthritis of knee: Secondary | ICD-10-CM

## 2017-12-19 DIAGNOSIS — G894 Chronic pain syndrome: Secondary | ICD-10-CM

## 2017-12-19 NOTE — Telephone Encounter (Signed)
Hydrocodone-Acetaminophen  7.5/325 MG  Qty  28 Tablets  Take 1 tablet by mouth every 6 (six) hours as needed for moderate pain.   PATIENT USES [facility] [address]

## 2017-12-20 MED ORDER — HYDROCODONE-ACETAMINOPHEN 7.5-325 MG PO TABS: 1.0000 | ORAL_TABLET | Freq: Four times a day (QID) | ORAL | 0 refills | Status: DC | PRN

## 2017-12-26 ENCOUNTER — Other Ambulatory Visit: Payer: Self-pay | Admitting: Orthopedic Surgery

## 2017-12-26 DIAGNOSIS — M170 Bilateral primary osteoarthritis of knee: Secondary | ICD-10-CM

## 2017-12-26 DIAGNOSIS — G894 Chronic pain syndrome: Secondary | ICD-10-CM

## 2017-12-26 MED ORDER — HYDROCODONE-ACETAMINOPHEN 7.5-325 MG PO TABS
1.0000 | ORAL_TABLET | Freq: Four times a day (QID) | ORAL | 0 refills | Status: DC | PRN
Start: 2017-12-26 — End: 2018-01-09

## 2017-12-26 NOTE — Telephone Encounter (Signed)
Hydrocodone-Acetaminophen  7.5/325 mg  Qty 28 Tablets  Take 1 tablet by mouth every 6(six) hours as needed for moderate pain.  PATIENT USES [facility] [place]

## 2018-01-06 ENCOUNTER — Encounter: Payer: Self-pay | Admitting: Orthopedic Surgery

## 2018-01-06 ENCOUNTER — Ambulatory Visit (INDEPENDENT_AMBULATORY_CARE_PROVIDER_SITE_OTHER): Payer: Medicare Other | Admitting: Orthopedic Surgery

## 2018-01-06 VITALS — BP 120/78 | HR 78 | Ht 68.0 in | Wt 253.0 lb

## 2018-01-06 DIAGNOSIS — M170 Bilateral primary osteoarthritis of knee: Secondary | ICD-10-CM

## 2018-01-06 DIAGNOSIS — G894 Chronic pain syndrome: Secondary | ICD-10-CM | POA: Diagnosis not present

## 2018-01-06 DIAGNOSIS — M7551 Bursitis of right shoulder: Secondary | ICD-10-CM

## 2018-01-06 DIAGNOSIS — G5603 Carpal tunnel syndrome, bilateral upper limbs: Secondary | ICD-10-CM

## 2018-01-06 DIAGNOSIS — M7552 Bursitis of left shoulder: Secondary | ICD-10-CM

## 2018-01-06 NOTE — Progress Notes (Signed)
Progress Note   Patient ID: [facility], male   DOB: [birth year], 52 y.o.   MRN: [id number] Chief Complaint  Patient presents with  . Follow-up    Recheck on knees.       Primary osteoarthritis of both knees  (primary encounter diagnosis) Chronic pain syndrome Bilateral carpal tunnel syndrome Bilateral shoulder bursitis  Routine scheduled follow-up appointment:  Terry Case comes in today he is doing reasonably well  He has bilateral knee arthritis with chronic pain from those managed with hydrocodone 7.5 mg.  His pain is well controlled with that.  He says his knees have not been any worse than usual.  His carpal tunnel syndrome is improved with less numbness and tingling and last night pain  His shoulders are not hurting him right now.      Review of Systems  Respiratory: Negative for shortness of breath.   Cardiovascular: Negative for chest pain.  Musculoskeletal: Positive for back pain.  Neurological: Positive for tingling and sensory change.   No outpatient medications have been marked as taking for the 01/06/18 encounter (Office Visit) with Carole Civil, MD.    Past Medical History:  Diagnosis Date  . Acid reflux   . Arthritis   . Asthma   . Chronic back pain   . Diabetes mellitus without complication ([place])   . Eczema   . Pneumonia      Allergies  Allergen Reactions  . Fish Allergy Hives and Swelling  . Tomato Hives and Swelling  . Vicodin [Hydrocodone-Acetaminophen] Itching and Rash    Can take Vicodin as long as he takes benadryl      BP 120/78   Pulse 78   Ht 5\' 8"  (1.727 m)   Wt 253 lb (114.8 kg)   BMI 38.47 kg/m    Physical Exam  Constitutional: He is oriented to person, place, and time. He appears well-developed and well-nourished.  Musculoskeletal:       Right knee: He exhibits no effusion.       Left knee: He exhibits no effusion.  Neurological: He is alert and oriented to person, place, and time. Gait normal.   Psychiatric: He has a normal mood and affect. His behavior is normal. Judgment and thought content normal.    Right Knee Exam   Muscle Strength  The patient has normal right knee strength.  Tenderness  The patient is experiencing no tenderness.   Range of Motion  Extension:  10 normal  Flexion:  110 normal   Tests  McMurray:  Medial - negative Lateral - negative Varus: negative Valgus: negative Drawer:  Anterior - negative    Posterior - negative  Other  Erythema: absent Scars: absent Sensation: normal Pulse: present Swelling: none Effusion: no effusion present   Left Knee Exam   Muscle Strength  The patient has normal left knee strength.  Tenderness  The patient is experiencing no tenderness.   Range of Motion  Extension:  10 normal  Flexion:  100 normal   Tests  McMurray:  Medial - negative Lateral - negative Varus: negative Valgus: negative Drawer:  Anterior - negative     Posterior - negative  Other  Erythema: absent Scars: absent Sensation: normal Pulse: present Swelling: none Effusion: no effusion present      MEDICAL DECISION MAKING   Imaging:  None   Last x-ray was 2017 he had severe arthritis in both knees at that time  NEW PROBLEM  Encounter Diagnoses  Name Primary?  . Primary  osteoarthritis of both knees Yes  . Chronic pain syndrome   . Bilateral carpal tunnel syndrome   . Bilateral shoulder bursitis      PLAN: (RX., injection, surgery,frx,mri/ct, XR 2 body ares) Osteoarthritis, recommend continue with his hydrocodone as it is controlling his pain and he has good pain scores and no history of abuse or signs of abuse or overuse  Carpal tunnel syndrome continue nighttime bracing Bilateral shoulder bursitis avoid activities which include overuse and bring on pain Chronic pain controlled with hydrocodone  Follow-up in 6 months bilateral knee x-rays   NARX SCORES  Narcotic  370  Sedative  140  Stimulant  000   Explanation and Guidance  OVERDOSE RISK SCORE  100  (Range 000-999)  Explanation and Guidance  ADDITIONAL RISK INDICATORS ( 0 )  Explanation and Guidance   Summary  Total Prescriptions: 43  Total Prescribers: 4  Total Pharmacies: 1  Narcotics*  (excluding buprenorphine) Current Qty: 0  Current MME/day: 0.00  30 Day Avg MME/day: 26.00  Sedatives*  Current Qty: 0  Current LME/day: 0.00  30 Day Avg LME/day: 0.00  Buprenorphine*  Current Qty: 0  Current mg/day: 0.00  30 Day Avg mg/day: 0.00  Rx Data  PRESCRIPTIONS  Total Prescriptions: 43  Total Private Pay: 0   Fill Date ID Written Drug Qty Days Prescriber Rx # Pharmacy Refill Daily Dose * Pymt Type PMP  12/27/2017  2  12/26/2017  Hydrocodone-Acetamin 7.5-325   [address]  [id number]  [facility] ([id number]  0 30.00 MME Medicare  [place]  12/20/2017  2  12/20/2017  Hydrocodone-Acetamin 7.5-325   [address]  [id number]  [facility] (0019)  0 30.00 MME Medicare  [place]  12/12/2017  2  12/11/2017  Hydrocodone-Acetamin 7.5-325   [address]  [id number]  [facility] (0019)  0 30.00 MME Medicare  [place]  12/05/2017  2  12/05/2017  Hydrocodone-Acetamin 7.5-325   [address]  [id number]  [facility] (0019)  0 30.00 MME Medicare  [employer]    No orders of the defined types were placed in this encounter.  10:17 AM 01/06/2018

## 2018-01-09 ENCOUNTER — Other Ambulatory Visit: Payer: Self-pay | Admitting: Orthopedic Surgery

## 2018-01-09 DIAGNOSIS — M170 Bilateral primary osteoarthritis of knee: Secondary | ICD-10-CM

## 2018-01-09 DIAGNOSIS — G894 Chronic pain syndrome: Secondary | ICD-10-CM

## 2018-01-09 DIAGNOSIS — M79671 Pain in right foot: Secondary | ICD-10-CM | POA: Diagnosis not present

## 2018-01-09 DIAGNOSIS — L110 Acquired keratosis follicularis: Secondary | ICD-10-CM | POA: Diagnosis not present

## 2018-01-09 DIAGNOSIS — E1140 Type 2 diabetes mellitus with diabetic neuropathy, unspecified: Secondary | ICD-10-CM | POA: Diagnosis not present

## 2018-01-09 DIAGNOSIS — M79672 Pain in left foot: Secondary | ICD-10-CM | POA: Diagnosis not present

## 2018-01-09 MED ORDER — HYDROCODONE-ACETAMINOPHEN 7.5-325 MG PO TABS: 1.0000 | ORAL_TABLET | Freq: Four times a day (QID) | ORAL | 0 refills | Status: DC | PRN

## 2018-01-09 NOTE — Telephone Encounter (Signed)
Patient requests refill on Hydrocodone/Acetaminophen 7.5-325 mgs. Qty  28  Sig: Take 1 tablet by mouth every 6 (six) hours as needed for moderate pain.  Patient states he uses [facility] on [address]

## 2018-01-16 ENCOUNTER — Other Ambulatory Visit: Payer: Self-pay | Admitting: Orthopedic Surgery

## 2018-01-16 DIAGNOSIS — G894 Chronic pain syndrome: Secondary | ICD-10-CM

## 2018-01-16 DIAGNOSIS — M170 Bilateral primary osteoarthritis of knee: Secondary | ICD-10-CM

## 2018-01-16 MED ORDER — HYDROCODONE-ACETAMINOPHEN 7.5-325 MG PO TABS: 1.0000 | ORAL_TABLET | Freq: Four times a day (QID) | ORAL | 0 refills | Status: DC | PRN

## 2018-01-16 NOTE — Telephone Encounter (Signed)
Hydrocodone-Acetaminophen  7.5/325 mg  Qty 28 Tablets  Take 1 tablet by mouth every 6 (six) hours as needed for moderate pain.   [facility] [address]

## 2018-01-23 ENCOUNTER — Other Ambulatory Visit: Payer: Self-pay | Admitting: Orthopedic Surgery

## 2018-01-23 DIAGNOSIS — M170 Bilateral primary osteoarthritis of knee: Secondary | ICD-10-CM

## 2018-01-23 DIAGNOSIS — G894 Chronic pain syndrome: Secondary | ICD-10-CM

## 2018-01-23 MED ORDER — HYDROCODONE-ACETAMINOPHEN 7.5-325 MG PO TABS: 1.0000 | ORAL_TABLET | Freq: Four times a day (QID) | ORAL | 0 refills | Status: DC | PRN

## 2018-01-23 NOTE — Telephone Encounter (Signed)
Patient called to request refill: HYDROcodone-acetaminophen (NORCO) 7.5-325 MG tablet - [facility], [address]

## 2018-01-27 ENCOUNTER — Encounter: Payer: BLUE CROSS/BLUE SHIELD | Attending: Internal Medicine | Primary: Internal Medicine

## 2018-01-30 ENCOUNTER — Telehealth: Payer: Self-pay | Admitting: Orthopedic Surgery

## 2018-01-30 DIAGNOSIS — M170 Bilateral primary osteoarthritis of knee: Secondary | ICD-10-CM

## 2018-01-30 DIAGNOSIS — G894 Chronic pain syndrome: Secondary | ICD-10-CM

## 2018-01-30 NOTE — Telephone Encounter (Signed)
Dr. Ruthe Mannan patient. Hydrocodone-Acetaminophen 7.5/325 mg Qty 28 Tablets  Take 1 tablet by mouth every 6 (six) hours as needed for moderate pain.  PATIENT USES [facility] ON [address]

## 2018-02-03 MED ORDER — HYDROCODONE-ACETAMINOPHEN 7.5-325 MG PO TABS: 1.0000 | ORAL_TABLET | Freq: Four times a day (QID) | ORAL | 0 refills | Status: DC | PRN

## 2018-02-07 ENCOUNTER — Telehealth: Payer: Self-pay | Admitting: Orthopedic Surgery

## 2018-02-11 ENCOUNTER — Other Ambulatory Visit: Payer: Self-pay | Admitting: Orthopedic Surgery

## 2018-02-11 DIAGNOSIS — M170 Bilateral primary osteoarthritis of knee: Secondary | ICD-10-CM

## 2018-02-11 DIAGNOSIS — G894 Chronic pain syndrome: Secondary | ICD-10-CM

## 2018-02-11 NOTE — Telephone Encounter (Signed)
Patient requests refill on Hydrocodone/Acetaminophen 7.5-325 mgs.  Qty  28  Sig: Take 1 tablet by mouth every 6 (six) hours as needed for moderate pain.  Patient uses [facility] on [address]

## 2018-02-13 MED ORDER — HYDROCODONE-ACETAMINOPHEN 7.5-325 MG PO TABS: 1.0000 | ORAL_TABLET | Freq: Four times a day (QID) | ORAL | 0 refills | Status: DC | PRN

## 2018-02-17 DIAGNOSIS — I10 Essential (primary) hypertension: Secondary | ICD-10-CM | POA: Diagnosis not present

## 2018-02-17 DIAGNOSIS — E118 Type 2 diabetes mellitus with unspecified complications: Secondary | ICD-10-CM | POA: Diagnosis not present

## 2018-02-20 ENCOUNTER — Other Ambulatory Visit: Payer: Self-pay | Admitting: Orthopedic Surgery

## 2018-02-20 DIAGNOSIS — G894 Chronic pain syndrome: Secondary | ICD-10-CM

## 2018-02-20 DIAGNOSIS — M170 Bilateral primary osteoarthritis of knee: Secondary | ICD-10-CM

## 2018-02-20 NOTE — Telephone Encounter (Signed)
Patient requests refill:  HYDROcodone-acetaminophen (NORCO) 7.5-325 MG tablet 28 tablet   [facility], [facility] [facility]

## 2018-02-21 MED ORDER — HYDROCODONE-ACETAMINOPHEN 7.5-325 MG PO TABS: 1.0000 | ORAL_TABLET | Freq: Four times a day (QID) | ORAL | 0 refills | Status: DC | PRN

## 2018-02-27 ENCOUNTER — Other Ambulatory Visit: Payer: Self-pay | Admitting: Orthopedic Surgery

## 2018-02-27 DIAGNOSIS — M170 Bilateral primary osteoarthritis of knee: Secondary | ICD-10-CM

## 2018-02-27 DIAGNOSIS — G894 Chronic pain syndrome: Secondary | ICD-10-CM

## 2018-02-27 NOTE — Telephone Encounter (Signed)
Patient requests refill:  HYDROcodone-acetaminophen (NORCO) 7.5-325 MG tablet 28 tablet  - [place]    - also, please advise if patient will need to be seen in October, Vs waiting till appt scheduled for January of 2020 (due to pain medication)

## 2018-02-28 MED ORDER — HYDROCODONE-ACETAMINOPHEN 7.5-325 MG PO TABS: 1.0000 | ORAL_TABLET | Freq: Four times a day (QID) | ORAL | 0 refills | Status: AC | PRN

## 2018-03-06 ENCOUNTER — Telehealth: Payer: Self-pay | Admitting: Orthopedic Surgery

## 2018-03-06 NOTE — Telephone Encounter (Signed)
Patient requests refill on Hydrocodone/Acetaminophen 7.5-325  Mgs.   Qty  28  Sig: Take 1 tablet by mouth every 6 (six) hours as needed for up to 7 days for moderate pain.  [facility] [address].

## 2018-03-12 ENCOUNTER — Other Ambulatory Visit: Payer: Self-pay | Admitting: Orthopedic Surgery

## 2018-03-12 DIAGNOSIS — G894 Chronic pain syndrome: Secondary | ICD-10-CM

## 2018-03-12 MED ORDER — HYDROCODONE-ACETAMINOPHEN 5-325 MG PO TABS: 1.0000 | ORAL_TABLET | Freq: Four times a day (QID) | ORAL | 0 refills | Status: DC | PRN

## 2018-03-12 NOTE — Telephone Encounter (Signed)
Re-routing to Dr Aline Brochure per Amy. Patient states refill had not been done from original request 03/06/18

## 2018-03-12 NOTE — Telephone Encounter (Signed)
I cant refill

## 2018-03-12 NOTE — Telephone Encounter (Signed)
Please send other format

## 2018-03-14 ENCOUNTER — Emergency Department (HOSPITAL_COMMUNITY)
Admission: EM | Admit: 2018-03-14 | Discharge: 2018-03-14 | Disposition: A | Discharge status: Home / Self Care | Payer: Medicare Other | Attending: Emergency Medicine | Admitting: Emergency Medicine

## 2018-03-14 ENCOUNTER — Encounter (HOSPITAL_COMMUNITY): Payer: Self-pay | Admitting: *Deleted

## 2018-03-14 ENCOUNTER — Other Ambulatory Visit: Payer: Self-pay

## 2018-03-14 DIAGNOSIS — J449 Chronic obstructive pulmonary disease, unspecified: Secondary | ICD-10-CM | POA: Insufficient documentation

## 2018-03-14 DIAGNOSIS — Z7982 Long term (current) use of aspirin: Secondary | ICD-10-CM | POA: Insufficient documentation

## 2018-03-14 DIAGNOSIS — Z79899 Other long term (current) drug therapy: Secondary | ICD-10-CM | POA: Diagnosis not present

## 2018-03-14 DIAGNOSIS — I10 Essential (primary) hypertension: Secondary | ICD-10-CM | POA: Insufficient documentation

## 2018-03-14 DIAGNOSIS — Z87891 Personal history of nicotine dependence: Secondary | ICD-10-CM | POA: Insufficient documentation

## 2018-03-14 DIAGNOSIS — R51 Headache: Secondary | ICD-10-CM | POA: Diagnosis not present

## 2018-03-14 DIAGNOSIS — R519 Headache, unspecified: Secondary | ICD-10-CM

## 2018-03-14 DIAGNOSIS — G4489 Other headache syndrome: Secondary | ICD-10-CM | POA: Diagnosis not present

## 2018-03-14 DIAGNOSIS — E119 Type 2 diabetes mellitus without complications: Secondary | ICD-10-CM | POA: Insufficient documentation

## 2018-03-14 HISTORY — DX: Essential (primary) hypertension: I10

## 2018-03-14 LAB — BASIC METABOLIC PANEL
Anion gap: 7 (ref 5–15)
BUN: 24 mg/dL — AB (ref 6–20)
CO2: 26 mmol/L (ref 22–32)
Calcium: 9.1 mg/dL (ref 8.9–10.3)
Chloride: 106 mmol/L (ref 98–111)
Creatinine, Ser: 1.24 mg/dL (ref 0.61–1.24)
GFR calc Af Amer: >60 mL/min (ref >60)
GLUCOSE: 109 mg/dL — AB (ref 70–99)
POTASSIUM: 3.8 mmol/L (ref 3.5–5.1)
Sodium: 139 mmol/L (ref 135–145)

## 2018-03-14 LAB — CBC WITH DIFFERENTIAL/PLATELET
Basophils Absolute: 0 10*3/uL (ref 0.0–0.1)
Basophils Relative: 0 %
Eosinophils Absolute: 0.2 10*3/uL (ref 0.0–0.7)
Eosinophils Relative: 3 %
HCT: 38 % — ABNORMAL LOW (ref 39.0–52.0)
Hemoglobin: 12.1 g/dL — ABNORMAL LOW (ref 13.0–17.0)
LYMPHS ABS: 1.6 10*3/uL (ref 0.7–4.0)
LYMPHS PCT: 30 %
MCH: 26.9 pg (ref 26.0–34.0)
MCHC: 31.8 g/dL (ref 30.0–36.0)
MCV: 84.4 fL (ref 78.0–100.0)
MONO ABS: 0.3 10*3/uL (ref 0.1–1.0)
MONOS PCT: 6 %
Neutro Abs: 3.2 10*3/uL (ref 1.7–7.7)
Neutrophils Relative %: 61 %
Platelets: 228 10*3/uL (ref 150–400)
RBC: 4.5 MIL/uL (ref 4.22–5.81)
RDW: 14.1 % (ref 11.5–15.5)
WBC: 5.3 10*3/uL (ref 4.0–10.5)

## 2018-03-14 MED ORDER — METOCLOPRAMIDE HCL 5 MG/ML IJ SOLN
10.0000 mg | Freq: Once | INTRAMUSCULAR | Status: AC
  Administered 2018-03-14: 10 mg via INTRAVENOUS
  Filled 2018-03-14: qty 2

## 2018-03-14 MED ORDER — LORAZEPAM 1 MG PO TABS
1.0000 mg | ORAL_TABLET | Freq: Once | ORAL | Status: AC
  Administered 2018-03-14: 1 mg via ORAL
  Filled 2018-03-14: qty 1

## 2018-03-14 MED ORDER — DIPHENHYDRAMINE HCL 50 MG/ML IJ SOLN
25.0000 mg | Freq: Once | INTRAMUSCULAR | Status: AC
  Administered 2018-03-14: 25 mg via INTRAVENOUS
  Filled 2018-03-14: qty 1

## 2018-03-14 NOTE — Discharge Instructions (Addendum)
Follow-up with your primary doctor for recheck. Return here for any worsening symptoms
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
## 2018-03-14 NOTE — ED Triage Notes (Signed)
Pt c/o headache that started a few hours ago, denies any n/v, denies any sensitivity to light but reports that sound does bother the headache,

## 2018-03-14 NOTE — ED Notes (Signed)
Patient stated that his head feels like its going to explode. Scared that he has had a stroke.

## 2018-03-17 ENCOUNTER — Other Ambulatory Visit: Payer: Self-pay | Admitting: Orthopedic Surgery

## 2018-03-17 DIAGNOSIS — G894 Chronic pain syndrome: Secondary | ICD-10-CM

## 2018-03-17 MED ORDER — HYDROCODONE-ACETAMINOPHEN 5-325 MG PO TABS: 1.0000 | ORAL_TABLET | Freq: Four times a day (QID) | ORAL | 0 refills | Status: DC | PRN

## 2018-03-17 NOTE — Telephone Encounter (Signed)
03/17/18 Re-sending, as per Dr Ruthe Mannan reply: Refill medication:  HYDROcodone-acetaminophen (NORCO/VICODIN) 5-325 MG tablet 56 tablet  -[employer], [place], [place]

## 2018-03-20 ENCOUNTER — Other Ambulatory Visit: Payer: Self-pay | Admitting: Orthopedic Surgery

## 2018-03-20 DIAGNOSIS — G894 Chronic pain syndrome: Secondary | ICD-10-CM

## 2018-03-20 NOTE — Telephone Encounter (Signed)
Hydrocodone-Acetaminophen 5/325 mg  Qty 56 Tablets  Take 1 tablet by mouth every 6 (six) hours as needed for moderate pain.  PATIENT  USES [facility] ON [address] 

## 2018-03-21 MED ORDER — HYDROCODONE-ACETAMINOPHEN 5-325 MG PO TABS: 1.0000 | ORAL_TABLET | Freq: Four times a day (QID) | ORAL | 0 refills | Status: DC | PRN

## 2018-03-22 DIAGNOSIS — R21 Rash and other nonspecific skin eruption: Secondary | ICD-10-CM | POA: Diagnosis not present

## 2018-03-27 DIAGNOSIS — L110 Acquired keratosis follicularis: Secondary | ICD-10-CM | POA: Diagnosis not present

## 2018-03-27 DIAGNOSIS — M79672 Pain in left foot: Secondary | ICD-10-CM | POA: Diagnosis not present

## 2018-03-27 DIAGNOSIS — E1140 Type 2 diabetes mellitus with diabetic neuropathy, unspecified: Secondary | ICD-10-CM | POA: Diagnosis not present

## 2018-03-27 DIAGNOSIS — M79671 Pain in right foot: Secondary | ICD-10-CM | POA: Diagnosis not present

## 2018-04-08 ENCOUNTER — Telehealth: Payer: Self-pay | Admitting: Orthopedic Surgery

## 2018-04-08 NOTE — Telephone Encounter (Signed)
Call received from patient for refill: HYDROcodone-acetaminophen (NORCO/VICODIN) 5-325 MG tablet 56 tablet 0   - [employer] [facility], [employer]

## 2018-04-09 ENCOUNTER — Other Ambulatory Visit: Payer: Self-pay | Admitting: Orthopedic Surgery

## 2018-04-09 DIAGNOSIS — G894 Chronic pain syndrome: Secondary | ICD-10-CM

## 2018-04-09 MED ORDER — HYDROCODONE-ACETAMINOPHEN 5-325 MG PO TABS: 1.0000 | ORAL_TABLET | Freq: Four times a day (QID) | ORAL | 0 refills | Status: DC | PRN

## 2018-04-09 NOTE — Progress Notes (Signed)
[email or web address] search completed   

## 2018-04-21 ENCOUNTER — Encounter: Payer: Self-pay | Admitting: Gastroenterology

## 2018-04-21 ENCOUNTER — Other Ambulatory Visit: Payer: Self-pay | Admitting: Internal Medicine

## 2018-04-22 DIAGNOSIS — M542 Cervicalgia: Secondary | ICD-10-CM | POA: Diagnosis not present

## 2018-04-22 DIAGNOSIS — Z23 Encounter for immunization: Secondary | ICD-10-CM | POA: Diagnosis not present

## 2018-04-24 ENCOUNTER — Telehealth: Payer: Self-pay | Admitting: Internal Medicine

## 2018-04-24 NOTE — Telephone Encounter (Signed)
Attempted to contact pt. I did not receive an answer. I have left a message for pt to return our call.   He would need an OV before a refill can be given.

## 2018-04-25 NOTE — Telephone Encounter (Signed)
Called and spoke with pt who stated he was needing a refill of his albuterol inhaler.  I stated to pt that we would need him to come in for an OV before we could refill any meds. Pt expressed understanding.  OV has been scheduled for pt Monday, 10/28 at 9am. Nothing further needed.

## 2018-04-28 ENCOUNTER — Ambulatory Visit (INDEPENDENT_AMBULATORY_CARE_PROVIDER_SITE_OTHER): Payer: Medicare Other | Admitting: Pulmonary Disease

## 2018-04-28 ENCOUNTER — Encounter: Payer: Self-pay | Admitting: Pulmonary Disease

## 2018-04-28 VITALS — BP 132/78 | HR 92 | Ht 68.0 in | Wt 266.0 lb

## 2018-04-28 DIAGNOSIS — K219 Gastro-esophageal reflux disease without esophagitis: Secondary | ICD-10-CM

## 2018-04-28 DIAGNOSIS — J449 Chronic obstructive pulmonary disease, unspecified: Secondary | ICD-10-CM

## 2018-04-28 DIAGNOSIS — G4733 Obstructive sleep apnea (adult) (pediatric): Secondary | ICD-10-CM | POA: Diagnosis not present

## 2018-04-28 MED ORDER — ALBUTEROL SULFATE HFA 108 (90 BASE) MCG/ACT IN AERS: INHALATION_SPRAY | RESPIRATORY_TRACT | 3 refills | Status: DC

## 2018-04-28 NOTE — Assessment & Plan Note (Signed)
Patient failed CPAP therapy  Patient to continue to work on actively trying to lose weight  Patient interested in oral appliance will refer to Dr. Ron Parker for further evaluation

## 2018-04-28 NOTE — Assessment & Plan Note (Signed)
Continue Zantac daily as managed by PCP Take 30 minutes to an hour before each meal  Review GERD literature provided today on diet and foods to avoid  If you are having more breakthrough acid reflux and heartburn symptoms please contact PCP or our office

## 2018-04-28 NOTE — Progress Notes (Signed)
Chart and office note reviewed in detail  > agree with a/p as outlined    

## 2018-04-28 NOTE — Assessment & Plan Note (Signed)
Refilled rescue inhaler today  Only use your albuterol as a rescue medication to be used if you can't catch your breath by resting or doing a relaxed purse lip breathing pattern.  - The less you use it, the better it will work when you need it. - Ok to use up to 2 puffs  every 4 hours if you must but call for immediate appointment if use goes up over your usual need - Don't leave home without it !!  (think of it like the spare tire for your car)   Follow-up in 1 year or sooner if rescue inhaler is being used more often

## 2018-04-28 NOTE — Progress Notes (Signed)
@Patient  ID: Terry Case, male    DOB: 10/22/1965, 52 y.o.   MRN: [id number]  Chief Complaint  Patient presents with  . Follow-up    Referring provider: Iona Beard, MD  HPI:  52 year old male former smoker followed in our office for cough, COPD with AB  PMH: Hypertension, obstructive sleep apnea (failed CPAP) Smoker/ Smoking History: Former Smoker. 60 pack year history. Maintenance:  none Pt of: Wert  04/28/2018  - Visit   52 year old male patient presenting today for follow-up visit.  Patient reports that he needs a refill of his rescue inhaler.  He recently lost his rescue inhaler when he went to get it refilled this prompted this appointment.  In the meantime patient has been using his girlfriend's rescue inhaler.  Patient reports that he only needs to use his rescue inhaler 2-3 times a week if he is sick.  Patient reports that when he is sick he has to use the rescue inhaler about 2-3 times a week.  When he does not have acute symptoms he does not need to use it.  Chart review revealed patient failed CPAP therapy in the past.  Patient is currently trying to manage this with weight loss.  Weight has been relatively stable in previous office visits.  Patient is still interested in other devices to help manage his sleep apnea.   Tests:  07/27/16-chest x-ray-both lungs are clear, no active cardiopulmonary disease 08/23/2016-spirometry- FVC 2.9 (76% predicted), ratio 78, FEV1 75 >>>mild restriction  Per previous OV notes >>>sleep study in 2010 that showed moderate sleep apnea, with an AHI of 28 events per hour. 12/26/2008- CPAP titration- CPAP pressure 13 >>>failed therapy   FENO:  No results found for: NITRICOXIDE  PFT: No flowsheet data found.  Imaging: No results found.  Chart Review:  Chart review reveals that patient was initially treated for obstructive sleep apnea with CPAP but could not tolerate the device.  Patient to continue work on losing  weight   Specialty Problems      Pulmonary Problems   OSA (obstructive sleep apnea)    NPSG 2010:  AHI 28/hr, cpap to 13cm.       COPD GOLD 0/ AB    08/23/2016  After extensive coaching HFA effectiveness =    75% from baseline 25% - Spirometry 08/23/2016  Very minimal obst on f/v curve > pulmonary f/u prn          Allergies  Allergen Reactions  . Fish Allergy Hives and Swelling  . Tomato Hives and Swelling  . Vicodin [Hydrocodone-Acetaminophen] Itching and Rash    Can take Vicodin as long as he takes benadryl     Immunization History  Administered Date(s) Administered  . Influenza Whole 05/02/2016, 04/22/2018  . Tdap 07/27/2016   Already received flu vaccine   Past Medical History:  Diagnosis Date  . Acid reflux   . Arthritis   . Asthma   . Chronic back pain   . Diabetes mellitus without complication ([place])   . Eczema   . Hypertension   . Pneumonia     Tobacco History: Social History   Tobacco Use  Smoking Status Former Smoker  . Packs/day: 2.00  . Years: 30.00  . Pack years: 60.00  . Types: Cigarettes  . Last attempt to quit: 09/26/2011  . Years since quitting: 6.5  Smokeless Tobacco Never Used   Counseling given: Not Answered  Continue not smoking  Outpatient Encounter Medications as of 04/28/2018  Medication Sig  .  albuterol (PROVENTIL HFA;VENTOLIN HFA) 108 (90 Base) MCG/ACT inhaler INHALE 2 PUFFS BY MOUTH EVERY 4 HOURS AS NEEDED IF YOU CAN'T CATCH YOUR BREATH  . aspirin 81 MG tablet Take 81 mg by mouth daily.  . halobetasol (ULTRAVATE) 0.05 % cream APP AA ON SKIN ONCE D  . HYDROcodone-acetaminophen (NORCO/VICODIN) 5-325 MG tablet Take 1 tablet by mouth every 6 (six) hours as needed for moderate pain.  Marland Kitchen ibuprofen (ADVIL,MOTRIN) 800 MG tablet Take 1 tablet (800 mg total) by mouth 3 (three) times daily.  Marland Kitchen JANUMET XR [phone] MG TB24 TK 1 T PO QD  . lisinopril (PRINIVIL,ZESTRIL) 5 MG tablet Take 5 mg by mouth daily.  . ranitidine (ZANTAC) 75 MG  tablet Take 75 mg by mouth 2 (two) times daily.  Marland Kitchen triamcinolone cream (KENALOG) 0.1 % APP AA BID  . [DISCONTINUED] albuterol (PROVENTIL HFA;VENTOLIN HFA) 108 (90 Base) MCG/ACT inhaler INHALE 2 PUFFS BY MOUTH EVERY 4 HOURS AS NEEDED IF YOU CAN'T CATCH YOUR BREATH   No facility-administered encounter medications on file as of 04/28/2018.      Review of Systems  Review of Systems  Constitutional: Negative for activity change, chills, fatigue, fever and unexpected weight change.  HENT: Negative for congestion (Recent congestion last week, treated with Mucinex over-the-counter did well), postnasal drip, rhinorrhea, sinus pressure, sneezing and sore throat.   Eyes: Negative.   Respiratory: Negative for cough, shortness of breath and wheezing.   Cardiovascular: Negative for chest pain and palpitations.  Gastrointestinal: Negative for constipation, diarrhea, nausea and vomiting.  Endocrine: Negative.   Musculoskeletal: Negative.   Skin: Negative.   Allergic/Immunologic: Negative for environmental allergies.  Neurological: Negative for dizziness and headaches.  Psychiatric/Behavioral: Negative.  Negative for dysphoric mood. The patient is not nervous/anxious.   All other systems reviewed and are negative.    Physical Exam  BP 132/78 (BP Location: Left Arm, Cuff Size: Normal)   Pulse 92   Ht 5\' 8"  (1.727 m)   Wt 266 lb (120.7 kg)   SpO2 95%   BMI 40.45 kg/m   Wt Readings from Last 5 Encounters:  04/28/18 266 lb (120.7 kg)  03/14/18 264 lb (119.7 kg)  01/06/18 253 lb (114.8 kg)  10/07/17 260 lb (117.9 kg)  08/26/17 270 lb (122.5 kg)    Physical Exam  Constitutional: He is oriented to person, place, and time and well-developed, well-nourished, and in no distress. No distress.  HENT:  Head: Normocephalic and atraumatic.  Right Ear: Hearing, tympanic membrane, external ear and ear canal normal.  Left Ear: Hearing and external ear normal.  Nose: Nose normal. Right sinus exhibits  no maxillary sinus tenderness and no frontal sinus tenderness. Left sinus exhibits no maxillary sinus tenderness and no frontal sinus tenderness.  Mouth/Throat: Uvula is midline and oropharynx is clear and moist. No oropharyngeal exudate.  Mallampati 3, left ear canal completely occluded with cerumen, unable to visualize left TM  Eyes: Pupils are equal, round, and reactive to light.  Neck: Normal range of motion. Neck supple. No JVD present.  Cardiovascular: Normal rate, regular rhythm and normal heart sounds.  Pulmonary/Chest: Effort normal and breath sounds normal. No accessory muscle usage. No respiratory distress. He has no decreased breath sounds. He has no wheezes. He has no rhonchi.  Abdominal: Soft. Bowel sounds are normal. There is no tenderness.  Musculoskeletal: Normal range of motion. He exhibits no edema.  Lymphadenopathy:    He has no cervical adenopathy.  Neurological: He is alert and oriented to person, place,  and time. Gait normal.  Skin: Skin is warm and dry. He is not diaphoretic. No erythema.  Psychiatric: Mood, memory, affect and judgment normal.  Nursing note and vitals reviewed.    Lab Results:  CBC    Component Value Date/Time   WBC 5.3 03/14/2018 2144   RBC 4.50 03/14/2018 2144   HGB 12.1 (L) 03/14/2018 2144   HCT 38.0 (L) 03/14/2018 2144   PLT 228 03/14/2018 2144   MCV 84.4 03/14/2018 2144   MCH 26.9 03/14/2018 2144   MCHC 31.8 03/14/2018 2144   RDW 14.1 03/14/2018 2144   LYMPHSABS 1.6 03/14/2018 2144   MONOABS 0.3 03/14/2018 2144   EOSABS 0.2 03/14/2018 2144   BASOSABS 0.0 03/14/2018 2144    BMET    Component Value Date/Time   NA 139 03/14/2018 2144   K 3.8 03/14/2018 2144   CL 106 03/14/2018 2144   CO2 26 03/14/2018 2144   GLUCOSE 109 (H) 03/14/2018 2144   BUN 24 (H) 03/14/2018 2144   CREATININE 1.24 03/14/2018 2144   CALCIUM 9.1 03/14/2018 2144   GFRNONAA >60 03/14/2018 2144   GFRAA >60 03/14/2018 2144    BNP No results found for:  BNP  ProBNP    Component Value Date/Time   PROBNP 13.2 04/21/2013 0857      Assessment & Plan:   Pleasant 52 year old patient completing follow-up office visit today.  Patient doing well.  Will refill rescue inhaler.  Will refer patient to Dr. Ron Parker for potential oral appliance for management of obstructive sleep apnea.  Patient failed CPAP therapy and also is not interested in obstructive sleep apnea implant.   Follow-up in 1 year or sooner if symptoms worsen or having to use her rescue inhaler more often than usual.  COPD GOLD 0/ AB Refilled rescue inhaler today  Only use your albuterol as a rescue medication to be used if you can't catch your breath by resting or doing a relaxed purse lip breathing pattern.  - The less you use it, the better it will work when you need it. - Ok to use up to 2 puffs  every 4 hours if you must but call for immediate appointment if use goes up over your usual need - Don't leave home without it !!  (think of it like the spare tire for your car)   Follow-up in 1 year or sooner if rescue inhaler is being used more often  OSA (obstructive sleep apnea) Patient failed CPAP therapy  Patient to continue to work on actively trying to lose weight  Patient interested in oral appliance will refer to Dr. Ron Parker for further evaluation    GERD (gastroesophageal reflux disease) Continue Zantac daily as managed by PCP Take 30 minutes to an hour before each meal  Review GERD literature provided today on diet and foods to avoid  If you are having more breakthrough acid reflux and heartburn symptoms please contact PCP or our office     Lauraine Rinne, NP 04/28/2018

## 2018-04-28 NOTE — Patient Instructions (Signed)
We will refill your rescue inhaler today  Only use your albuterol as a rescue medication to be used if you can't catch your breath by resting or doing a relaxed purse lip breathing pattern.  - The less you use it, the better it will work when you need it. - Ok to use up to 2 puffs  every 4 hours if you must but call for immediate appointment if use goes up over your usual need - Don't leave home without it !!  (think of it like the spare tire for your car)    We will refer you to Dr. Ron Parker for your oral appliance for management of your obstructive sleep apnea  [address] [place], [place] [address] Phone: [phone]   You will need to have six-month follow-up with Dr. Ron Parker or your dentist to ensure your proper dentition and no issues with oral appliance.   We will also need to have you follow-up in 6 months with our office with a home sleep study to ensure that your sleep apnea is being treated adequately.   Follow-up in 1 year or sooner If you have to use your rescue inhaler more often than usual please contact our office   November/2019 we will be moving! We will no longer be at our [place] location.  Be on the look out for a post card/mailer to let you know we have officially moved.  Our new address and phone number will be:  [address] [place], [place] [address] Telephone number: [phone]  It is flu season:   >>>Remember to be washing your hands regularly, using hand sanitizer, be careful to use around herself with has contact with people who are sick will increase her chances of getting sick yourself. >>> Best ways to protect herself from the flu: Receive the yearly flu vaccine, practice good hand hygiene washing with soap and also using hand sanitizer when available, eat a nutritious meals, get adequate rest, hydrate appropriately   Please contact the office if your symptoms worsen or you have concerns that you are not  improving.   Thank you for choosing [facility] for your healthcare, and for allowing [place] to partner with you on your healthcare journey. I am thankful to be able to provide care to you today.   Terry Quaker FNP-C    Food Choices for Gastroesophageal Reflux Disease, Adult When you have gastroesophageal reflux disease (GERD), the foods you eat and your eating habits are very important. Choosing the right foods can help ease your discomfort. What guidelines do I need to follow?  Choose fruits, vegetables, whole grains, and low-fat dairy products.  Choose low-fat meat, fish, and poultry.  Limit fats such as oils, salad dressings, butter, nuts, and avocado.  Keep a food diary. This helps you identify foods that cause symptoms.  Avoid foods that cause symptoms. These may be different for everyone.  Eat small meals often instead of 3 large meals a day.  Eat your meals slowly, in a place where you are relaxed.  Limit fried foods.  Cook foods using methods other than frying.  Avoid drinking alcohol.  Avoid drinking large amounts of liquids with your meals.  Avoid bending over or lying down until 2-3 hours after eating. What foods are not recommended? These are some foods and drinks that may make your symptoms worse: Vegetables Tomatoes. Tomato juice. Tomato and spaghetti sauce. Chili peppers. Onion and garlic. Horseradish. Fruits Oranges,  grapefruit, and lemon (fruit and juice). Meats High-fat meats, fish, and poultry. This includes hot dogs, ribs, ham, sausage, salami, and bacon. Dairy Whole milk and chocolate milk. Sour cream. Cream. Butter. Ice cream. Cream cheese. Drinks Coffee and tea. Bubbly (carbonated) drinks or energy drinks. Condiments Hot sauce. Barbecue sauce. Sweets/Desserts Chocolate and cocoa. Donuts. Peppermint and spearmint. Fats and Oils High-fat foods. This includes [place] fries and potato chips. Other Vinegar. Strong spices. This includes  black pepper, white pepper, red pepper, cayenne, curry powder, cloves, ginger, and chili powder. The items listed above may not be a complete list of foods and drinks to avoid. Contact your dietitian for more information. This information is not intended to replace advice given to you by your health care provider. Make sure you discuss any questions you have with your health care provider. Document Released: 12/18/2011 Document Revised: 11/24/2015 Document Reviewed: 04/22/2013 Elsevier Interactive Patient Education  2017 [employer].  Gastroesophageal Reflux Disease, Adult Normally, food travels down the esophagus and stays in the stomach to be digested. If a person has gastroesophageal reflux disease (GERD), food and stomach acid move back up into the esophagus. When this happens, the esophagus becomes sore and swollen (inflamed). Over time, GERD can make small holes (ulcers) in the lining of the esophagus. Follow these instructions at home: Diet  Follow a diet as told by your doctor. You may need to avoid foods and drinks such as: ? Coffee and tea (with or without caffeine). ? Drinks that contain alcohol. ? Energy drinks and sports drinks. ? Carbonated drinks or sodas. ? Chocolate and cocoa. ? Peppermint and mint flavorings. ? Garlic and onions. ? Horseradish. ? Spicy and acidic foods, such as peppers, chili powder, curry powder, vinegar, hot sauces, and BBQ sauce. ? Citrus fruit juices and citrus fruits, such as oranges, lemons, and limes. ? Tomato-based foods, such as red sauce, chili, salsa, and pizza with red sauce. ? Fried and fatty foods, such as donuts, french fries, potato chips, and high-fat dressings. ? High-fat meats, such as hot dogs, rib eye steak, sausage, ham, and bacon. ? High-fat dairy items, such as whole milk, butter, and cream cheese.  Eat small meals often. Avoid eating large meals.  Avoid drinking large amounts of liquid with your meals.  Avoid eating meals  during the 2-3 hours before bedtime.  Avoid lying down right after you eat.  Do not exercise right after you eat. General instructions  Pay attention to any changes in your symptoms.  Take over-the-counter and prescription medicines only as told by your doctor. Do not take aspirin, ibuprofen, or other NSAIDs unless your doctor says it is okay.  Do not use any tobacco products, including cigarettes, chewing tobacco, and e-cigarettes. If you need help quitting, ask your doctor.  Wear loose clothes. Do not wear anything tight around your waist.  Raise (elevate) the head of your bed about 6 inches (15 cm).  Try to lower your stress. If you need help doing this, ask your doctor.  If you are overweight, lose an amount of weight that is healthy for you. Ask your doctor about a safe weight loss goal.  Keep all follow-up visits as told by your doctor. This is important. Contact a doctor if:  You have new symptoms.  You lose weight and you do not know why it is happening.  You have trouble swallowing, or it hurts to swallow.  You have wheezing or a cough that keeps happening.  Your symptoms do not  get better with treatment.  You have a hoarse voice. Get help right away if:  You have pain in your arms, neck, jaw, teeth, or back.  You feel sweaty, dizzy, or light-headed.  You have chest pain or shortness of breath.  You throw up (vomit) and your throw up looks like blood or coffee grounds.  You pass out (faint).  Your poop (stool) is bloody or black.  You cannot swallow, drink, or eat. This information is not intended to replace advice given to you by your health care provider. Make sure you discuss any questions you have with your health care provider. Document Released: 12/05/2007 Document Revised: 11/24/2015 Document Reviewed: 10/13/2014 Elsevier Interactive Patient Education  [employer].

## 2018-05-08 ENCOUNTER — Telehealth: Payer: Self-pay | Admitting: Orthopedic Surgery

## 2018-05-08 NOTE — Telephone Encounter (Signed)
Patient requests refill: HYDROcodone-acetaminophen (NORCO/VICODIN) 5-325 MG tablet 56 tablet   [facility], [address], [place] 

## 2018-05-09 ENCOUNTER — Other Ambulatory Visit: Payer: Self-pay | Admitting: Orthopedic Surgery

## 2018-05-09 DIAGNOSIS — G894 Chronic pain syndrome: Secondary | ICD-10-CM

## 2018-05-09 MED ORDER — HYDROCODONE-ACETAMINOPHEN 5-325 MG PO TABS: 1.0000 | ORAL_TABLET | Freq: Four times a day (QID) | ORAL | 0 refills | Status: DC | PRN

## 2018-05-19 DIAGNOSIS — Z0000 Encounter for general adult medical examination without abnormal findings: Secondary | ICD-10-CM | POA: Diagnosis not present

## 2018-05-19 DIAGNOSIS — E118 Type 2 diabetes mellitus with unspecified complications: Secondary | ICD-10-CM | POA: Diagnosis not present

## 2018-05-19 DIAGNOSIS — I10 Essential (primary) hypertension: Secondary | ICD-10-CM | POA: Diagnosis not present

## 2018-05-19 DIAGNOSIS — M542 Cervicalgia: Secondary | ICD-10-CM | POA: Diagnosis not present

## 2018-05-22 ENCOUNTER — Other Ambulatory Visit: Payer: Self-pay | Admitting: Orthopedic Surgery

## 2018-05-22 ENCOUNTER — Telehealth: Payer: Self-pay | Admitting: Orthopedic Surgery

## 2018-05-22 DIAGNOSIS — G894 Chronic pain syndrome: Secondary | ICD-10-CM

## 2018-05-22 MED ORDER — HYDROCODONE-ACETAMINOPHEN 5-325 MG PO TABS: 1.0000 | ORAL_TABLET | Freq: Four times a day (QID) | ORAL | 0 refills | Status: DC | PRN

## 2018-05-22 NOTE — Telephone Encounter (Signed)
Hydrocodone-Acetaminophen  5/325 mg  Qty  56 Tablets  Take 1 tablet by mouth every 6 (six) hours as needed for moderate pain.  PATIENT USES [facility] ON [address]

## 2018-06-04 ENCOUNTER — Other Ambulatory Visit: Payer: Self-pay | Admitting: Orthopedic Surgery

## 2018-06-04 DIAGNOSIS — G894 Chronic pain syndrome: Secondary | ICD-10-CM

## 2018-06-04 MED ORDER — HYDROCODONE-ACETAMINOPHEN 5-325 MG PO TABS: 1.0000 | ORAL_TABLET | Freq: Four times a day (QID) | ORAL | 0 refills | Status: DC | PRN

## 2018-06-04 NOTE — Telephone Encounter (Signed)
Hydrocodone-Acetaminophen  5/325 mg  Qty  56 Tablets  Take 1 tablet by mouth every 6 (six) hours as needed for moderate pain.  PATIENT USES [facility] ON [address]

## 2018-06-11 DIAGNOSIS — E1140 Type 2 diabetes mellitus with diabetic neuropathy, unspecified: Secondary | ICD-10-CM | POA: Diagnosis not present

## 2018-06-11 DIAGNOSIS — M79674 Pain in right toe(s): Secondary | ICD-10-CM | POA: Diagnosis not present

## 2018-06-11 DIAGNOSIS — L110 Acquired keratosis follicularis: Secondary | ICD-10-CM | POA: Diagnosis not present

## 2018-06-11 DIAGNOSIS — M79672 Pain in left foot: Secondary | ICD-10-CM | POA: Diagnosis not present

## 2018-06-11 DIAGNOSIS — M79671 Pain in right foot: Secondary | ICD-10-CM | POA: Diagnosis not present

## 2018-06-17 ENCOUNTER — Other Ambulatory Visit: Payer: Self-pay | Admitting: Orthopedic Surgery

## 2018-06-17 DIAGNOSIS — G894 Chronic pain syndrome: Secondary | ICD-10-CM

## 2018-06-17 NOTE — Telephone Encounter (Signed)
Hydrocodone-Acetaminophen  5/325 mg  Qty  56 Tablets  Take 1 tablet by mouth every 6 (six) hours as needed for moderate pain.  PATIENT USES [facility] ON [address]

## 2018-06-18 MED ORDER — HYDROCODONE-ACETAMINOPHEN 5-325 MG PO TABS: 1.0000 | ORAL_TABLET | Freq: Four times a day (QID) | ORAL | 0 refills | Status: DC | PRN

## 2018-07-03 ENCOUNTER — Other Ambulatory Visit: Payer: Self-pay | Admitting: Orthopedic Surgery

## 2018-07-03 DIAGNOSIS — G894 Chronic pain syndrome: Secondary | ICD-10-CM

## 2018-07-03 NOTE — Telephone Encounter (Signed)
Patient requests refill on Hydrocodone/Acetaminophen 5-325 mgs.  Qty  56 ° °      °Sig: Take 1 tablet by mouth every 6 (six) hours as needed for moderate pain.  ° ° °Patient states he uses [facility] on [address] °

## 2018-07-04 MED ORDER — HYDROCODONE-ACETAMINOPHEN 5-325 MG PO TABS: 1.0000 | ORAL_TABLET | Freq: Four times a day (QID) | ORAL | 0 refills | Status: DC | PRN

## 2018-07-09 ENCOUNTER — Ambulatory Visit (INDEPENDENT_AMBULATORY_CARE_PROVIDER_SITE_OTHER): Payer: Medicare Other | Admitting: Orthopedic Surgery

## 2018-07-09 ENCOUNTER — Encounter: Payer: Self-pay | Admitting: Orthopedic Surgery

## 2018-07-09 ENCOUNTER — Ambulatory Visit (INDEPENDENT_AMBULATORY_CARE_PROVIDER_SITE_OTHER): Payer: Medicare Other

## 2018-07-09 VITALS — BP 111/78 | HR 83 | Ht 68.0 in | Wt 268.0 lb

## 2018-07-09 DIAGNOSIS — M7551 Bursitis of right shoulder: Secondary | ICD-10-CM

## 2018-07-09 DIAGNOSIS — M7552 Bursitis of left shoulder: Secondary | ICD-10-CM | POA: Diagnosis not present

## 2018-07-09 DIAGNOSIS — M170 Bilateral primary osteoarthritis of knee: Secondary | ICD-10-CM

## 2018-07-09 DIAGNOSIS — G5603 Carpal tunnel syndrome, bilateral upper limbs: Secondary | ICD-10-CM

## 2018-07-09 DIAGNOSIS — G894 Chronic pain syndrome: Secondary | ICD-10-CM | POA: Diagnosis not present

## 2018-07-09 DIAGNOSIS — M1710 Unilateral primary osteoarthritis, unspecified knee: Secondary | ICD-10-CM

## 2018-07-09 NOTE — Progress Notes (Signed)
Chief Complaint  Patient presents with  . Knee Pain    bilateral knees painful     Encounter Diagnoses  Name Primary?  . Primary osteoarthritis of both knees Yes  . Chronic pain syndrome   . Bilateral carpal tunnel syndrome   . Bilateral shoulder bursitis      53 yo male followed for multiple joint problems and chronic pain   He comes in today for routine follow-up scheduled to get x-rays of his knees to follow his primary arthritis  He is on hydrocodone for chronic pain he has a contract he is doing well with good pain control and no side effects  His carpal tunnel syndrome is improved with splinting  Currently his bursitis is not bothering him at all he has full range of motion there  He is eating better walking controlling his diet and he says some of his knee pain has improved      Review of Systems  Constitutional: Negative for fever.  Musculoskeletal: Negative for neck pain.  Skin: Negative.   Neurological: Positive for sensory change.       Carpal tunnel related with occasional radiation into the left forearm status post open treatment internal fixation forearm fracture remote   Past Medical History:  Diagnosis Date  . Acid reflux   . Arthritis   . Asthma   . Chronic back pain   . Diabetes mellitus without complication ([place])   . Eczema   . Hypertension   . Pneumonia     BP 111/78   Pulse 83   Ht 5\' 8"  (1.727 m)   Wt 268 lb (121.6 kg)   BMI 40.75 kg/m  Physical Exam Vitals signs reviewed.  Constitutional:      Appearance: He is well-developed.     Comments: Vital signs have been reviewed and are stable. Gen. appearance the patient is well-developed and well-nourished with normal grooming and hygiene.   Skin:    General: Skin is warm and dry.     Findings: No erythema.  Neurological:     Mental Status: He is alert and oriented to person, place, and time.     Gait: Gait normal.    We will start with his right knee his right knee is in varus he  has mild tenderness medial joint line his range of motion 115 degrees knee is stable strength is normal skin is intact pulses are good and normal sensation is noted  Left knee alignment again varus range of motion 115 degrees knee is stable strength is normal muscle tone excellent skin is normal pulses are good no edema sensation is normal.  Bilateral knee films today see separate cover  X-rays show varus alignment moderate both knees and severe arthritis especially medial compartment both knees with secondary bone changes of osteo-fight and sclerosis  Encounter Diagnoses  Name Primary?  . Primary osteoarthritis of both knees Yes  . Chronic pain syndrome   . Bilateral carpal tunnel syndrome   . Bilateral shoulder bursitis     Osteoarthritis of the knee continue weight loss and exercise no surgery needed until all nonoperative measures are exhausted and patient has pain inhibiting his activities of daily living and lifestyle  Chronic pain continue hydrocodone patient compliant  Bilateral carpal tunnel syndrome stable  Bilateral shoulder bursitis stable  Fu 6 months routine

## 2018-07-17 ENCOUNTER — Other Ambulatory Visit: Payer: Self-pay | Admitting: Orthopedic Surgery

## 2018-07-17 DIAGNOSIS — G894 Chronic pain syndrome: Secondary | ICD-10-CM

## 2018-07-17 MED ORDER — HYDROCODONE-ACETAMINOPHEN 5-325 MG PO TABS: 1.0000 | ORAL_TABLET | Freq: Four times a day (QID) | ORAL | 0 refills | Status: DC | PRN

## 2018-07-17 NOTE — Telephone Encounter (Signed)
Patient requests refill:  HYDROcodone-acetaminophen (NORCO/VICODIN) 5-325 MG tablet 56 tablet 0   - [facility], [address], [place]

## 2018-07-21 DIAGNOSIS — E118 Type 2 diabetes mellitus with unspecified complications: Secondary | ICD-10-CM | POA: Diagnosis not present

## 2018-07-21 DIAGNOSIS — I10 Essential (primary) hypertension: Secondary | ICD-10-CM | POA: Diagnosis not present

## 2018-07-21 DIAGNOSIS — K420 Umbilical hernia with obstruction, without gangrene: Secondary | ICD-10-CM | POA: Diagnosis not present

## 2018-07-21 DIAGNOSIS — G4737 Central sleep apnea in conditions classified elsewhere: Secondary | ICD-10-CM | POA: Diagnosis not present

## 2018-07-23 ENCOUNTER — Other Ambulatory Visit: Payer: Self-pay | Admitting: *Deleted

## 2018-07-23 ENCOUNTER — Other Ambulatory Visit: Payer: Self-pay

## 2018-07-23 ENCOUNTER — Encounter: Payer: Self-pay | Admitting: Gastroenterology

## 2018-07-23 ENCOUNTER — Ambulatory Visit (INDEPENDENT_AMBULATORY_CARE_PROVIDER_SITE_OTHER): Payer: Medicare Other | Admitting: Gastroenterology

## 2018-07-23 DIAGNOSIS — Z79899 Other long term (current) drug therapy: Secondary | ICD-10-CM

## 2018-07-23 DIAGNOSIS — Z1211 Encounter for screening for malignant neoplasm of colon: Secondary | ICD-10-CM

## 2018-07-23 MED ORDER — CLENPIQ 10-3.5-12 MG-GM -GM/160ML PO SOLN: 1.0000 | Freq: Once | ORAL | 0 refills | Status: DC
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
## 2018-07-23 NOTE — Patient Instructions (Signed)
Colonoscopy as scheduled.  Please see separate instructions.

## 2018-07-23 NOTE — Assessment & Plan Note (Addendum)
Very pleasant 53 year old gentleman presenting to schedule first ever colonoscopy.  Denies any bowel concerns.  No known family history of colon polyps or colon cancer.  He has chronic GERD for few years, well controlled with medication.  Mild normocytic anemia with hemoglobin of 12.6 a couple of months ago.  Plan for colonoscopy in the near future, deep sedation given chronic opioid use.  I have discussed the risks, alternatives, benefits with regards to but not limited to the risk of reaction to medication, bleeding, infection, perforation and the patient is agreeable to proceed. Written consent to be obtained.  Patient has sleep apnea, endo notified.

## 2018-07-24 ENCOUNTER — Telehealth: Payer: Self-pay

## 2018-07-24 NOTE — Telephone Encounter (Signed)
Called and informed pt of pre-op appt 07/31/18 at 1:15pm. Letter mailed.

## 2018-07-24 NOTE — Progress Notes (Signed)
CC'D TO PCP °

## 2018-07-29 NOTE — Patient Instructions (Signed)
   Your procedure is scheduled on: 08/07/2018  Report to [facility] at    7:15 AM.  Call this number if you have problems the morning of surgery: [phone]   Remember:              Follow Directions on the letter you received from Your Physician's office regarding the Bowel Prep  :  Take these medicines the morning of surgery with A SIP OF WATER: Lisinopril, Claritin and omerprazole   Do not wear jewelry, make-up or nail polish.    Do not bring valuables to the hospital.  Contacts, dentures or bridgework may not be worn into surgery.  .   Patients discharged the day of surgery will not be allowed to drive home.     Colonoscopy, Adult, Care After This sheet gives you information about how to care for yourself after your procedure. Your health care provider may also give you more specific instructions. If you have problems or questions, contact your health care provider. What can I expect after the procedure? After the procedure, it is common to have:  A small amount of blood in your stool for 24 hours after the procedure.  Some gas.  Mild abdominal cramping or bloating.  Follow these instructions at home: General instructions   For the first 24 hours after the procedure: ? Do not drive or use machinery. ? Do not sign important documents. ? Do not drink alcohol. ? Do your regular daily activities at a slower pace than normal. ? Eat soft, easy-to-digest foods. ? Rest often.  Take over-the-counter or prescription medicines only as told by your health care provider.  It is up to you to get the results of your procedure. Ask your health care provider, or the department performing the procedure, when your results will be ready. Relieving cramping and bloating  Try walking around when you have cramps or feel bloated.  Apply heat to your abdomen as told by your health care provider. Use a heat source that your health care provider recommends, such as a moist heat pack or a  heating pad. ? Place a towel between your skin and the heat source. ? Leave the heat on for 20-30 minutes. ? Remove the heat if your skin turns bright red. This is especially important if you are unable to feel pain, heat, or cold. You may have a greater risk of getting burned. Eating and drinking  Drink enough fluid to keep your urine clear or pale yellow.  Resume your normal diet as instructed by your health care provider. Avoid heavy or fried foods that are hard to digest.  Avoid drinking alcohol for as long as instructed by your health care provider. Contact a health care provider if:  You have blood in your stool 2-3 days after the procedure. Get help right away if:  You have more than a small spotting of blood in your stool.  You pass large blood clots in your stool.  Your abdomen is swollen.  You have nausea or vomiting.  You have a fever.  You have increasing abdominal pain that is not relieved with medicine. This information is not intended to replace advice given to you by your health care provider. Make sure you discuss any questions you have with your health care provider. Document Released: 01/31/2004 Document Revised: 03/12/2016 Document Reviewed: 08/30/2015 Elsevier Interactive Patient Education  [employer].

## 2018-07-30 ENCOUNTER — Other Ambulatory Visit: Payer: Self-pay | Admitting: Orthopedic Surgery

## 2018-07-30 DIAGNOSIS — G894 Chronic pain syndrome: Secondary | ICD-10-CM

## 2018-07-30 MED ORDER — HYDROCODONE-ACETAMINOPHEN 5-325 MG PO TABS: 1.0000 | ORAL_TABLET | Freq: Four times a day (QID) | ORAL | 0 refills | Status: DC | PRN

## 2018-07-30 NOTE — Telephone Encounter (Signed)
Hydrocodone-Acetaminophen 5/325 mg  Qty 56 Tablets  PATIENT USES [facility] ON [address]

## 2018-07-31 ENCOUNTER — Encounter (HOSPITAL_COMMUNITY)
Admission: RE | Admit: 2018-07-31 | Discharge: 2018-07-31 | Disposition: A | Discharge status: Home / Self Care | Payer: Medicare Other | Source: Ambulatory Visit | Attending: Internal Medicine | Admitting: Internal Medicine

## 2018-07-31 ENCOUNTER — Other Ambulatory Visit: Payer: Self-pay

## 2018-07-31 ENCOUNTER — Encounter (HOSPITAL_COMMUNITY): Payer: Self-pay

## 2018-07-31 DIAGNOSIS — Z01818 Encounter for other preprocedural examination: Secondary | ICD-10-CM | POA: Diagnosis not present

## 2018-07-31 LAB — CBC
HCT: 40.9 % (ref 39.0–52.0)
Hemoglobin: 12.7 g/dL — ABNORMAL LOW (ref 13.0–17.0)
MCH: 26.8 pg (ref 26.0–34.0)
MCHC: 31.1 g/dL (ref 30.0–36.0)
MCV: 86.5 fL (ref 80.0–100.0)
Platelets: 261 10*3/uL (ref 150–400)
RBC: 4.73 MIL/uL (ref 4.22–5.81)
RDW: 14.1 % (ref 11.5–15.5)
WBC: 5.2 10*3/uL (ref 4.0–10.5)
nRBC: 0 % (ref 0.0–0.2)

## 2018-07-31 LAB — BASIC METABOLIC PANEL
Anion gap: 9 (ref 5–15)
BUN: 21 mg/dL — AB (ref 6–20)
CO2: 23 mmol/L (ref 22–32)
Calcium: 9.3 mg/dL (ref 8.9–10.3)
Chloride: 102 mmol/L (ref 98–111)
Creatinine, Ser: 1.02 mg/dL (ref 0.61–1.24)
GFR calc Af Amer: >60 mL/min (ref >60)
Glucose, Bld: 106 mg/dL — ABNORMAL HIGH (ref 70–99)
POTASSIUM: 4 mmol/L (ref 3.5–5.1)
SODIUM: 134 mmol/L — AB (ref 135–145)

## 2018-08-07 ENCOUNTER — Encounter (HOSPITAL_COMMUNITY): Payer: Self-pay | Admitting: Emergency Medicine

## 2018-08-07 ENCOUNTER — Other Ambulatory Visit: Payer: Self-pay

## 2018-08-07 ENCOUNTER — Encounter (HOSPITAL_COMMUNITY)
Admission: RE | Disposition: A | Discharge status: Home / Self Care | Payer: Self-pay | Source: Home / Self Care | Attending: Internal Medicine

## 2018-08-07 ENCOUNTER — Ambulatory Visit (HOSPITAL_COMMUNITY): Payer: Medicare Other | Admitting: Anesthesiology

## 2018-08-07 ENCOUNTER — Ambulatory Visit (HOSPITAL_COMMUNITY)
Admission: RE | Admit: 2018-08-07 | Discharge: 2018-08-07 | Disposition: A | Discharge status: Home / Self Care | Payer: Medicare Other | Attending: Internal Medicine | Admitting: Internal Medicine

## 2018-08-07 DIAGNOSIS — J449 Chronic obstructive pulmonary disease, unspecified: Secondary | ICD-10-CM | POA: Diagnosis not present

## 2018-08-07 DIAGNOSIS — K219 Gastro-esophageal reflux disease without esophagitis: Secondary | ICD-10-CM | POA: Diagnosis not present

## 2018-08-07 DIAGNOSIS — D127 Benign neoplasm of rectosigmoid junction: Secondary | ICD-10-CM | POA: Diagnosis not present

## 2018-08-07 DIAGNOSIS — G8929 Other chronic pain: Secondary | ICD-10-CM | POA: Diagnosis not present

## 2018-08-07 DIAGNOSIS — M549 Dorsalgia, unspecified: Secondary | ICD-10-CM | POA: Insufficient documentation

## 2018-08-07 DIAGNOSIS — Z8249 Family history of ischemic heart disease and other diseases of the circulatory system: Secondary | ICD-10-CM | POA: Diagnosis not present

## 2018-08-07 DIAGNOSIS — M1990 Unspecified osteoarthritis, unspecified site: Secondary | ICD-10-CM | POA: Insufficient documentation

## 2018-08-07 DIAGNOSIS — Z1211 Encounter for screening for malignant neoplasm of colon: Secondary | ICD-10-CM | POA: Diagnosis not present

## 2018-08-07 DIAGNOSIS — I10 Essential (primary) hypertension: Secondary | ICD-10-CM | POA: Insufficient documentation

## 2018-08-07 DIAGNOSIS — E119 Type 2 diabetes mellitus without complications: Secondary | ICD-10-CM | POA: Insufficient documentation

## 2018-08-07 DIAGNOSIS — Z79899 Other long term (current) drug therapy: Secondary | ICD-10-CM | POA: Diagnosis not present

## 2018-08-07 DIAGNOSIS — Z7982 Long term (current) use of aspirin: Secondary | ICD-10-CM | POA: Diagnosis not present

## 2018-08-07 DIAGNOSIS — G4733 Obstructive sleep apnea (adult) (pediatric): Secondary | ICD-10-CM | POA: Diagnosis not present

## 2018-08-07 DIAGNOSIS — K5730 Diverticulosis of large intestine without perforation or abscess without bleeding: Secondary | ICD-10-CM | POA: Diagnosis not present

## 2018-08-07 DIAGNOSIS — J45909 Unspecified asthma, uncomplicated: Secondary | ICD-10-CM | POA: Diagnosis not present

## 2018-08-07 DIAGNOSIS — Z87891 Personal history of nicotine dependence: Secondary | ICD-10-CM | POA: Diagnosis not present

## 2018-08-07 DIAGNOSIS — D125 Benign neoplasm of sigmoid colon: Secondary | ICD-10-CM | POA: Diagnosis not present

## 2018-08-07 DIAGNOSIS — D214 Benign neoplasm of connective and other soft tissue of abdomen: Secondary | ICD-10-CM | POA: Diagnosis not present

## 2018-08-07 HISTORY — PX: POLYPECTOMY: SHX5525

## 2018-08-07 HISTORY — PX: COLONOSCOPY WITH PROPOFOL: SHX5780

## 2018-08-07 LAB — GLUCOSE, CAPILLARY
Glucose-Capillary: 102 mg/dL — ABNORMAL HIGH (ref 70–99)
Glucose-Capillary: 93 mg/dL (ref 70–99)

## 2018-08-07 SURGERY — COLONOSCOPY WITH PROPOFOL
Anesthesia: Monitor Anesthesia Care

## 2018-08-07 MED ORDER — KETAMINE HCL 50 MG/5ML IJ SOSY
PREFILLED_SYRINGE | INTRAMUSCULAR | Status: AC
  Filled 2018-08-07: qty 5

## 2018-08-07 MED ORDER — KETAMINE HCL 10 MG/ML IJ SOLN
INTRAMUSCULAR | Status: DC | PRN
  Administered 2018-08-07: 5 mg via INTRAVENOUS
  Administered 2018-08-07: 10 mg via INTRAVENOUS

## 2018-08-07 MED ORDER — LACTATED RINGERS IV SOLN
INTRAVENOUS | Status: DC
  Administered 2018-08-07: 1000 mL via INTRAVENOUS

## 2018-08-07 MED ORDER — GLYCOPYRROLATE 0.2 MG/ML IJ SOLN
INTRAMUSCULAR | Status: DC | PRN
  Administered 2018-08-07: 0.2 mg via INTRAVENOUS

## 2018-08-07 MED ORDER — PROPOFOL 10 MG/ML IV BOLUS
INTRAVENOUS | Status: DC | PRN
  Administered 2018-08-07: 20 mg via INTRAVENOUS
  Administered 2018-08-07: 30 mg via INTRAVENOUS

## 2018-08-07 MED ORDER — PROPOFOL 500 MG/50ML IV EMUL
INTRAVENOUS | Status: DC | PRN
  Administered 2018-08-07: 09:00:00 via INTRAVENOUS
  Administered 2018-08-07: 150 ug/kg/min via INTRAVENOUS

## 2018-08-07 MED ORDER — LIDOCAINE HCL (CARDIAC) PF 100 MG/5ML IV SOSY
PREFILLED_SYRINGE | INTRAVENOUS | Status: DC | PRN
  Administered 2018-08-07: 40 mg via INTRAVENOUS

## 2018-08-07 MED ORDER — CHLORHEXIDINE GLUCONATE CLOTH 2 % EX PADS: 6.0000 | MEDICATED_PAD | Freq: Once | CUTANEOUS | Status: DC

## 2018-08-07 NOTE — Anesthesia Preprocedure Evaluation (Signed)
Anesthesia Evaluation  Patient identified by MRN, date of birth, ID band Patient awake    Reviewed: Allergy & Precautions, H&P , NPO status , Patient's Chart, lab work & pertinent test results  Airway Mallampati: II  TM Distance: >3 FB Neck ROM: full    Dental no notable dental hx.    Pulmonary asthma , sleep apnea , pneumonia, COPD, former smoker,  Quit using CPAP   Pulmonary exam normal breath sounds clear to auscultation       Cardiovascular Exercise Tolerance: Good hypertension, negative cardio ROS   Rhythm:regular Rate:Normal     Neuro/Psych negative neurological ROS  negative psych ROS   GI/Hepatic Neg liver ROS, GERD  ,  Endo/Other  negative endocrine ROSdiabetes  Renal/GU negative Renal ROS  negative genitourinary   Musculoskeletal   Abdominal   Peds  Hematology negative hematology ROS (+)   Anesthesia Other Findings   Reproductive/Obstetrics negative OB ROS                             Anesthesia Physical Anesthesia Plan  ASA: III  Anesthesia Plan: MAC   Post-op Pain Management:    Induction:   PONV Risk Score and Plan:   Airway Management Planned:   Additional Equipment:   Intra-op Plan:   Post-operative Plan:   Informed Consent: I have reviewed the patients History and Physical, chart, labs and discussed the procedure including the risks, benefits and alternatives for the proposed anesthesia with the patient or authorized representative who has indicated his/her understanding and acceptance.     Dental Advisory Given  Plan Discussed with: CRNA  Anesthesia Plan Comments:         Anesthesia Quick Evaluation

## 2018-08-07 NOTE — Interval H&P Note (Signed)
History and Physical Interval Note:  08/07/2018 7:31 AM  Terry Case  has presented today for surgery, with the diagnosis of screening colonoscopy  The various methods of treatment have been discussed with the patient and family. After consideration of risks, benefits and other options for treatment, the patient has consented to  Procedure(s) with comments: COLONOSCOPY WITH PROPOFOL (N/A) - 9:00am as a surgical intervention .  The patient's history has been reviewed, patient examined, no change in status, stable for surgery.  I have reviewed the patient's chart and labs.  Questions were answered to the patient's satisfaction.     Manus Rudd

## 2018-08-07 NOTE — Transfer of Care (Signed)
Immediate Anesthesia Transfer of Care Note  Patient: Terry Case  Procedure(s) Performed: COLONOSCOPY WITH PROPOFOL (N/A ) POLYPECTOMY  Patient Location: PACU  Anesthesia Type:MAC  Level of Consciousness: awake, alert , oriented and patient cooperative  Airway & Oxygen Therapy: Patient Spontanous Breathing  Post-op Assessment: Report given to RN and Post -op Vital signs reviewed and stable  Post vital signs: Reviewed and stable  Last Vitals:  Vitals Value Taken Time  BP 100/79 08/07/2018  9:00 AM  Temp    Pulse 90 08/07/2018  9:01 AM  Resp 17 08/07/2018  9:01 AM  SpO2 97 % 08/07/2018  9:01 AM  Vitals shown include unvalidated device data.  Last Pain:  Vitals:   08/07/18 0850  TempSrc:   PainSc: 0-No pain         Complications: No apparent anesthesia complications

## 2018-08-07 NOTE — Discharge Instructions (Signed)
Colonoscopy Discharge Instructions  Read the instructions outlined below and refer to this sheet in the next few weeks. These discharge instructions provide you with general information on caring for yourself after you leave the hospital. Your doctor may also give you specific instructions. While your treatment has been planned according to the most current medical practices available, unavoidable complications occasionally occur. If you have any problems or questions after discharge, call Dr. Gala Romney at [phone]. ACTIVITY  You may resume your regular activity, but move at a slower pace for the next 24 hours.   Take frequent rest periods for the next 24 hours.   Walking will help get rid of the air and reduce the bloated feeling in your belly (abdomen).   No driving for 24 hours (because of the medicine (anesthesia) used during the test).    Do not sign any important legal documents or operate any machinery for 24 hours (because of the anesthesia used during the test).  NUTRITION  Drink plenty of fluids.   You may resume your normal diet as instructed by your doctor.   Begin with a light meal and progress to your normal diet. Heavy or fried foods are harder to digest and may make you feel sick to your stomach (nauseated).   Avoid alcoholic beverages for 24 hours or as instructed.  MEDICATIONS  You may resume your normal medications unless your doctor tells you otherwise.  WHAT YOU CAN EXPECT TODAY  Some feelings of bloating in the abdomen.   Passage of more gas than usual.   Spotting of blood in your stool or on the toilet paper.  IF YOU HAD POLYPS REMOVED DURING THE COLONOSCOPY:  No aspirin products for 7 days or as instructed.   No alcohol for 7 days or as instructed.   Eat a soft diet for the next 24 hours.  FINDING OUT THE RESULTS OF YOUR TEST Not all test results are available during your visit. If your test results are not back during the visit, make an appointment  with your caregiver to find out the results. Do not assume everything is normal if you have not heard from your caregiver or the medical facility. It is important for you to follow up on all of your test results.  SEEK IMMEDIATE MEDICAL ATTENTION IF:  You have more than a spotting of blood in your stool.   Your belly is swollen (abdominal distention).   You are nauseated or vomiting.   You have a temperature over 101.   You have abdominal pain or discomfort that is severe or gets worse throughout the day.    Diverticulosis and colon polyp information provided  Further recommendations to follow pending review of pathology report    Colon Polyps  Polyps are tissue growths inside the body. Polyps can grow in many places, including the large intestine (colon). A polyp may be a round bump or a mushroom-shaped growth. You could have one polyp or several. Most colon polyps are noncancerous (benign). However, some colon polyps can become cancerous over time. Finding and removing the polyps early can help prevent this. What are the causes? The exact cause of colon polyps is not known. What increases the risk? You are more likely to develop this condition if you:  Have a family history of colon cancer or colon polyps.  Are older than 35 or older than 45 if you are African American.  Have inflammatory bowel disease, such as ulcerative colitis or Crohn's disease.  Have certain  hereditary conditions, such as: ? Familial adenomatous polyposis. ? Lynch syndrome. ? Turcot syndrome. ? Peutz-Jeghers syndrome.  Are overweight.  Smoke cigarettes.  Do not get enough exercise.  Drink too much alcohol.  Eat a diet that is high in fat and red meat and low in fiber.  Had childhood cancer that was treated with abdominal radiation. What are the signs or symptoms? Most polyps do not cause symptoms. If you have symptoms, they may include:  Blood coming from your rectum when having a bowel  movement.  Blood in your stool. The stool may look dark red or black.  Abdominal pain.  A change in bowel habits, such as constipation or diarrhea. How is this diagnosed? This condition is diagnosed with a colonoscopy. This is a procedure in which a lighted, flexible scope is inserted into the anus and then passed into the colon to examine the area. Polyps are sometimes found when a colonoscopy is done as part of routine cancer screening tests. How is this treated? Treatment for this condition involves removing any polyps that are found. Most polyps can be removed during a colonoscopy. Those polyps will then be tested for cancer. Additional treatment may be needed depending on the results of testing. Follow these instructions at home: Lifestyle  Maintain a healthy weight, or lose weight if recommended by your health care provider.  Exercise every day or as told by your health care provider.  Do not use any products that contain nicotine or tobacco, such as cigarettes and e-cigarettes. If you need help quitting, ask your health care provider.  If you drink alcohol, limit how much you have: ? 0-1 drink a day for women. ? 0-2 drinks a day for men.  Be aware of how much alcohol is in your drink. In the [place] one drink equals one 12 oz bottle of beer (355 mL), one 5 oz glass of wine (148 mL), or one 1 oz shot of hard liquor (44 mL). Eating and drinking   Eat foods that are high in fiber, such as fruits, vegetables, and whole grains.  Eat foods that are high in calcium and vitamin D, such as milk, cheese, yogurt, eggs, liver, fish, and broccoli.  Limit foods that are high in fat, such as fried foods and desserts.  Limit the amount of red meat and processed meat you eat, such as hot dogs, sausage, bacon, and lunch meats. General instructions  Keep all follow-up visits as told by your health care provider. This is important. ? This includes having regularly scheduled  colonoscopies. ? Talk to your health care provider about when you need a colonoscopy. Contact a health care provider if:  You have new or worsening bleeding during a bowel movement.  You have new or increased blood in your stool.  You have a change in bowel habits.  You lose weight for no known reason. Summary  Polyps are tissue growths inside the body. Polyps can grow in many places, including the colon.  Most colon polyps are noncancerous (benign), but some can become cancerous over time.  This condition is diagnosed with a colonoscopy.  Treatment for this condition involves removing any polyps that are found. Most polyps can be removed during a colonoscopy. This information is not intended to replace advice given to you by your health care provider. Make sure you discuss any questions you have with your health care provider. Document Released: 03/14/2004 Document Revised: 10/03/2017 Document Reviewed: 10/03/2017 Elsevier Interactive Patient Education  2019 [employer].  Diverticulosis ° °Diverticulosis is a condition that develops when small pouches (diverticula) form in the wall of the large intestine (colon). The colon is where water is absorbed and stool is formed. The pouches form when the inside layer of the colon pushes through weak spots in the outer layers of the colon. You may have a few pouches or many of them. °What are the causes? °The cause of this condition is not known. °What increases the risk? °The following factors may make you more likely to develop this condition: °· Being older than age 60. Your risk for this condition increases with age. Diverticulosis is rare among people younger than age 30. By age 80, many people have it. °· Eating a low-fiber diet. °· Having frequent constipation. °· Being overweight. °· Not getting enough exercise. °· Smoking. °· Taking over-the-counter pain medicines, like aspirin and ibuprofen. °· Having a family history of  diverticulosis. °What are the signs or symptoms? °In most people, there are no symptoms of this condition. If you do have symptoms, they may include: °· Bloating. °· Cramps in the abdomen. °· Constipation or diarrhea. °· Pain in the lower left side of the abdomen. °How is this diagnosed? °This condition is most often diagnosed during an exam for other colon problems. Because diverticulosis usually has no symptoms, it often cannot be diagnosed independently. This condition may be diagnosed by: °· Using a flexible scope to examine the colon (colonoscopy). °· Taking an X-ray of the colon after dye has been put into the colon (barium enema). °· Doing a CT scan. °How is this treated? °You may not need treatment for this condition if you have never developed an infection related to diverticulosis. If you have had an infection before, treatment may include: °· Eating a high-fiber diet. This may include eating more fruits, vegetables, and grains. °· Taking a fiber supplement. °· Taking a live bacteria supplement (probiotic). °· Taking medicine to relax your colon. °· Taking antibiotic medicines. °Follow these instructions at home: °· Drink 6-8 glasses of water or more each day to prevent constipation. °· Try not to strain when you have a bowel movement. °· If you have had an infection before: °? Eat more fiber as directed by your health care provider or your diet and nutrition specialist (dietitian). °? Take a fiber supplement or probiotic, if your health care provider approves. °· Take over-the-counter and prescription medicines only as told by your health care provider. °· If you were prescribed an antibiotic, take it as told by your health care provider. Do not stop taking the antibiotic even if you start to feel better. °· Keep all follow-up visits as told by your health care provider. This is important. °Contact a health care provider if: °· You have pain in your abdomen. °· You have bloating. °· You have  cramps. °· You have not had a bowel movement in 3 days. °Get help right away if: °· Your pain gets worse. °· Your bloating becomes very bad. °· You have a fever or chills, and your symptoms suddenly get worse. °· You vomit. °· You have bowel movements that are bloody or black. °· You have bleeding from your rectum. °Summary °· Diverticulosis is a condition that develops when small pouches (diverticula) form in the wall of the large intestine (colon). °· You may have a few pouches or many of them. °· This condition is most often diagnosed during an exam for other colon problems. °· If you have had an infection related   to diverticulosis, treatment may include increasing the fiber in your diet, taking supplements, or taking medicines. This information is not intended to replace advice given to you by your health care provider. Make sure you discuss any questions you have with your health care provider. Document Released: 03/15/2004 Document Revised: 05/07/2016 Document Reviewed: 05/07/2016 Elsevier Interactive Patient Education  2019 [employer], Care After These instructions provide you with information about caring for yourself after your procedure. Your health care provider may also give you more specific instructions. Your treatment has been planned according to current medical practices, but problems sometimes occur. Call your health care provider if you have any problems or questions after your procedure. What can I expect after the procedure? After your procedure, you may:  Feel sleepy for several hours.  Feel clumsy and have poor balance for several hours.  Feel forgetful about what happened after the procedure.  Have poor judgment for several hours.  Feel nauseous or vomit.  Have a sore throat if you had a breathing tube during the procedure. Follow these instructions at home: For at least 24 hours after the procedure:      Have a responsible adult  stay with you. It is important to have someone help care for you until you are awake and alert.  Rest as needed.  Do not: ? Participate in activities in which you could fall or become injured. ? Drive. ? Use heavy machinery. ? Drink alcohol. ? Take sleeping pills or medicines that cause drowsiness. ? Make important decisions or sign legal documents. ? Take care of children on your own. Eating and drinking  Follow the diet that is recommended by your health care provider.  If you vomit, drink water, juice, or soup when you can drink without vomiting.  Make sure you have little or no nausea before eating solid foods. General instructions  Take over-the-counter and prescription medicines only as told by your health care provider.  If you have sleep apnea, surgery and certain medicines can increase your risk for breathing problems. Follow instructions from your health care provider about wearing your sleep device: ? Anytime you are sleeping, including during daytime naps. ? While taking prescription pain medicines, sleeping medicines, or medicines that make you drowsy.  If you smoke, do not smoke without supervision.  Keep all follow-up visits as told by your health care provider. This is important. Contact a health care provider if:  You keep feeling nauseous or you keep vomiting.  You feel light-headed.  You develop a rash.  You have a fever. Get help right away if:  You have trouble breathing. Summary  For several hours after your procedure, you may feel sleepy and have poor judgment.  Have a responsible adult stay with you for at least 24 hours or until you are awake and alert. This information is not intended to replace advice given to you by your health care provider. Make sure you discuss any questions you have with your health care provider. Document Released: 10/09/2015 Document Revised: 02/01/2017 Document Reviewed: 10/09/2015 Elsevier Interactive Patient  Education  2019 [employer].

## 2018-08-07 NOTE — Anesthesia Postprocedure Evaluation (Signed)
Anesthesia Post Note  Patient: Terry Case  Procedure(s) Performed: COLONOSCOPY WITH PROPOFOL (N/A ) POLYPECTOMY  Patient location during evaluation: PACU Anesthesia Type: MAC Level of consciousness: awake and alert and oriented Pain management: pain level controlled Vital Signs Assessment: post-procedure vital signs reviewed and stable Respiratory status: spontaneous breathing Cardiovascular status: stable Postop Assessment: no apparent nausea or vomiting Anesthetic complications: no     Last Vitals:  Vitals:   08/07/18 0733  BP: 138/84  Pulse: 72  Resp: 19  Temp: 36.7 C  SpO2: 96%    Last Pain:  Vitals:   08/07/18 0850  TempSrc:   PainSc: 0-No pain                 ADAMS, AMY A

## 2018-08-07 NOTE — Anesthesia Procedure Notes (Signed)
Procedure Name: MAC Date/Time: 08/07/2018 8:32 AM Performed by: Andree Elk Amy A, CRNA Pre-anesthesia Checklist: Patient identified, Emergency Drugs available, Suction available, Timeout performed and Patient being monitored Patient Re-evaluated:Patient Re-evaluated prior to induction Oxygen Delivery Method: Non-rebreather mask

## 2018-08-07 NOTE — Op Note (Signed)
[facility] Patient Name: Terry Case Procedure Date: 08/07/2018 8:21 AM MRN: [id number] Date of Birth: 22-Jan-1966 Attending MD: Norvel Richards , MD CSN: [id number] Age: 53 Admit Type: Outpatient Procedure:                Colonoscopy Indications:              Screening for colorectal malignant neoplasm Providers:                Norvel Richards, MD, Janeece Riggers, RN, Aram Candela Referring MD:             Delphina Cahill, MD Medicines:                Propofol per Anesthesia Complications:            No immediate complications. Estimated Blood Loss:     Estimated blood loss was minimal. Procedure:                Pre-Anesthesia Assessment:                           - Prior to the procedure, a History and Physical                            was performed, and patient medications and                            allergies were reviewed. The patient's tolerance of                            previous anesthesia was also reviewed. The risks                            and benefits of the procedure and the sedation                            options and risks were discussed with the patient.                            All questions were answered, and informed consent                            was obtained. Prior Anticoagulants: The patient has                            taken no previous anticoagulant or antiplatelet                            agents. ASA Grade Assessment: II - A patient with                            mild systemic disease. After reviewing the risks  and benefits, the patient was deemed in                            satisfactory condition to undergo the procedure.                           After obtaining informed consent, the colonoscope                            was passed under direct vision. Throughout the                            procedure, the patient's blood pressure, pulse, and     oxygen saturations were monitored continuously. The                            CF-[id number] ([id number]) scope was introduced through                            the and advanced to the the cecum, identified by                            appendiceal orifice and ileocecal valve. The                            colonoscopy was performed without difficulty. The                            patient tolerated the procedure well. The quality                            of the bowel preparation was adequate. The                            ileocecal valve, appendiceal orifice, and rectum                            were photographed. The entire colon was well                            visualized. Scope In: 8:37:48 AM Scope Out: 8:53:25 AM Scope Withdrawal Time: 0 hours 11 minutes 38 seconds  Total Procedure Duration: 0 hours 15 minutes 37 seconds  Findings:      The perianal and digital rectal examinations were normal.      A 5 mm polyp was found in the recto-sigmoid colon. The polyp was       semi-pedunculated. The polyp was removed with a cold snare. Resection       and retrieval were complete. Estimated blood loss was minimal.      A few small-mouthed diverticula were found in the entire colon.      The exam was otherwise without abnormality on direct and retroflexion       views. Impression:               - One 5 mm polyp at the recto-sigmoid colon,  removed with a cold snare. Resected and retrieved.                           - Diverticulosis in the entire examined colon.                           - The examination was otherwise normal on direct                            and retroflexion views. Moderate Sedation:      Moderate (conscious) sedation was personally administered by an       anesthesia professional. The following parameters were monitored: oxygen       saturation, heart rate, blood pressure, respiratory rate, EKG, adequacy       of pulmonary ventilation, and  response to care. Recommendation:           - Written discharge instructions were provided to                            the patient.                           - The signs and symptoms of potential delayed                            complications were discussed with the patient.                           - Patient has a contact number available for                            emergencies.                           - Return to normal activities tomorrow.                           - Resume previous diet.                           - Repeat colonoscopy date to be determined after                            pending pathology results are reviewed for                            surveillance based on pathology results.                           - Return to GI clinic (date not yet determined). Procedure Code(s):        --- Professional ---                           [phone], Colonoscopy, flexible; with removal of  tumor(s), polyp(s), or other lesion(s) by snare                            technique Diagnosis Code(s):        --- Professional ---                           Z12.11, Encounter for screening for malignant                            neoplasm of colon                           D12.7, Benign neoplasm of rectosigmoid junction                           K57.30, Diverticulosis of large intestine without                            perforation or abscess without bleeding CPT copyright 2018 American Medical Association. All rights reserved. The codes documented in this report are preliminary and upon coder review may  be revised to meet current compliance requirements. Cristopher Estimable. Kaipo Ardis, MD Norvel Richards, MD 08/07/2018 8:59:20 AM This report has been signed electronically. Number of Addenda: 0

## 2018-08-11 ENCOUNTER — Encounter: Payer: Self-pay | Admitting: Internal Medicine

## 2018-08-13 ENCOUNTER — Encounter (HOSPITAL_COMMUNITY): Payer: Self-pay | Admitting: Internal Medicine

## 2018-08-13 ENCOUNTER — Other Ambulatory Visit: Payer: Self-pay | Admitting: Orthopedic Surgery

## 2018-08-13 DIAGNOSIS — G894 Chronic pain syndrome: Secondary | ICD-10-CM

## 2018-08-13 MED ORDER — HYDROCODONE-ACETAMINOPHEN 5-325 MG PO TABS: 1.0000 | ORAL_TABLET | Freq: Four times a day (QID) | ORAL | 0 refills | Status: DC | PRN

## 2018-08-13 NOTE — Telephone Encounter (Signed)
Patient called for refill: HYDROcodone-acetaminophen (NORCO/VICODIN) 5-325 MG tablet 56 tablet 0   - [facility], [place]

## 2018-08-26 ENCOUNTER — Other Ambulatory Visit: Payer: Self-pay | Admitting: Orthopedic Surgery

## 2018-08-26 DIAGNOSIS — G894 Chronic pain syndrome: Secondary | ICD-10-CM

## 2018-08-26 NOTE — Telephone Encounter (Signed)
Patient requests refill: acetaminophen (NORCO/VICODIN) 5-325 MG tablet 56 tablet  -[facility], [address] [place]

## 2018-08-27 MED ORDER — HYDROCODONE-ACETAMINOPHEN 5-325 MG PO TABS: 1.0000 | ORAL_TABLET | Freq: Four times a day (QID) | ORAL | 0 refills | Status: DC | PRN

## 2018-09-03 DIAGNOSIS — E1140 Type 2 diabetes mellitus with diabetic neuropathy, unspecified: Secondary | ICD-10-CM | POA: Diagnosis not present

## 2018-09-03 DIAGNOSIS — M79674 Pain in right toe(s): Secondary | ICD-10-CM | POA: Diagnosis not present

## 2018-09-03 DIAGNOSIS — M79672 Pain in left foot: Secondary | ICD-10-CM | POA: Diagnosis not present

## 2018-09-03 DIAGNOSIS — L110 Acquired keratosis follicularis: Secondary | ICD-10-CM | POA: Diagnosis not present

## 2018-09-03 DIAGNOSIS — M79671 Pain in right foot: Secondary | ICD-10-CM | POA: Diagnosis not present

## 2018-09-08 ENCOUNTER — Other Ambulatory Visit: Payer: Self-pay | Admitting: Orthopedic Surgery

## 2018-09-08 DIAGNOSIS — G894 Chronic pain syndrome: Secondary | ICD-10-CM

## 2018-09-08 MED ORDER — HYDROCODONE-ACETAMINOPHEN 5-325 MG PO TABS: 1.0000 | ORAL_TABLET | Freq: Four times a day (QID) | ORAL | 0 refills | Status: DC | PRN

## 2018-09-08 NOTE — Telephone Encounter (Signed)
Patient requests refill on Hydrocodone/Acetaminophen 5-325  Mgs.   Qty  56  Sig: Take 1 tablet by mouth every 6 (six) hours as needed for moderate pain.       Patient states he uses [facility] on [address] 

## 2018-09-23 ENCOUNTER — Other Ambulatory Visit: Payer: Self-pay | Admitting: Orthopedic Surgery

## 2018-09-23 DIAGNOSIS — G894 Chronic pain syndrome: Secondary | ICD-10-CM

## 2018-09-23 NOTE — Telephone Encounter (Signed)
Patient requests refill: HYDROcodone-acetaminophen (NORCO/VICODIN) 5-325 MG tablet 56 tablet   [facility], [address],  

## 2018-09-24 MED ORDER — HYDROCODONE-ACETAMINOPHEN 5-325 MG PO TABS: 1.0000 | ORAL_TABLET | Freq: Four times a day (QID) | ORAL | 0 refills | Status: DC | PRN

## 2018-10-06 ENCOUNTER — Other Ambulatory Visit: Payer: Self-pay | Admitting: Orthopedic Surgery

## 2018-10-06 DIAGNOSIS — G894 Chronic pain syndrome: Secondary | ICD-10-CM

## 2018-10-06 MED ORDER — HYDROCODONE-ACETAMINOPHEN 5-325 MG PO TABS: 1.0000 | ORAL_TABLET | Freq: Four times a day (QID) | ORAL | 0 refills | Status: DC | PRN

## 2018-10-06 NOTE — Telephone Encounter (Signed)
Patient requests refill: HYDROcodone-acetaminophen (NORCO/VICODIN) 5-325 MG tablet 56 tablet   [facility], [address], [place] 

## 2018-10-20 ENCOUNTER — Other Ambulatory Visit: Payer: Self-pay | Admitting: Orthopedic Surgery

## 2018-10-20 DIAGNOSIS — I10 Essential (primary) hypertension: Secondary | ICD-10-CM | POA: Diagnosis not present

## 2018-10-20 DIAGNOSIS — E1169 Type 2 diabetes mellitus with other specified complication: Secondary | ICD-10-CM | POA: Diagnosis not present

## 2018-10-20 DIAGNOSIS — M791 Myalgia, unspecified site: Secondary | ICD-10-CM | POA: Diagnosis not present

## 2018-10-20 DIAGNOSIS — M7918 Myalgia, other site: Secondary | ICD-10-CM | POA: Diagnosis not present

## 2018-10-20 DIAGNOSIS — G894 Chronic pain syndrome: Secondary | ICD-10-CM

## 2018-10-20 NOTE — Telephone Encounter (Signed)
Patient requests refill on Hydrocodone/Acetaminophen 5-325  Mgs.  Qty  56  Sig: Take 1 tablet by mouth every 6 (six) hours as needed for moderate pain.  Patient states he uses [facility] on [address] 

## 2018-10-21 MED ORDER — HYDROCODONE-ACETAMINOPHEN 5-325 MG PO TABS: 1.0000 | ORAL_TABLET | Freq: Four times a day (QID) | ORAL | 0 refills | Status: DC | PRN

## 2018-11-04 ENCOUNTER — Other Ambulatory Visit: Payer: Self-pay | Admitting: Orthopedic Surgery

## 2018-11-04 DIAGNOSIS — G894 Chronic pain syndrome: Secondary | ICD-10-CM

## 2018-11-04 MED ORDER — HYDROCODONE-ACETAMINOPHEN 5-325 MG PO TABS: 1.0000 | ORAL_TABLET | Freq: Four times a day (QID) | ORAL | 0 refills | Status: DC | PRN

## 2018-11-04 NOTE — Telephone Encounter (Signed)
Patient requests refill: HYDROcodone-acetaminophen (NORCO/VICODIN) 5-325 MG tablet 56 tablet 0 10/21/2018    Sig - Route: Take 1 tablet by mouth every 6 (six) hours as needed for moderate pain. - Oral    [facility], [address] [place]

## 2018-11-17 ENCOUNTER — Other Ambulatory Visit: Payer: Self-pay | Admitting: Orthopedic Surgery

## 2018-11-17 DIAGNOSIS — G894 Chronic pain syndrome: Secondary | ICD-10-CM

## 2018-11-17 MED ORDER — HYDROCODONE-ACETAMINOPHEN 5-325 MG PO TABS: 1.0000 | ORAL_TABLET | Freq: Four times a day (QID) | ORAL | 0 refills | Status: DC | PRN

## 2018-11-17 NOTE — Telephone Encounter (Signed)
Hydrocodone-Acetaminophen  5/325 mg  QTY 56 Tablets  Take 1 tablet by mouth every 6 (six) hours as needed for moderate pain.  PATIENT USES [facility] ON [address]

## 2018-11-18 DIAGNOSIS — R05 Cough: Secondary | ICD-10-CM | POA: Diagnosis not present

## 2018-11-26 DIAGNOSIS — M79671 Pain in right foot: Secondary | ICD-10-CM | POA: Diagnosis not present

## 2018-11-26 DIAGNOSIS — E1140 Type 2 diabetes mellitus with diabetic neuropathy, unspecified: Secondary | ICD-10-CM | POA: Diagnosis not present

## 2018-11-26 DIAGNOSIS — M79674 Pain in right toe(s): Secondary | ICD-10-CM | POA: Diagnosis not present

## 2018-11-26 DIAGNOSIS — M79672 Pain in left foot: Secondary | ICD-10-CM | POA: Diagnosis not present

## 2018-11-26 DIAGNOSIS — L110 Acquired keratosis follicularis: Secondary | ICD-10-CM | POA: Diagnosis not present

## 2018-12-01 ENCOUNTER — Other Ambulatory Visit: Payer: Self-pay | Admitting: Orthopedic Surgery

## 2018-12-01 DIAGNOSIS — G894 Chronic pain syndrome: Secondary | ICD-10-CM

## 2018-12-01 MED ORDER — HYDROCODONE-ACETAMINOPHEN 5-325 MG PO TABS: 1.0000 | ORAL_TABLET | Freq: Four times a day (QID) | ORAL | 0 refills | Status: DC | PRN

## 2018-12-01 NOTE — Telephone Encounter (Signed)
Hydrocodone-Acetaminophen  5/325 mg  Qty 56 Tablets  Take 1 tablet by mouth every 6(six) hours as needed.  PATIENT USES [facility] ON [address]

## 2018-12-16 ENCOUNTER — Other Ambulatory Visit: Payer: Self-pay | Admitting: Orthopedic Surgery

## 2018-12-16 DIAGNOSIS — G894 Chronic pain syndrome: Secondary | ICD-10-CM

## 2018-12-16 NOTE — Telephone Encounter (Signed)
Patient requests refill on Hydrocodone/Acetaminophen 5-325 mgs.  Qty  56       Sig: Take 1 tablet by mouth every 6 (six) hours as needed for moderate pain.    Patient states he uses [facility] on [address]

## 2018-12-18 NOTE — Telephone Encounter (Signed)
Patient came into office today and inquired about the Rx refill request he made.  Please advise on refill?

## 2018-12-19 MED ORDER — HYDROCODONE-ACETAMINOPHEN 5-325 MG PO TABS: 1.0000 | ORAL_TABLET | Freq: Four times a day (QID) | ORAL | 0 refills | Status: DC | PRN

## 2018-12-22 DIAGNOSIS — I10 Essential (primary) hypertension: Secondary | ICD-10-CM | POA: Diagnosis not present

## 2018-12-22 DIAGNOSIS — E1169 Type 2 diabetes mellitus with other specified complication: Secondary | ICD-10-CM | POA: Diagnosis not present

## 2019-01-01 ENCOUNTER — Other Ambulatory Visit: Payer: Self-pay | Admitting: Orthopedic Surgery

## 2019-01-01 DIAGNOSIS — G894 Chronic pain syndrome: Secondary | ICD-10-CM

## 2019-01-01 NOTE — Telephone Encounter (Signed)
Patient called to request refill: HYDROcodone-acetaminophen (NORCO/VICODIN) 5-325 MG tablet 56 tablet 0  -[facility], [address], [place]*   *aware of his appointment on 01/07/19

## 2019-01-05 MED ORDER — HYDROCODONE-ACETAMINOPHEN 5-325 MG PO TABS: 1.0000 | ORAL_TABLET | Freq: Four times a day (QID) | ORAL | 0 refills | Status: DC | PRN

## 2019-01-05 NOTE — Telephone Encounter (Signed)
Send thru pharmacy like usual

## 2019-01-05 NOTE — Telephone Encounter (Signed)
I did not route to you or I would have, thanks / for some reason Arbie Cookey routed to you not me

## 2019-01-07 ENCOUNTER — Other Ambulatory Visit: Payer: Self-pay

## 2019-01-07 ENCOUNTER — Ambulatory Visit (INDEPENDENT_AMBULATORY_CARE_PROVIDER_SITE_OTHER): Payer: Medicare Other | Admitting: Orthopedic Surgery

## 2019-01-07 VITALS — BP 149/88 | HR 83 | Temp 98.0°F | Ht 68.0 in | Wt 272.0 lb

## 2019-01-07 DIAGNOSIS — G8929 Other chronic pain: Secondary | ICD-10-CM | POA: Diagnosis not present

## 2019-01-07 DIAGNOSIS — M170 Bilateral primary osteoarthritis of knee: Secondary | ICD-10-CM | POA: Diagnosis not present

## 2019-01-07 DIAGNOSIS — M25512 Pain in left shoulder: Secondary | ICD-10-CM | POA: Diagnosis not present

## 2019-01-07 DIAGNOSIS — M25511 Pain in right shoulder: Secondary | ICD-10-CM

## 2019-01-07 DIAGNOSIS — G894 Chronic pain syndrome: Secondary | ICD-10-CM

## 2019-01-07 NOTE — Progress Notes (Signed)
Chief Complaint  Patient presents with  . Follow-up    6 month recheck on bilateral knees.    Encounter Diagnoses  Name Primary?  . Primary osteoarthritis of both knees Yes  . Chronic pain of both shoulders   . Chronic pain syndrome    History 53 year old male with above diagnoses presents for routine follow-up.  He has no complaints he says his knees and shoulders are doing very well.  He says his medication is working good he is taking hydrocodone 5 mg every 6 hours for pain occasional Tylenol  He is able to walk several minutes now without pain  Review of Systems  Musculoskeletal: Positive for joint pain.  Neurological: Negative for tingling and sensory change.   BP (!) 149/88   Pulse 83   Temp 98 F (36.7 C)   Ht 5\' 8"  (1.727 m)   Wt 272 lb (123.4 kg)   BMI 41.36 kg/m  Physical Exam Vitals signs and nursing note reviewed.  Constitutional:      Appearance: Normal appearance.  Musculoskeletal:     Right shoulder: He exhibits normal range of motion and no bony tenderness.     Left shoulder: He exhibits normal range of motion and no bony tenderness.     Right knee: No tenderness found.     Left knee: No tenderness found.  Neurological:     Mental Status: He is alert and oriented to person, place, and time.     Gait: Gait is intact. Gait normal.  Psychiatric:        Mood and Affect: Mood normal.     Terry Case will come back in 6 months for x-rays of his knees  He should continue his hydrocodone and call for refills recent refill was given  Continue exercise

## 2019-01-20 ENCOUNTER — Other Ambulatory Visit: Payer: Self-pay | Admitting: Orthopedic Surgery

## 2019-01-20 DIAGNOSIS — G894 Chronic pain syndrome: Secondary | ICD-10-CM

## 2019-01-20 MED ORDER — HYDROCODONE-ACETAMINOPHEN 5-325 MG PO TABS: 1.0000 | ORAL_TABLET | Freq: Four times a day (QID) | ORAL | 0 refills | Status: DC | PRN

## 2019-01-20 NOTE — Telephone Encounter (Signed)
Patient requests refill on Hydrocodone/Acetaminophen 5-325  Mgs.  Qty  65  Sig: Take 1 tablet by mouth every 6 (six) hours as needed for moderate pain.  Patient states he uses [facility] on [address]

## 2019-02-02 ENCOUNTER — Other Ambulatory Visit: Payer: Self-pay | Admitting: Orthopedic Surgery

## 2019-02-02 DIAGNOSIS — G894 Chronic pain syndrome: Secondary | ICD-10-CM

## 2019-02-02 MED ORDER — HYDROCODONE-ACETAMINOPHEN 5-325 MG PO TABS: 1.0000 | ORAL_TABLET | Freq: Four times a day (QID) | ORAL | 0 refills | Status: DC | PRN

## 2019-02-02 NOTE — Telephone Encounter (Signed)
Patient requests refill on Hydrocodone/Actaminophen 5-325  Mgs.  Qty  56  Sig: Take 1 tablet by mouth every 6 (six) hours as needed for moderate pain.  Patient states [facility] uses [facility] [address].

## 2019-02-16 ENCOUNTER — Telehealth: Payer: Self-pay | Admitting: Orthopedic Surgery

## 2019-02-16 DIAGNOSIS — E1169 Type 2 diabetes mellitus with other specified complication: Secondary | ICD-10-CM | POA: Diagnosis not present

## 2019-02-16 DIAGNOSIS — I10 Essential (primary) hypertension: Secondary | ICD-10-CM | POA: Diagnosis not present

## 2019-02-16 DIAGNOSIS — G894 Chronic pain syndrome: Secondary | ICD-10-CM

## 2019-02-16 NOTE — Telephone Encounter (Signed)
Patient of Dr Harrison requests refill on Hydrocodone/Acetaminophen 5-325 mgs.  Qty 56  Sig: Take 1 tablet by mouth every 6 (six) hours as needed for moderate pain.  Patient states he uses [facility] on [address] 

## 2019-02-17 MED ORDER — HYDROCODONE-ACETAMINOPHEN 5-325 MG PO TABS: 1.0000 | ORAL_TABLET | Freq: Four times a day (QID) | ORAL | 0 refills | Status: DC | PRN

## 2019-02-18 DIAGNOSIS — L110 Acquired keratosis follicularis: Secondary | ICD-10-CM | POA: Diagnosis not present

## 2019-02-18 DIAGNOSIS — M79671 Pain in right foot: Secondary | ICD-10-CM | POA: Diagnosis not present

## 2019-02-18 DIAGNOSIS — M79674 Pain in right toe(s): Secondary | ICD-10-CM | POA: Diagnosis not present

## 2019-02-18 DIAGNOSIS — M79672 Pain in left foot: Secondary | ICD-10-CM | POA: Diagnosis not present

## 2019-02-18 DIAGNOSIS — E1140 Type 2 diabetes mellitus with diabetic neuropathy, unspecified: Secondary | ICD-10-CM | POA: Diagnosis not present

## 2019-03-03 ENCOUNTER — Other Ambulatory Visit: Payer: Self-pay | Admitting: Radiology

## 2019-03-03 DIAGNOSIS — G894 Chronic pain syndrome: Secondary | ICD-10-CM

## 2019-03-03 MED ORDER — HYDROCODONE-ACETAMINOPHEN 5-325 MG PO TABS: 1.0000 | ORAL_TABLET | Freq: Four times a day (QID) | ORAL | 0 refills | Status: DC | PRN

## 2019-03-03 NOTE — Telephone Encounter (Signed)
Pt calls, requests refill hydrocodone.   [place]

## 2019-03-16 ENCOUNTER — Other Ambulatory Visit: Payer: Self-pay | Admitting: Orthopedic Surgery

## 2019-03-16 DIAGNOSIS — G894 Chronic pain syndrome: Secondary | ICD-10-CM

## 2019-03-16 MED ORDER — HYDROCODONE-ACETAMINOPHEN 5-325 MG PO TABS: 1.0000 | ORAL_TABLET | Freq: Four times a day (QID) | ORAL | 0 refills | Status: DC | PRN

## 2019-03-16 NOTE — Telephone Encounter (Signed)
Patient requests refill on Hydrocodone/Acetaminophen 5-325  Mgs.   Qty  56  Sig: Take 1 tablet by mouth every 6 (six) hours as needed for moderate pain.       Patient states he uses [facility] on [address] 

## 2019-03-30 ENCOUNTER — Other Ambulatory Visit: Payer: Self-pay | Admitting: Orthopedic Surgery

## 2019-03-30 DIAGNOSIS — G894 Chronic pain syndrome: Secondary | ICD-10-CM

## 2019-03-30 MED ORDER — HYDROCODONE-ACETAMINOPHEN 5-325 MG PO TABS: 1.0000 | ORAL_TABLET | Freq: Four times a day (QID) | ORAL | 0 refills | Status: DC | PRN

## 2019-03-30 NOTE — Telephone Encounter (Signed)
Patient called for refill: HYDROcodone-acetaminophen (NORCO/VICODIN) 5-325 MG tablet 56 tablet  [facility], [address], [place]  

## 2019-03-31 DIAGNOSIS — H6501 Acute serous otitis media, right ear: Secondary | ICD-10-CM | POA: Diagnosis not present

## 2019-03-31 DIAGNOSIS — R51 Headache: Secondary | ICD-10-CM | POA: Diagnosis not present

## 2019-03-31 DIAGNOSIS — E1169 Type 2 diabetes mellitus with other specified complication: Secondary | ICD-10-CM | POA: Diagnosis not present

## 2019-04-13 ENCOUNTER — Other Ambulatory Visit: Payer: Self-pay

## 2019-04-13 DIAGNOSIS — G894 Chronic pain syndrome: Secondary | ICD-10-CM

## 2019-04-13 MED ORDER — HYDROCODONE-ACETAMINOPHEN 5-325 MG PO TABS: 1.0000 | ORAL_TABLET | Freq: Four times a day (QID) | ORAL | 0 refills | Status: DC | PRN

## 2019-04-13 NOTE — Telephone Encounter (Signed)
Hydrocodone-Acetaminophen  5/325 mg  Qty 56 Tablets  Take 1 tablet by mouth every 6 (six) hours as needed for moderate pain.  PATIENT USES [facility]

## 2019-04-27 ENCOUNTER — Other Ambulatory Visit: Payer: Self-pay | Admitting: Orthopedic Surgery

## 2019-04-27 DIAGNOSIS — G894 Chronic pain syndrome: Secondary | ICD-10-CM

## 2019-04-27 MED ORDER — HYDROCODONE-ACETAMINOPHEN 5-325 MG PO TABS: 1.0000 | ORAL_TABLET | Freq: Four times a day (QID) | ORAL | 0 refills | Status: DC | PRN

## 2019-04-27 NOTE — Telephone Encounter (Signed)
Patient requests refill: Hydrocodone-acetaminophen (NORCO/VICODIN) 5-325 MG tablet / 56 tablets [place]

## 2019-04-29 DIAGNOSIS — M79671 Pain in right foot: Secondary | ICD-10-CM | POA: Diagnosis not present

## 2019-04-29 DIAGNOSIS — E1140 Type 2 diabetes mellitus with diabetic neuropathy, unspecified: Secondary | ICD-10-CM | POA: Diagnosis not present

## 2019-04-29 DIAGNOSIS — L110 Acquired keratosis follicularis: Secondary | ICD-10-CM | POA: Diagnosis not present

## 2019-04-29 DIAGNOSIS — M79672 Pain in left foot: Secondary | ICD-10-CM | POA: Diagnosis not present

## 2019-04-29 DIAGNOSIS — M79674 Pain in right toe(s): Secondary | ICD-10-CM | POA: Diagnosis not present

## 2019-04-30 ENCOUNTER — Ambulatory Visit: Payer: Medicare Other | Admitting: Internal Medicine

## 2019-05-04 ENCOUNTER — Ambulatory Visit (INDEPENDENT_AMBULATORY_CARE_PROVIDER_SITE_OTHER): Payer: Medicare Other | Admitting: Internal Medicine

## 2019-05-04 ENCOUNTER — Encounter: Payer: Self-pay | Admitting: Internal Medicine

## 2019-05-04 ENCOUNTER — Other Ambulatory Visit: Payer: Self-pay

## 2019-05-04 DIAGNOSIS — Z23 Encounter for immunization: Secondary | ICD-10-CM | POA: Diagnosis not present

## 2019-05-04 DIAGNOSIS — J449 Chronic obstructive pulmonary disease, unspecified: Secondary | ICD-10-CM | POA: Diagnosis not present

## 2019-05-04 DIAGNOSIS — I10 Essential (primary) hypertension: Secondary | ICD-10-CM | POA: Diagnosis not present

## 2019-05-04 LAB — TSH: TSH: 1.07 u[IU]/mL (ref 0.35–4.50)

## 2019-05-04 NOTE — Progress Notes (Signed)
Subjective:     Patient ID: Terry Case, male   DOB: [birth year],    MRN: [id number]  Brief patient profile:  53 yobm quit smoking 08/2011 with chest congestion/ cough and resolved with no cough and good activity tol though no aerobics referred to pulmonary clinic 08/23/2016 by Dr   Iona Beard   History of Present Illness  08/23/2016 1st [facility] office visit/ Wert    Chief Complaint  Patient presents with  . Pulmonary Consult    Referred by Dr. Mercie Eon. Pt states that he had been coughing for about 2 1/2 wks, but this has already and no co's today.   variable cough since quit smoking better with saba hasn't used in sev weeks    Able to do yardwork, steps s sob now  rec Only use your albuterol (proair (RED) or proventil (YELLOW)  As needed    05/04/2019  f/u ov/Wert re:  Mild asthma/ MO complicated by djd knees Chief Complaint  Patient presents with  . Follow-up    Breathing is overall doing well. He uses his albuterol inhaler 3 x per month on average.   Dyspnea:  Limited by  Both knees than breathing  Cough: none  Sleeping: supposed to be on cpap, seeing Ron Parker for oral  appliance as can't tol cpap  SABA use: as above  02: none    No obvious day to day or daytime variability or assoc excess/ purulent sputum or mucus plugs or hemoptysis or cp or chest tightness, subjective wheeze or overt sinus or hb symptoms.     Also denies any obvious fluctuation of symptoms with weather or environmental changes or other aggravating or alleviating factors except as outlined above   No unusual exposure hx or h/o childhood pna/ asthma or knowledge of premature birth.  Current Allergies, Complete Past Medical History, Past Surgical History, Family History, and Social History were reviewed in [employer] record.  ROS  The following are not active complaints unless bolded Hoarseness, sore throat, dysphagia, dental problems, itching, sneezing,  nasal congestion  or discharge of excess mucus or purulent secretions, ear ache,   fever, chills, sweats, unintended wt loss or wt gain, classically pleuritic or exertional cp,  orthopnea pnd or arm/hand swelling  or leg swelling, presyncope, palpitations, abdominal pain, anorexia, nausea, vomiting, diarrhea  or change in bowel habits or change in bladder habits, change in stools or change in urine, dysuria, hematuria,  rash, arthralgias, visual complaints, headache, numbness, weakness or ataxia or problems with walking or coordination,  change in mood or  memory.        Current Meds - - NOTE:   Unable to verify as accurately reflecting what pt takes   says he's not taking lisinopril   Medication Sig  . albuterol (PROVENTIL HFA;VENTOLIN HFA) 108 (90 Base) MCG/ACT inhaler INHALE 2 PUFFS BY MOUTH EVERY 4 HOURS AS NEEDED IF YOU CAN'T CATCH YOUR BREATH (Patient taking differently: Inhale 2 puffs into the lungs every 4 (four) hours as needed for wheezing or shortness of breath. INHALE 2 PUFFS BY MOUTH EVERY 4 HOURS AS NEEDED IF YOU CAN'T CATCH YOUR BREATH)  . aspirin 81 MG tablet Take 81 mg by mouth daily.  Marland Kitchen atorvastatin (LIPITOR) 20 MG tablet Take 20 mg by mouth at bedtime.  . diphenhydrAMINE (BENADRYL) 25 MG tablet Take 25 mg by mouth every 6 (six) hours as needed.  Marland Kitchen guaifenesin (HUMIBID E) 400 MG TABS tablet Take 400 mg by mouth  every 4 (four) hours as needed.  . halobetasol (ULTRAVATE) 0.05 % cream Apply 1 application topically 2 (two) times daily as needed (eczema).   Marland Kitchen HYDROcodone-acetaminophen (NORCO/VICODIN) 5-325 MG tablet Take 1 tablet by mouth every 6 (six) hours as needed for moderate pain.  Marland Kitchen JANUMET XR [phone] MG TB24 Take 1 tablet by mouth every morning.   Marland Kitchen lisinopril (PRINIVIL,ZESTRIL) 10 MG tablet Take 10 mg by mouth daily.   Marland Kitchen loratadine (CLARITIN) 10 MG tablet Take 10 mg by mouth daily as needed for allergies.   Marland Kitchen omeprazole (PRILOSEC) 20 MG capsule Take 20 mg by mouth daily.              Objective:   Physical Exam    Pleasant amb bm MO by BMI   BP 126/74 (BP Location: Left Arm, Cuff Size: Normal)   Pulse 84   Temp 98.1 F (36.7 C) (Temporal)   Ht 5\' 8"  (1.727 m)   Wt 279 lb (126.6 kg)   SpO2 93% Comment: on RA  BMI 42.42 kg/m    05/04/2019       279   08/23/16 277 lb (125.6 kg)  07/27/16 287 lb (130.2 kg)  11/16/15 270 lb (122.5 kg)     HEENT : pt wearing mask not removed for exam due to covid -19 concerns.    NECK :  without JVD/Nodes/TM/ nl carotid upstrokes bilaterally   LUNGS: no acc muscle use,  Nl contour chest which is clear to A and P bilaterally without cough on insp or exp maneuvers   CV:  RRR  no s3 or murmur or increase in P2, and no edema   ABD: obese soft and nontender with nl inspiratory excursion in the supine position. No bruits or organomegaly appreciated, bowel sounds nl  MS:  Nl gait/ ext warm without deformities, calf tenderness, cyanosis or clubbing No obvious joint restrictions   SKIN: warm and dry without lesions    NEURO:  alert, approp, nl sensorium with  no motor or cerebellar deficits apparent.       Assessment:

## 2019-05-04 NOTE — Patient Instructions (Addendum)
Weight control is simply a matter of calorie balance which needs to be tilted in your favor by eating less and exercising more.  To get the most out of exercise, you need to be continuously aware that you are short of breath, but never out of breath, for 30 minutes daily. As you improve, it will actually be easier for you to do the same amount of exercise  in  30 minutes so always push to the level where you are short of breath.    We will call to schedule nutrition eval at [facility]   Work on inhaler technique:  relax and gently blow all the way out then take a nice smooth deep breath back in, triggering the inhaler at same time you start breathing in.  Hold for up to 5 seconds if you can. Blow out thru nose. Rinse and gargle with water when done.     Only use your albuterol as a rescue medication to be used if you can't catch your breath by resting or doing a relaxed purse lip breathing pattern.  - The less you use it, the better it will work when you need it. - Ok to use up to 2 puffs  every 4 hours if you must but call for immediate appointment if use goes up over your usual need - Don't leave home without it !!  (think of it like the spare tire for your car)   Please remember to go to the lab department   for your tests - we will call you with the results when they are available.       Please schedule a follow up visit in 12 months but call sooner if needed  - we can see if [facility] if we are set up there then

## 2019-05-04 NOTE — Progress Notes (Signed)
Spoke with pt and notified of results per Dr. Wert. Pt verbalized understanding and denied any questions. 

## 2019-05-05 ENCOUNTER — Encounter: Payer: Self-pay | Admitting: Internal Medicine

## 2019-05-05 NOTE — Assessment & Plan Note (Signed)
Says not taking lisinopril but confused about names of meds  Adequate control on present rx, reviewed in detail with pt > no change in rx needed  But would avoid acei if develops non-specific symptoms that overlap with airways dz (cough/ "wheeze"/ unexplained sob).

## 2019-05-05 NOTE — Assessment & Plan Note (Signed)
Quit smoking 2013  08/23/2016  After extensive coaching HFA effectiveness =    75% from baseline 25% - Spirometry 08/23/2016  Very minimal obst on f/v curve  - The proper method of use, as well as anticipated side effects, of a metered-dose inhaler are discussed and demonstrated to the patient. Improved effectiveness after extensive coaching during this visit to a level of approximately 75 % from a baseline of 50 % (short Ti )  All goals of chronic asthma control met including optimal function and elimination of symptoms with minimal need for rescue therapy.  Contingencies discussed in full including contacting this office immediately if not controlling the symptoms using the rule of two's.

## 2019-05-11 ENCOUNTER — Other Ambulatory Visit: Payer: Self-pay | Admitting: Orthopedic Surgery

## 2019-05-11 DIAGNOSIS — G894 Chronic pain syndrome: Secondary | ICD-10-CM

## 2019-05-11 MED ORDER — HYDROCODONE-ACETAMINOPHEN 5-325 MG PO TABS: 1.0000 | ORAL_TABLET | Freq: Four times a day (QID) | ORAL | 0 refills | Status: DC | PRN

## 2019-05-11 NOTE — Telephone Encounter (Signed)
Patient called for refill: HYDROcodone-acetaminophen (NORCO/VICODIN) 5-325 MG tablet 56 tablet  [facility], [address], [place]  

## 2019-05-18 ENCOUNTER — Other Ambulatory Visit: Payer: Self-pay | Admitting: Cardiology

## 2019-05-18 DIAGNOSIS — Z20822 Contact with and (suspected) exposure to covid-19: Secondary | ICD-10-CM

## 2019-05-20 LAB — NOVEL CORONAVIRUS, NAA: SARS-CoV-2, NAA: NOT DETECTED

## 2019-05-21 ENCOUNTER — Telehealth: Payer: Self-pay | Admitting: General Practice

## 2019-05-21 NOTE — Telephone Encounter (Signed)
Negative COVID results given. Patient results "NOT Detected." Caller expressed understanding. ° °

## 2019-05-25 ENCOUNTER — Other Ambulatory Visit: Payer: Self-pay | Admitting: Orthopedic Surgery

## 2019-05-25 DIAGNOSIS — I10 Essential (primary) hypertension: Secondary | ICD-10-CM | POA: Diagnosis not present

## 2019-05-25 DIAGNOSIS — J219 Acute bronchiolitis, unspecified: Secondary | ICD-10-CM | POA: Diagnosis not present

## 2019-05-25 DIAGNOSIS — E1169 Type 2 diabetes mellitus with other specified complication: Secondary | ICD-10-CM | POA: Diagnosis not present

## 2019-05-25 DIAGNOSIS — G894 Chronic pain syndrome: Secondary | ICD-10-CM

## 2019-05-25 DIAGNOSIS — M7918 Myalgia, other site: Secondary | ICD-10-CM | POA: Diagnosis not present

## 2019-05-25 MED ORDER — HYDROCODONE-ACETAMINOPHEN 5-325 MG PO TABS: 1.0000 | ORAL_TABLET | Freq: Four times a day (QID) | ORAL | 0 refills | Status: DC | PRN

## 2019-05-25 NOTE — Telephone Encounter (Signed)
Patient requests refill:  HYDROcodone-acetaminophen (NORCO/VICODIN) 5-325 MG tablet  - [facility], [address], [place]

## 2019-06-08 ENCOUNTER — Other Ambulatory Visit: Payer: Self-pay

## 2019-06-08 DIAGNOSIS — G894 Chronic pain syndrome: Secondary | ICD-10-CM

## 2019-06-08 MED ORDER — HYDROCODONE-ACETAMINOPHEN 5-325 MG PO TABS: 1.0000 | ORAL_TABLET | Freq: Four times a day (QID) | ORAL | 0 refills | Status: DC | PRN

## 2019-06-08 NOTE — Telephone Encounter (Signed)
Hydrocodone-Acetaminophen 5/325 mg  Qty 56 Tablets  Take 1 tablet by mouth every 6 (six) hours as needed for moderate pain.  PATIENT  USES [facility] [address]

## 2019-06-09 DIAGNOSIS — J219 Acute bronchiolitis, unspecified: Secondary | ICD-10-CM | POA: Diagnosis not present

## 2019-06-09 DIAGNOSIS — E1169 Type 2 diabetes mellitus with other specified complication: Secondary | ICD-10-CM | POA: Diagnosis not present

## 2019-06-29 ENCOUNTER — Encounter: Payer: Medicare Other | Attending: Internal Medicine | Admitting: Nutrition

## 2019-06-29 ENCOUNTER — Other Ambulatory Visit: Payer: Self-pay

## 2019-06-29 ENCOUNTER — Encounter: Payer: Self-pay | Admitting: Nutrition

## 2019-06-29 DIAGNOSIS — E1165 Type 2 diabetes mellitus with hyperglycemia: Secondary | ICD-10-CM | POA: Insufficient documentation

## 2019-06-29 DIAGNOSIS — E118 Type 2 diabetes mellitus with unspecified complications: Secondary | ICD-10-CM | POA: Diagnosis not present

## 2019-06-29 DIAGNOSIS — I10 Essential (primary) hypertension: Secondary | ICD-10-CM

## 2019-06-29 DIAGNOSIS — IMO0002 Reserved for concepts with insufficient information to code with codable children: Secondary | ICD-10-CM

## 2019-06-29 NOTE — Patient Instructions (Signed)
Goals Follow My Plate  Eat three balanced meals per day Cut out snacks Cut out junk food, fast food, sweets, desserits Drink only water  Walk 30 minutes a day Watch portions Test blood sugars twice a day Lsoe 1-2 lbs per week Get A1C to 6.5% or less.

## 2019-06-29 NOTE — Progress Notes (Addendum)
  Medical Nutrition Therapy:  Appt start time: 0900 end time:  1000.   Assessment:  Primary concerns today: Diabetes and Obesity. Dx Dm x 3 years. Lives with his girlfriend. Wants to lose weight. He cooks most of food at home and admits to problems with eating the wrong foods.. Has been walking. . Eat 3 meals a day and 2 snacks. FBS 103, 120's  Usually. Checks BS occasionally. Doesn't usually check blood sugars in the evenings.  A1C reported to be 6.0% per PCP office. PCP Dr. Berdine Addison. Difficulty with reading and writing. Motivated to changes diet and exercise to lose weight and improve his DM.  No recent A1C. Will request labs from PCP office. Currently taking Janumet 100/1000 mg   Preferred Learning Style:  Auditory  Visual-limited reading ability but likes to see things to learn  Hands on  Learning Readiness:   Ready  Change in progress   MEDICATIONS:    DIETARY INTAKE:  24-hr recall:  B ( 8 AM): Honey nut cherrios  2 c,  Whole milk,  Snk ( AM):  L ( 1 ): Toss salad- ranch and steak-8 oz, water Snk ( PM):water D ( PM): [place] cherrios, 2 cups. Whole milk,  Snk ( PM): Apple juice  Beverages: water, juice  Usual physical activity: walks some.  Estimated energy needs: 1800 calories 200g carbohydrates 135g protein 50 g fat  Progress Towards Goal(s):  In progress.   Nutritional Diagnosis:  NB-1.1 Food and nutrition-related knowledge deficit As related to DIabetes Type 2.  As evidenced by A1C > 6.5%.    Intervention:  Nutrition and Diabetes education provided on My Plate, CHO counting, meal planning, portion sizes, timing of meals, avoiding snacks between meals unless having a low blood sugar, target ranges for A1C and blood sugars, signs/symptoms and treatment of hyper/hypoglycemia, monitoring blood sugars, taking medications as prescribed, benefits of exercising 30 minutes per day and prevention of complications of DM.    Goals Follow My Plate  Eat three balanced  meals per day Cut out snacks Cut out junk food, fast food, sweets, desserits Drink only water  Walk 30 minutes a day Watch portions Test blood sugars twice a day Lsoe 1-2 lbs per week Get A1C to 6.5% or less.  Teaching Method Utilized:  Visual Auditory Hands on  Handouts given during visit include:  The Plate Method   Visual plate ideas   Barriers to learning/adherence to lifestyle change: low reading and writing ability  Demonstrated degree of understanding via:  Teach Back   Monitoring/Evaluation:  Dietary intake, exercise, , and body weight in 1 month(s).

## 2019-06-30 ENCOUNTER — Other Ambulatory Visit: Payer: Self-pay | Admitting: Orthopedic Surgery

## 2019-06-30 DIAGNOSIS — G894 Chronic pain syndrome: Secondary | ICD-10-CM

## 2019-06-30 NOTE — Telephone Encounter (Signed)
Patient requests refill on Hydrocodone/Acetaminophen 5-325  Mgs.  Qty  56  Sig: Take 1 tablet by mouth every 6 (six) hours as needed for moderate pain.  Patient states he uses [facility] on [address] 

## 2019-07-01 MED ORDER — HYDROCODONE-ACETAMINOPHEN 5-325 MG PO TABS: 1.0000 | ORAL_TABLET | Freq: Four times a day (QID) | ORAL | 0 refills | Status: DC | PRN

## 2019-07-08 ENCOUNTER — Ambulatory Visit (INDEPENDENT_AMBULATORY_CARE_PROVIDER_SITE_OTHER): Payer: Medicare Other | Admitting: Orthopedic Surgery

## 2019-07-08 ENCOUNTER — Other Ambulatory Visit: Payer: Self-pay

## 2019-07-08 ENCOUNTER — Encounter: Payer: Self-pay | Admitting: Orthopedic Surgery

## 2019-07-08 ENCOUNTER — Ambulatory Visit: Payer: Medicare Other

## 2019-07-08 VITALS — BP 131/78 | HR 66 | Ht 68.0 in | Wt 273.0 lb

## 2019-07-08 DIAGNOSIS — G5601 Carpal tunnel syndrome, right upper limb: Secondary | ICD-10-CM

## 2019-07-08 DIAGNOSIS — G5602 Carpal tunnel syndrome, left upper limb: Secondary | ICD-10-CM

## 2019-07-08 DIAGNOSIS — M1712 Unilateral primary osteoarthritis, left knee: Secondary | ICD-10-CM | POA: Diagnosis not present

## 2019-07-08 DIAGNOSIS — M1711 Unilateral primary osteoarthritis, right knee: Secondary | ICD-10-CM

## 2019-07-08 DIAGNOSIS — M170 Bilateral primary osteoarthritis of knee: Secondary | ICD-10-CM
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
## 2019-07-08 NOTE — Patient Instructions (Signed)
X-rays today show no progression of your arthritis so that stable  Continue your hydrocodone for pain call us for refills  As far as the carpal tunnel goes wear the braces as needed  Continue to maintain a good weight as you are doing  Follow-up in 1 year

## 2019-07-16 ENCOUNTER — Other Ambulatory Visit: Payer: Self-pay | Admitting: Orthopedic Surgery

## 2019-07-16 DIAGNOSIS — G894 Chronic pain syndrome: Secondary | ICD-10-CM

## 2019-07-16 NOTE — Telephone Encounter (Signed)
Patient requests refill Hydrocodone/Acetaminophen 5-325  Mgs.  Qty  56  Sig: Take 1 tablet by mouth every 6 (six) hours as needed for moderate pain.  Patient states he uses [facility] on [address]

## 2019-07-17 MED ORDER — HYDROCODONE-ACETAMINOPHEN 5-325 MG PO TABS: 1.0000 | ORAL_TABLET | Freq: Four times a day (QID) | ORAL | 0 refills | Status: DC | PRN

## 2019-07-29 DIAGNOSIS — M79671 Pain in right foot: Secondary | ICD-10-CM | POA: Diagnosis not present

## 2019-07-29 DIAGNOSIS — L110 Acquired keratosis follicularis: Secondary | ICD-10-CM | POA: Diagnosis not present

## 2019-07-29 DIAGNOSIS — E1140 Type 2 diabetes mellitus with diabetic neuropathy, unspecified: Secondary | ICD-10-CM | POA: Diagnosis not present

## 2019-07-29 DIAGNOSIS — M79674 Pain in right toe(s): Secondary | ICD-10-CM | POA: Diagnosis not present

## 2019-07-29 DIAGNOSIS — M79672 Pain in left foot: Secondary | ICD-10-CM | POA: Diagnosis not present

## 2019-08-03 ENCOUNTER — Other Ambulatory Visit: Payer: Self-pay | Admitting: Orthopedic Surgery

## 2019-08-03 DIAGNOSIS — G894 Chronic pain syndrome: Secondary | ICD-10-CM

## 2019-08-03 MED ORDER — HYDROCODONE-ACETAMINOPHEN 5-325 MG PO TABS: 1.0000 | ORAL_TABLET | Freq: Four times a day (QID) | ORAL | 0 refills | Status: DC | PRN

## 2019-08-03 NOTE — Telephone Encounter (Signed)
Patient called for refill: HYDROcodone-acetaminophen (NORCO/VICODIN) 5-325 MG tablet 56 tablet 0 07/16/18  - [facility], [address] [place]

## 2019-08-04 ENCOUNTER — Encounter: Payer: Self-pay | Admitting: Nutrition

## 2019-08-04 ENCOUNTER — Encounter: Payer: Medicare Other | Attending: "Endocrinology | Admitting: Nutrition

## 2019-08-04 ENCOUNTER — Other Ambulatory Visit: Payer: Self-pay

## 2019-08-04 VITALS — Ht 68.0 in | Wt 265.0 lb

## 2019-08-04 DIAGNOSIS — I10 Essential (primary) hypertension: Secondary | ICD-10-CM

## 2019-08-04 DIAGNOSIS — Z6841 Body Mass Index (BMI) 40.0 and over, adult: Secondary | ICD-10-CM | POA: Insufficient documentation

## 2019-08-04 DIAGNOSIS — Z713 Dietary counseling and surveillance: Secondary | ICD-10-CM | POA: Diagnosis not present

## 2019-08-04 NOTE — Progress Notes (Signed)
  Medical Nutrition Therapy:  Appt start time: 1000 end time:  1030.   Assessment:  Primary concerns today: Diabetes and Obesity. Dx Dm x 3 years.   FBS  95-101 mg/d;. Bedtime 88-115 mg/dl Lost 8 lbs.  Just started prilosec.Marland Kitchen Using more fresh vegetables and fresh fruit,  Motivated to changes diet and exercise to lose weight and improve his DM.  No recent A1C. Will request labs from PCP office. Currently taking Janumet 100/1000 mg   Preferred Learning Style:  Auditory  Visual-limited reading ability but likes to see things to learn  Hands on  Learning Readiness:   Ready  Change in progress   MEDICATIONS:    DIETARY INTAKE:  24-hr recall:  B ( 8 AM):  Fruit, and eggs  Snk ( AM):  L ( 1 ): Toss salad- ranch and steak-8 oz, water Snk ( PM):water D ( PM): [place] cherrios, 2 cups. Whole milk,  Snk ( PM): Apple juice  Beverages: water, juice  Usual physical activity: walks some.  Estimated energy needs: 1800 calories 200g carbohydrates 135g protein 50 g fat  Progress Towards Goal(s):  In progress.   Nutritional Diagnosis:  NB-1.1 Food and nutrition-related knowledge deficit As related to DIabetes Type 2.  As evidenced by A1C > 6.5%.    Intervention:  Nutrition and Diabetes education provided on My Plate, CHO counting, meal planning, portion sizes, timing of meals, avoiding snacks between meals unless having a low blood sugar, target ranges for A1C and blood sugars, signs/symptoms and treatment of hyper/hypoglycemia, monitoring blood sugars, taking medications as prescribed, benefits of exercising 30 minutes per day and prevention of complications of DM.    Keep up the great job!  Eat 45-60 grams of carbs per meal Keep walking Avoid snacks Continue to increase high fiber foods and whole grains in diet Lose 1-2 lb per week Get A1C to 6.5% or less Get A1C checked.  Teaching Method Utilized:  Visual Auditory Hands on  Handouts given during visit  include:  The Plate Method   Visual plate ideas   Barriers to learning/adherence to lifestyle change: low reading and writing ability  Demonstrated degree of understanding via:  Teach Back   Monitoring/Evaluation:  Dietary intake, exercise, , and body weight in 3 month(s).

## 2019-08-04 NOTE — Patient Instructions (Addendum)
Keep up the great job!  Eat 45-60 grams of carbs per meal Keep walking Avoid snacks Continue to increase high fiber foods and whole grains in diet Lose 1-2 lb per week Get A1C to 6.5% or less Get A1C checked.

## 2019-08-17 ENCOUNTER — Encounter: Payer: Self-pay | Admitting: Nutrition

## 2019-08-18 ENCOUNTER — Other Ambulatory Visit: Payer: Self-pay | Admitting: Orthopedic Surgery

## 2019-08-18 DIAGNOSIS — G894 Chronic pain syndrome: Secondary | ICD-10-CM

## 2019-08-18 MED ORDER — HYDROCODONE-ACETAMINOPHEN 5-325 MG PO TABS: 1.0000 | ORAL_TABLET | Freq: Four times a day (QID) | ORAL | 0 refills | Status: DC | PRN

## 2019-08-18 NOTE — Telephone Encounter (Signed)
Patient requests refill on Hydrocodone/Acetaminophen 5-325  Mgs.  Qty  56  Sig: Take 1 tablet by mouth every 6 (six) hours as needed for moderate pain.  Patient states he uses [facility] on [address]

## 2019-09-07 ENCOUNTER — Other Ambulatory Visit (HOSPITAL_COMMUNITY): Payer: Self-pay | Admitting: Family Medicine

## 2019-09-07 ENCOUNTER — Telehealth: Payer: Self-pay | Admitting: Orthopedic Surgery

## 2019-09-07 ENCOUNTER — Encounter: Payer: Self-pay | Admitting: Nutrition

## 2019-09-07 ENCOUNTER — Other Ambulatory Visit: Payer: Self-pay | Admitting: Orthopedic Surgery

## 2019-09-07 ENCOUNTER — Other Ambulatory Visit: Payer: Self-pay | Admitting: Family Medicine

## 2019-09-07 DIAGNOSIS — G894 Chronic pain syndrome: Secondary | ICD-10-CM

## 2019-09-07 DIAGNOSIS — J219 Acute bronchiolitis, unspecified: Secondary | ICD-10-CM

## 2019-09-07 DIAGNOSIS — E1169 Type 2 diabetes mellitus with other specified complication: Secondary | ICD-10-CM | POA: Diagnosis not present

## 2019-09-07 DIAGNOSIS — I10 Essential (primary) hypertension: Secondary | ICD-10-CM | POA: Diagnosis not present

## 2019-09-07 DIAGNOSIS — Z720 Tobacco use: Secondary | ICD-10-CM

## 2019-09-07 MED ORDER — HYDROCODONE-ACETAMINOPHEN 5-325 MG PO TABS: 1.0000 | ORAL_TABLET | Freq: Four times a day (QID) | ORAL | 0 refills | Status: DC | PRN

## 2019-09-07 MED ORDER — IBUPROFEN 800 MG PO TABS: 800.0000 mg | ORAL_TABLET | Freq: Three times a day (TID) | ORAL | 5 refills | Status: DC | PRN

## 2019-09-07 NOTE — Telephone Encounter (Signed)
Patient requests refill on Ibuprofen 800 mgs.                                    Patient states he uses [facility] on [address]

## 2019-09-07 NOTE — Telephone Encounter (Signed)
Patient requests a refill on Ibuprofen 800 mgs.        Sig: Take 1 tablet (800 mg total) by mouth 3 (three) times daily.

## 2019-09-07 NOTE — Telephone Encounter (Signed)
Patient requests refill on Hydrocodone/Acetaminophen 5-325  Mgs.  Qty  56  Sig: Take 1 tablet by mouth every 6 (six) hours as needed for moderate pain.  Patient states he uses [facility] on [address] 

## 2019-09-21 ENCOUNTER — Other Ambulatory Visit: Payer: Self-pay | Admitting: Orthopedic Surgery

## 2019-09-21 DIAGNOSIS — G894 Chronic pain syndrome: Secondary | ICD-10-CM

## 2019-09-21 MED ORDER — HYDROCODONE-ACETAMINOPHEN 5-325 MG PO TABS: 1.0000 | ORAL_TABLET | Freq: Four times a day (QID) | ORAL | 0 refills | Status: DC | PRN

## 2019-09-21 NOTE — Telephone Encounter (Signed)
Patient called for refill: HYDROcodone-acetaminophen (NORCO/VICODIN) 5-325 MG tablet 56 tablet  [facility], [address], [place]  

## 2019-09-22 ENCOUNTER — Ambulatory Visit (HOSPITAL_COMMUNITY)
Admission: RE | Admit: 2019-09-22 | Discharge: 2019-09-22 | Disposition: A | Discharge status: Home / Self Care | Payer: Medicare Other | Source: Ambulatory Visit | Attending: Family Medicine | Admitting: Family Medicine

## 2019-09-22 ENCOUNTER — Other Ambulatory Visit: Payer: Self-pay

## 2019-09-22 DIAGNOSIS — Z720 Tobacco use: Secondary | ICD-10-CM | POA: Diagnosis not present

## 2019-09-22 DIAGNOSIS — J439 Emphysema, unspecified: Secondary | ICD-10-CM | POA: Insufficient documentation

## 2019-09-22 DIAGNOSIS — I700 Atherosclerosis of aorta: Secondary | ICD-10-CM | POA: Diagnosis not present

## 2019-09-22 DIAGNOSIS — Z87891 Personal history of nicotine dependence: Secondary | ICD-10-CM | POA: Insufficient documentation

## 2019-09-22 DIAGNOSIS — Z122 Encounter for screening for malignant neoplasm of respiratory organs: Secondary | ICD-10-CM | POA: Diagnosis not present

## 2019-09-22 DIAGNOSIS — J219 Acute bronchiolitis, unspecified: Secondary | ICD-10-CM | POA: Diagnosis not present

## 2019-10-05 DIAGNOSIS — H40023 Open angle with borderline findings, high risk, bilateral: Secondary | ICD-10-CM | POA: Diagnosis not present

## 2019-10-05 DIAGNOSIS — H35033 Hypertensive retinopathy, bilateral: Secondary | ICD-10-CM | POA: Diagnosis not present

## 2019-10-05 DIAGNOSIS — H25813 Combined forms of age-related cataract, bilateral: Secondary | ICD-10-CM | POA: Diagnosis not present

## 2019-10-05 DIAGNOSIS — H16143 Punctate keratitis, bilateral: Secondary | ICD-10-CM | POA: Diagnosis not present

## 2019-10-05 DIAGNOSIS — E119 Type 2 diabetes mellitus without complications: Secondary | ICD-10-CM | POA: Diagnosis not present

## 2019-10-05 DIAGNOSIS — H5213 Myopia, bilateral: Secondary | ICD-10-CM | POA: Diagnosis not present

## 2019-10-26 ENCOUNTER — Other Ambulatory Visit: Payer: Self-pay | Admitting: Orthopedic Surgery

## 2019-10-26 DIAGNOSIS — H401131 Primary open-angle glaucoma, bilateral, mild stage: Secondary | ICD-10-CM | POA: Diagnosis not present

## 2019-10-26 DIAGNOSIS — H16143 Punctate keratitis, bilateral: Secondary | ICD-10-CM | POA: Diagnosis not present

## 2019-10-26 DIAGNOSIS — H25813 Combined forms of age-related cataract, bilateral: Secondary | ICD-10-CM | POA: Diagnosis not present

## 2019-10-26 DIAGNOSIS — G894 Chronic pain syndrome: Secondary | ICD-10-CM

## 2019-10-26 NOTE — Telephone Encounter (Signed)
Patient requests refill on Hydrocodone/Acetaminophen 5-325  Mgs.   Qty  56  Sig: Take 1 tablet by mouth every 6 (six) hours as needed for moderate pain.       Patient states he uses [facility] on [address]

## 2019-10-27 MED ORDER — HYDROCODONE-ACETAMINOPHEN 5-325 MG PO TABS: 1.0000 | ORAL_TABLET | Freq: Four times a day (QID) | ORAL | 0 refills | Status: DC | PRN

## 2019-10-29 ENCOUNTER — Ambulatory Visit: Payer: Medicare Other | Attending: Internal Medicine

## 2019-10-29 DIAGNOSIS — Z23 Encounter for immunization: Secondary | ICD-10-CM

## 2019-10-29 NOTE — Progress Notes (Signed)
   Covid-19 Vaccination Clinic  Name:  KACHE SEBESTYEN    MRN: [id number] DOB: 1965/09/15  10/29/2019  Mr. Bryner was observed post Covid-19 immunization for 15 minutes without incident. He was provided with Vaccine Information Sheet and instruction to access the V-Safe system.   Mr. Furnish was instructed to call 911 with any severe reactions post vaccine: Marland Kitchen Difficulty breathing  . Swelling of face and throat  . A fast heartbeat  . A bad rash all over body  . Dizziness and weakness   Immunizations Administered    Name Date Dose VIS Date Route   Moderna COVID-19 Vaccine 10/29/2019  8:18 AM 0.5 mL 06/2019 Intramuscular   Manufacturer: Moderna   Lot: [id number]   [id number]

## 2019-10-30 DIAGNOSIS — M1712 Unilateral primary osteoarthritis, left knee: Secondary | ICD-10-CM | POA: Diagnosis not present

## 2019-10-30 DIAGNOSIS — E1165 Type 2 diabetes mellitus with hyperglycemia: Secondary | ICD-10-CM | POA: Diagnosis not present

## 2019-10-30 DIAGNOSIS — M130 Polyarthritis, unspecified: Secondary | ICD-10-CM | POA: Diagnosis not present

## 2019-10-30 DIAGNOSIS — I270 Primary pulmonary hypertension: Secondary | ICD-10-CM | POA: Diagnosis not present

## 2019-11-03 DIAGNOSIS — E1165 Type 2 diabetes mellitus with hyperglycemia: Secondary | ICD-10-CM | POA: Diagnosis not present

## 2019-11-03 DIAGNOSIS — I10 Essential (primary) hypertension: Secondary | ICD-10-CM | POA: Diagnosis not present

## 2019-11-04 DIAGNOSIS — L110 Acquired keratosis follicularis: Secondary | ICD-10-CM | POA: Diagnosis not present

## 2019-11-04 DIAGNOSIS — M79671 Pain in right foot: Secondary | ICD-10-CM | POA: Diagnosis not present

## 2019-11-04 DIAGNOSIS — E1140 Type 2 diabetes mellitus with diabetic neuropathy, unspecified: Secondary | ICD-10-CM | POA: Diagnosis not present

## 2019-11-04 DIAGNOSIS — M79674 Pain in right toe(s): Secondary | ICD-10-CM | POA: Diagnosis not present

## 2019-11-04 DIAGNOSIS — M79672 Pain in left foot: Secondary | ICD-10-CM | POA: Diagnosis not present

## 2019-11-09 ENCOUNTER — Other Ambulatory Visit: Payer: Self-pay

## 2019-11-09 DIAGNOSIS — G894 Chronic pain syndrome: Secondary | ICD-10-CM

## 2019-11-09 NOTE — Telephone Encounter (Signed)
Hydrocodone-Acetaminophen 5/325mg   Qty 56 Tablets  Take 1 tablet by mouth every 6 (six) hours as needed for moderate pain    Patient is also asking if he could get a prescription for Tylenol 600 mg   PATIENT [place]

## 2019-11-10 MED ORDER — HYDROCODONE-ACETAMINOPHEN 5-325 MG PO TABS: 1.0000 | ORAL_TABLET | Freq: Four times a day (QID) | ORAL | 0 refills | Status: DC | PRN

## 2019-11-19 ENCOUNTER — Other Ambulatory Visit: Payer: Self-pay

## 2019-11-19 ENCOUNTER — Ambulatory Visit (INDEPENDENT_AMBULATORY_CARE_PROVIDER_SITE_OTHER): Payer: Medicare Other | Admitting: Orthopedic Surgery

## 2019-11-19 VITALS — BP 122/78 | HR 79 | Ht 68.0 in | Wt 250.0 lb

## 2019-11-19 DIAGNOSIS — M1711 Unilateral primary osteoarthritis, right knee: Secondary | ICD-10-CM

## 2019-11-19 MED ORDER — IBUPROFEN 800 MG PO TABS: 800.0000 mg | ORAL_TABLET | Freq: Three times a day (TID) | ORAL | 5 refills | Status: DC | PRN

## 2019-11-19 NOTE — Progress Notes (Signed)
Chief Complaint  Patient presents with  . Knee Pain    right/ wants injection     Procedure note right knee injection   verbal consent was obtained to inject right knee joint  Timeout was completed to confirm the site of injection  The medications used were 40 mg of Depo-Medrol and 1% lidocaine 3 cc  Anesthesia was provided by ethyl chloride and the skin was prepped with alcohol.  After cleaning the skin with alcohol a 20-gauge needle was used to inject the right knee joint. There were no complications. A sterile bandage was applied.   Encounter Diagnosis  Name Primary?  . Primary osteoarthritis of right knee Yes   Meds ordered this encounter  Medications  . ibuprofen (ADVIL) 800 MG tablet    Sig: Take 1 tablet (800 mg total) by mouth every 8 (eight) hours as needed.    Dispense:  90 tablet    Refill:  5

## 2019-11-26 ENCOUNTER — Other Ambulatory Visit: Payer: Self-pay

## 2019-11-26 ENCOUNTER — Ambulatory Visit: Payer: Medicare Other | Attending: Internal Medicine

## 2019-11-26 DIAGNOSIS — Z23 Encounter for immunization: Secondary | ICD-10-CM

## 2019-11-26 NOTE — Progress Notes (Signed)
   Covid-19 Vaccination Clinic  Name:  Terry Case    MRN: [id number] DOB: 1966/03/11  11/26/2019  Mr. Terry Case was observed post Covid-19 immunization for 15 minutes without incident. He was provided with Vaccine Information Sheet and instruction to access the V-Safe system.   Mr. Terry Case was instructed to call 911 with any severe reactions post vaccine: Marland Kitchen Difficulty breathing  . Swelling of face and throat  . A fast heartbeat  . A bad rash all over body  . Dizziness and weakness   Immunizations Administered    Name Date Dose VIS Date Route   Moderna COVID-19 Vaccine 11/26/2019  9:26 AM 0.5 mL 06/2019 Intramuscular   Manufacturer: Moderna   Lot: [id number]   [id number]

## 2019-11-30 DIAGNOSIS — E1165 Type 2 diabetes mellitus with hyperglycemia: Secondary | ICD-10-CM | POA: Diagnosis not present

## 2019-11-30 DIAGNOSIS — I270 Primary pulmonary hypertension: Secondary | ICD-10-CM | POA: Diagnosis not present

## 2019-11-30 DIAGNOSIS — M130 Polyarthritis, unspecified: Secondary | ICD-10-CM | POA: Diagnosis not present

## 2019-11-30 DIAGNOSIS — M1712 Unilateral primary osteoarthritis, left knee: Secondary | ICD-10-CM | POA: Diagnosis not present

## 2019-12-01 ENCOUNTER — Other Ambulatory Visit: Payer: Self-pay | Admitting: Orthopedic Surgery

## 2019-12-01 DIAGNOSIS — G894 Chronic pain syndrome: Secondary | ICD-10-CM

## 2019-12-01 MED ORDER — HYDROCODONE-ACETAMINOPHEN 5-325 MG PO TABS: 1.0000 | ORAL_TABLET | Freq: Four times a day (QID) | ORAL | 0 refills | Status: DC | PRN

## 2019-12-01 NOTE — Telephone Encounter (Signed)
Patient called for refill: HYDROcodone-acetaminophen (NORCO/VICODIN) 5-325 MG tablet  [facility], [address], [place]

## 2019-12-02 DIAGNOSIS — H524 Presbyopia: Secondary | ICD-10-CM | POA: Diagnosis not present

## 2019-12-03 ENCOUNTER — Other Ambulatory Visit: Payer: Self-pay

## 2019-12-03 ENCOUNTER — Encounter: Payer: Medicare Other | Attending: Family Medicine | Admitting: Nutrition

## 2019-12-03 ENCOUNTER — Encounter: Payer: Self-pay | Admitting: Nutrition

## 2019-12-03 VITALS — Ht 68.0 in | Wt 251.0 lb

## 2019-12-03 DIAGNOSIS — E1165 Type 2 diabetes mellitus with hyperglycemia: Secondary | ICD-10-CM | POA: Diagnosis not present

## 2019-12-03 DIAGNOSIS — E118 Type 2 diabetes mellitus with unspecified complications: Secondary | ICD-10-CM | POA: Diagnosis not present

## 2019-12-03 DIAGNOSIS — E669 Obesity, unspecified: Secondary | ICD-10-CM | POA: Diagnosis present

## 2019-12-03 DIAGNOSIS — I10 Essential (primary) hypertension: Secondary | ICD-10-CM | POA: Insufficient documentation

## 2019-12-03 DIAGNOSIS — IMO0002 Reserved for concepts with insufficient information to code with codable children: Secondary | ICD-10-CM

## 2019-12-03 NOTE — Progress Notes (Signed)
  Medical Nutrition Therapy:  Appt start time: 900 and time:  930   Assessment:  Primary concerns today: Diabetes and Obesity. Dx Dm x 3 years.  Lost 13 lbs in the last three months. Fells a lot better. BP and BS are better. FBS    90's  Before bed 95-100's mg/dl Reflux is better.  Currently taking Janumet 100/1000 mg   A1C 5.8% now from 6%. Lipid profile normal.  HDL low 34 mg/dl.  Preferred Learning Style:  Auditory  Visual-limited reading ability but likes to see things to learn  Hands on  Learning Readiness:   Ready  Change in progress   MEDICATIONS:    DIETARY INTAKE:  24-hr recall:  B ( 8 AM):  Fruit, and eggs  Snk ( AM):  L ( 1 ): Toss salad- ranch and steak-8 oz, water Snk ( PM):water D ( PM): [place] cherrios, 2 cups. Whole milk,  Snk ( PM): Apple juice  Beverages: water, juice  Usual physical activity: walks some.  Estimated energy needs: 1800 calories 200g carbohydrates 135g protein 50 g fat  Progress Towards Goal(s):  In progress.   Nutritional Diagnosis:  NB-1.1 Food and nutrition-related knowledge deficit As related to DIabetes Type 2.  As evidenced by A1C > 6.5%.    Intervention:  Nutrition and Diabetes education provided on My Plate, CHO counting, meal planning, portion sizes, timing of meals, avoiding snacks between meals unless having a low blood sugar, target ranges for A1C and blood sugars, signs/symptoms and treatment of hyper/hypoglycemia, monitoring blood sugars, taking medications as prescribed, benefits of exercising 30 minutes per day and prevention of complications of DM.    Keep up the great job!  Goals  Increase exercise to 60 minutes 3-4 times per week. Increase low carb vegetables.  Teaching Method Utilized:  Visual Auditory Hands on  Handouts given during visit include:  The Plate Method   Visual plate ideas   Barriers to learning/adherence to lifestyle change: low reading and writing ability  Demonstrated  degree of understanding via:  Teach Back   Monitoring/Evaluation:  Dietary intake, exercise, , and body weight in 4 month(s).

## 2019-12-03 NOTE — Patient Instructions (Signed)
Goals  Increase exercise to 60 minutes 3-4 times per week. Increase low carb vegetables.

## 2019-12-16 ENCOUNTER — Other Ambulatory Visit: Payer: Self-pay | Admitting: Orthopedic Surgery

## 2019-12-16 DIAGNOSIS — G894 Chronic pain syndrome: Secondary | ICD-10-CM

## 2019-12-16 MED ORDER — HYDROCODONE-ACETAMINOPHEN 5-325 MG PO TABS: 1.0000 | ORAL_TABLET | Freq: Four times a day (QID) | ORAL | 0 refills | Status: DC | PRN

## 2019-12-16 NOTE — Telephone Encounter (Signed)
Patient requests refill: HYDROcodone-acetaminophen (NORCO/VICODIN) 5-325 MG tablet 56 tablet   [facility], [address], [place] 

## 2020-01-05 ENCOUNTER — Other Ambulatory Visit: Payer: Self-pay | Admitting: Orthopedic Surgery

## 2020-01-05 DIAGNOSIS — G894 Chronic pain syndrome: Secondary | ICD-10-CM

## 2020-01-05 MED ORDER — HYDROCODONE-ACETAMINOPHEN 5-325 MG PO TABS: 1.0000 | ORAL_TABLET | Freq: Four times a day (QID) | ORAL | 0 refills | Status: DC | PRN

## 2020-01-05 NOTE — Telephone Encounter (Signed)
Patient called for refill: HYDROcodone-acetaminophen (NORCO/VICODIN) 5-325 MG tablet 56 tablet  [facility], [address], [place]  

## 2020-01-11 DIAGNOSIS — R21 Rash and other nonspecific skin eruption: Secondary | ICD-10-CM | POA: Diagnosis not present

## 2020-01-11 DIAGNOSIS — R519 Headache, unspecified: Secondary | ICD-10-CM | POA: Diagnosis not present

## 2020-01-11 DIAGNOSIS — E1169 Type 2 diabetes mellitus with other specified complication: Secondary | ICD-10-CM | POA: Diagnosis not present

## 2020-01-11 DIAGNOSIS — I10 Essential (primary) hypertension: Secondary | ICD-10-CM | POA: Diagnosis not present

## 2020-01-19 ENCOUNTER — Other Ambulatory Visit: Payer: Self-pay | Admitting: Orthopedic Surgery

## 2020-01-19 DIAGNOSIS — G894 Chronic pain syndrome: Secondary | ICD-10-CM

## 2020-01-19 MED ORDER — HYDROCODONE-ACETAMINOPHEN 5-325 MG PO TABS: 1.0000 | ORAL_TABLET | Freq: Four times a day (QID) | ORAL | 0 refills | Status: DC | PRN

## 2020-01-19 NOTE — Telephone Encounter (Signed)
Patient called for refill: HYDROcodone-acetaminophen (NORCO/VICODIN) 5-325 MG tablet 56 tablet  [facility], [address], [place]  

## 2020-01-20 DIAGNOSIS — L110 Acquired keratosis follicularis: Secondary | ICD-10-CM | POA: Diagnosis not present

## 2020-01-20 DIAGNOSIS — M79672 Pain in left foot: Secondary | ICD-10-CM | POA: Diagnosis not present

## 2020-01-20 DIAGNOSIS — E1140 Type 2 diabetes mellitus with diabetic neuropathy, unspecified: Secondary | ICD-10-CM | POA: Diagnosis not present

## 2020-01-20 DIAGNOSIS — M79674 Pain in right toe(s): Secondary | ICD-10-CM | POA: Diagnosis not present

## 2020-01-20 DIAGNOSIS — M79671 Pain in right foot: Secondary | ICD-10-CM | POA: Diagnosis not present

## 2020-01-25 DIAGNOSIS — H401131 Primary open-angle glaucoma, bilateral, mild stage: Secondary | ICD-10-CM | POA: Diagnosis not present

## 2020-01-25 DIAGNOSIS — H16143 Punctate keratitis, bilateral: Secondary | ICD-10-CM | POA: Diagnosis not present

## 2020-01-25 DIAGNOSIS — H25813 Combined forms of age-related cataract, bilateral: Secondary | ICD-10-CM | POA: Diagnosis not present

## 2020-02-02 ENCOUNTER — Other Ambulatory Visit: Payer: Self-pay | Admitting: Orthopedic Surgery

## 2020-02-02 DIAGNOSIS — G894 Chronic pain syndrome: Secondary | ICD-10-CM

## 2020-02-02 MED ORDER — HYDROCODONE-ACETAMINOPHEN 5-325 MG PO TABS: 1.0000 | ORAL_TABLET | Freq: Four times a day (QID) | ORAL | 0 refills | Status: DC | PRN

## 2020-02-02 NOTE — Telephone Encounter (Signed)
Patient requests refill on Hydrocodone/Acetaminophen 5-325  Mgs.  Qty  56  Sig: Take 1 tablet by mouth every 6 (six) hours as needed for moderate pain.  Patient states he uses [facility] on [address].

## 2020-02-16 ENCOUNTER — Other Ambulatory Visit: Payer: Self-pay | Admitting: Orthopedic Surgery

## 2020-02-16 DIAGNOSIS — R21 Rash and other nonspecific skin eruption: Secondary | ICD-10-CM | POA: Diagnosis not present

## 2020-02-16 DIAGNOSIS — I10 Essential (primary) hypertension: Secondary | ICD-10-CM | POA: Diagnosis not present

## 2020-02-16 DIAGNOSIS — E1169 Type 2 diabetes mellitus with other specified complication: Secondary | ICD-10-CM | POA: Diagnosis not present

## 2020-02-16 DIAGNOSIS — R350 Frequency of micturition: Secondary | ICD-10-CM | POA: Diagnosis not present

## 2020-02-16 DIAGNOSIS — G894 Chronic pain syndrome: Secondary | ICD-10-CM

## 2020-02-16 MED ORDER — HYDROCODONE-ACETAMINOPHEN 5-325 MG PO TABS: 1.0000 | ORAL_TABLET | Freq: Four times a day (QID) | ORAL | 0 refills | Status: DC | PRN

## 2020-02-16 NOTE — Telephone Encounter (Signed)
Patient called for refill: HYDROcodone-acetaminophen (NORCO/VICODIN) 5-325 MG tablet 56 tablet  [facility] ([address])

## 2020-03-01 DIAGNOSIS — I10 Essential (primary) hypertension: Secondary | ICD-10-CM | POA: Diagnosis not present

## 2020-03-01 DIAGNOSIS — E1165 Type 2 diabetes mellitus with hyperglycemia: Secondary | ICD-10-CM | POA: Diagnosis not present

## 2020-03-01 DIAGNOSIS — K219 Gastro-esophageal reflux disease without esophagitis: Secondary | ICD-10-CM | POA: Diagnosis not present

## 2020-03-01 DIAGNOSIS — M1712 Unilateral primary osteoarthritis, left knee: Secondary | ICD-10-CM | POA: Diagnosis not present

## 2020-03-02 ENCOUNTER — Other Ambulatory Visit: Payer: Self-pay | Admitting: Orthopedic Surgery

## 2020-03-02 DIAGNOSIS — G894 Chronic pain syndrome: Secondary | ICD-10-CM

## 2020-03-02 MED ORDER — HYDROCODONE-ACETAMINOPHEN 5-325 MG PO TABS: 1.0000 | ORAL_TABLET | Freq: Four times a day (QID) | ORAL | 0 refills | Status: DC | PRN

## 2020-03-02 NOTE — Telephone Encounter (Signed)
Patient called for refill: HYDROcodone-acetaminophen (NORCO/VICODIN) 5-325 MG tablet 56 tablet  -[facility], [place], [place], [place]

## 2020-03-15 ENCOUNTER — Other Ambulatory Visit: Payer: Self-pay | Admitting: Orthopedic Surgery

## 2020-03-15 DIAGNOSIS — G894 Chronic pain syndrome: Secondary | ICD-10-CM

## 2020-03-15 MED ORDER — HYDROCODONE-ACETAMINOPHEN 5-325 MG PO TABS: 1.0000 | ORAL_TABLET | Freq: Four times a day (QID) | ORAL | 0 refills | Status: DC | PRN

## 2020-03-15 NOTE — Telephone Encounter (Signed)
Patient requests refill on Hydrocodone/Acetaminophen 5-325  Ms.  Qty  56      Sig: Take 1 tablet by mouth every 6 (six) hours as needed for moderate pain.        Patient states he uses [facility] [address].

## 2020-03-31 DIAGNOSIS — E1165 Type 2 diabetes mellitus with hyperglycemia: Secondary | ICD-10-CM | POA: Diagnosis not present

## 2020-03-31 DIAGNOSIS — I10 Essential (primary) hypertension: Secondary | ICD-10-CM | POA: Diagnosis not present

## 2020-03-31 DIAGNOSIS — K219 Gastro-esophageal reflux disease without esophagitis: Secondary | ICD-10-CM | POA: Diagnosis not present

## 2020-03-31 DIAGNOSIS — M1712 Unilateral primary osteoarthritis, left knee: Secondary | ICD-10-CM | POA: Diagnosis not present

## 2020-04-05 ENCOUNTER — Other Ambulatory Visit: Payer: Self-pay | Admitting: Orthopedic Surgery

## 2020-04-05 DIAGNOSIS — G894 Chronic pain syndrome: Secondary | ICD-10-CM

## 2020-04-05 MED ORDER — HYDROCODONE-ACETAMINOPHEN 5-325 MG PO TABS: 1.0000 | ORAL_TABLET | Freq: Four times a day (QID) | ORAL | 0 refills | Status: DC | PRN

## 2020-04-05 NOTE — Telephone Encounter (Signed)
Patient requests refill on Hydrocodone/Acetaminophen 5-325  Ms.  Qty  56      Sig: Take 1 tablet by mouth every 6 (six) hours as needed for moderate pain.        Patient states he uses [facility] [address].

## 2020-04-06 DIAGNOSIS — H35033 Hypertensive retinopathy, bilateral: Secondary | ICD-10-CM | POA: Diagnosis not present

## 2020-04-06 DIAGNOSIS — H179 Unspecified corneal scar and opacity: Secondary | ICD-10-CM | POA: Diagnosis not present

## 2020-04-06 DIAGNOSIS — H401131 Primary open-angle glaucoma, bilateral, mild stage: Secondary | ICD-10-CM | POA: Diagnosis not present

## 2020-04-06 DIAGNOSIS — H40023 Open angle with borderline findings, high risk, bilateral: Secondary | ICD-10-CM | POA: Diagnosis not present

## 2020-04-18 DIAGNOSIS — Z23 Encounter for immunization: Secondary | ICD-10-CM | POA: Diagnosis not present

## 2020-04-18 DIAGNOSIS — I10 Essential (primary) hypertension: Secondary | ICD-10-CM | POA: Diagnosis not present

## 2020-04-18 DIAGNOSIS — E1169 Type 2 diabetes mellitus with other specified complication: Secondary | ICD-10-CM | POA: Diagnosis not present

## 2020-04-20 ENCOUNTER — Other Ambulatory Visit: Payer: Self-pay | Admitting: Orthopedic Surgery

## 2020-04-20 DIAGNOSIS — G894 Chronic pain syndrome: Secondary | ICD-10-CM

## 2020-04-20 NOTE — Telephone Encounter (Signed)
Patient requests refill: HYDROcodone-acetaminophen (NORCO/VICODIN) 5-325 MG tablet 56 tablet  -[facility], [place], [place]

## 2020-04-21 MED ORDER — HYDROCODONE-ACETAMINOPHEN 5-325 MG PO TABS: 1.0000 | ORAL_TABLET | Freq: Four times a day (QID) | ORAL | 0 refills | Status: DC | PRN

## 2020-04-27 DIAGNOSIS — L110 Acquired keratosis follicularis: Secondary | ICD-10-CM | POA: Diagnosis not present

## 2020-04-27 DIAGNOSIS — E1140 Type 2 diabetes mellitus with diabetic neuropathy, unspecified: Secondary | ICD-10-CM | POA: Diagnosis not present

## 2020-04-27 DIAGNOSIS — M79671 Pain in right foot: Secondary | ICD-10-CM | POA: Diagnosis not present

## 2020-04-27 DIAGNOSIS — M79672 Pain in left foot: Secondary | ICD-10-CM | POA: Diagnosis not present

## 2020-04-27 DIAGNOSIS — M79674 Pain in right toe(s): Secondary | ICD-10-CM | POA: Diagnosis not present

## 2020-04-30 DIAGNOSIS — K219 Gastro-esophageal reflux disease without esophagitis: Secondary | ICD-10-CM | POA: Diagnosis not present

## 2020-04-30 DIAGNOSIS — I10 Essential (primary) hypertension: Secondary | ICD-10-CM | POA: Diagnosis not present

## 2020-04-30 DIAGNOSIS — M1712 Unilateral primary osteoarthritis, left knee: Secondary | ICD-10-CM | POA: Diagnosis not present

## 2020-04-30 DIAGNOSIS — E1165 Type 2 diabetes mellitus with hyperglycemia: Secondary | ICD-10-CM | POA: Diagnosis not present

## 2020-05-03 ENCOUNTER — Telehealth: Payer: Self-pay | Admitting: Orthopedic Surgery

## 2020-05-03 ENCOUNTER — Ambulatory Visit: Payer: Medicare Other | Admitting: Internal Medicine

## 2020-05-03 NOTE — Telephone Encounter (Signed)
Patient request refill   HYDROcodone-acetaminophen (NORCO/VICODIN) 5-325 MG tablet   04/21/20 -- Carole Civil, MD    Take 1 tablet by mouth every 6 (six) hours as needed for moderate pain.    Pharmacy [facility] on [address]

## 2020-05-05 DIAGNOSIS — H401131 Primary open-angle glaucoma, bilateral, mild stage: Secondary | ICD-10-CM | POA: Diagnosis not present

## 2020-05-09 ENCOUNTER — Other Ambulatory Visit: Payer: Self-pay | Admitting: Orthopedic Surgery

## 2020-05-09 DIAGNOSIS — G894 Chronic pain syndrome: Secondary | ICD-10-CM

## 2020-05-09 MED ORDER — HYDROCODONE-ACETAMINOPHEN 5-325 MG PO TABS: 1.0000 | ORAL_TABLET | Freq: Four times a day (QID) | ORAL | 0 refills | Status: DC | PRN

## 2020-05-09 NOTE — Telephone Encounter (Signed)
Please advise 

## 2020-05-09 NOTE — Telephone Encounter (Signed)
HYDROcodone-acetaminophen (NORCO/VICODIN) 5-325 MG tablet   04/21/20 -- Carole Civil, MD    Take 1 tablet by mouth every 6 (six) hours as needed for moderate pain.

## 2020-05-19 ENCOUNTER — Other Ambulatory Visit: Payer: Self-pay | Admitting: Orthopedic Surgery

## 2020-05-23 ENCOUNTER — Other Ambulatory Visit: Payer: Self-pay | Admitting: Orthopedic Surgery

## 2020-05-23 DIAGNOSIS — G894 Chronic pain syndrome: Secondary | ICD-10-CM

## 2020-05-23 NOTE — Telephone Encounter (Signed)
Patient of Dr Aline Brochure requests refill on Hydrocodone/Acetaminophen 5-325 mgs.  Qty 56  Sig: Take 1 tablet by mouth every 6 (six) hours as needed for moderate pain.  Patient states he uses [facility] on [address]

## 2020-05-24 MED ORDER — HYDROCODONE-ACETAMINOPHEN 5-325 MG PO TABS: 1.0000 | ORAL_TABLET | Freq: Four times a day (QID) | ORAL | 0 refills | Status: DC | PRN

## 2020-06-07 ENCOUNTER — Other Ambulatory Visit: Payer: Self-pay | Admitting: Orthopedic Surgery

## 2020-06-07 ENCOUNTER — Other Ambulatory Visit: Payer: Self-pay

## 2020-06-07 ENCOUNTER — Encounter: Payer: Medicare Other | Attending: Family Medicine | Admitting: Nutrition

## 2020-06-07 ENCOUNTER — Encounter: Payer: Self-pay | Admitting: Nutrition

## 2020-06-07 VITALS — Ht 68.0 in | Wt 264.0 lb

## 2020-06-07 DIAGNOSIS — E669 Obesity, unspecified: Secondary | ICD-10-CM

## 2020-06-07 DIAGNOSIS — I10 Essential (primary) hypertension: Secondary | ICD-10-CM

## 2020-06-07 DIAGNOSIS — E1165 Type 2 diabetes mellitus with hyperglycemia: Secondary | ICD-10-CM | POA: Diagnosis not present

## 2020-06-07 DIAGNOSIS — E118 Type 2 diabetes mellitus with unspecified complications: Secondary | ICD-10-CM | POA: Insufficient documentation

## 2020-06-07 DIAGNOSIS — IMO0002 Reserved for concepts with insufficient information to code with codable children: Secondary | ICD-10-CM

## 2020-06-07 DIAGNOSIS — G894 Chronic pain syndrome: Secondary | ICD-10-CM

## 2020-06-07 MED ORDER — HYDROCODONE-ACETAMINOPHEN 5-325 MG PO TABS: 1.0000 | ORAL_TABLET | Freq: Four times a day (QID) | ORAL | 0 refills | Status: DC | PRN

## 2020-06-07 NOTE — Patient Instructions (Signed)
Goals  Eat meals on time Get back to [facility] and start doing water therapy, Talk to Dr. Aline Brochure about knees Measure foods out Lose 1 lb per week.

## 2020-06-07 NOTE — Progress Notes (Signed)
°  Medical Nutrition Therapy:  Appt start time:  0930 and time: 1000  Assessment:  Primary concerns today: Diabetes and Obesity. Dx Dm x 3 years. Follow up: Gained 13 lbs. Had stopped walking due to knee issues and gained his weight back. FBS: 135 mg/dl after he ate this am FBS: 95-100's Janument 100/1000 mg/dl. Saw Dr. Berneta Sages HIll last month.  Preferred Learning Style:  Auditory  Visual-limited reading ability but likes to see things to learn  Hands on  Learning Readiness:   Ready  Change in progress   MEDICATIONS:    DIETARY INTAKE:  24-hr recall:  B ( 8 AM):  Fruit, and eggs  Snk ( AM):  L ( 1 )  Fruit and fiber Banana  Dinner baked chicken and pinto bean  Usual physical activity: walks some.  Estimated energy needs: 1800 calories 200g carbohydrates 135g protein 50 g fat  Progress Towards Goal(s):  In progress.   Nutritional Diagnosis:  NB-1.1 Food and nutrition-related knowledge deficit As related to DIabetes Type 2.  As evidenced by A1C > 6.5%.    Intervention:  Nutrition and Diabetes education provided on My Plate, CHO counting, meal planning, portion sizes, timing of meals, avoiding snacks between meals unless having a low blood sugar, target ranges for A1C and blood sugars, signs/symptoms and treatment of hyper/hypoglycemia, monitoring blood sugars, taking medications as prescribed, benefits of exercising 30 minutes per day and prevention of complications of DM.    Goals  Eat meals on time Get back to [facility] and start doing water therapy, Talk to Dr. Aline Brochure about knees Measure foods out Lose 1 lb per week.   Teaching Method Utilized:  Visual Auditory Hands on  Handouts given during visit include:  The Plate Method   Visual plate ideas   Barriers to learning/adherence to lifestyle change: low reading and writing ability  Demonstrated degree of understanding via:  Teach Back   Monitoring/Evaluation:  Dietary intake, exercise, , and body  weight in 4 month(s).

## 2020-06-07 NOTE — Telephone Encounter (Signed)
Patient called for refill: HYDROcodone-acetaminophen (NORCO/VICODIN) 5-325 MG tablet 56 tablet  -[facility], [place], [place]

## 2020-06-13 ENCOUNTER — Other Ambulatory Visit: Payer: Self-pay

## 2020-06-13 ENCOUNTER — Ambulatory Visit (INDEPENDENT_AMBULATORY_CARE_PROVIDER_SITE_OTHER): Payer: Medicare Other | Admitting: Orthopedic Surgery

## 2020-06-13 VITALS — Ht 68.0 in | Wt 250.0 lb

## 2020-06-13 DIAGNOSIS — M1711 Unilateral primary osteoarthritis, right knee: Secondary | ICD-10-CM | POA: Diagnosis not present

## 2020-06-13 NOTE — Progress Notes (Signed)
Chief Complaint  Patient presents with  . Follow-up    Recheck on right knee.   54 year old male longstanding osteoarthritis right knee requests injection right knee  Patient maintained hydrocodone for chronic knee pain he is interested in surgery however Covid conditions currently allows one admission per day and were already scheduling out into February and he is aware  Knee looks amenable for injection   Procedure note right knee injection   verbal consent was obtained to inject right knee joint  Timeout was completed to confirm the site of injection  The medications used were 40 mg of Depo-Medrol and 1% lidocaine 3 cc  Anesthesia was provided by ethyl chloride and the skin was prepped with alcohol.  After cleaning the skin with alcohol a 20-gauge needle was used to inject the right knee joint. There were no complications. A sterile bandage was applied.  Encounter Diagnosis  Name Primary?  . Primary osteoarthritis of right knee Yes

## 2020-06-13 NOTE — Patient Instructions (Signed)

## 2020-06-14 ENCOUNTER — Encounter: Payer: Self-pay | Admitting: Nutrition

## 2020-06-20 ENCOUNTER — Other Ambulatory Visit: Payer: Self-pay | Admitting: Orthopedic Surgery

## 2020-06-20 DIAGNOSIS — G894 Chronic pain syndrome: Secondary | ICD-10-CM

## 2020-06-20 MED ORDER — HYDROCODONE-ACETAMINOPHEN 5-325 MG PO TABS: 1.0000 | ORAL_TABLET | Freq: Four times a day (QID) | ORAL | 0 refills | Status: DC | PRN

## 2020-06-20 NOTE — Telephone Encounter (Signed)
Patient called for refill: HYDROcodone-acetaminophen (NORCO/VICODIN) 5-325 MG tablet 56 tablet  [facility], [address], [place]

## 2020-07-04 ENCOUNTER — Ambulatory Visit: Payer: Medicare Other

## 2020-07-04 ENCOUNTER — Encounter: Payer: Self-pay | Admitting: Orthopedic Surgery

## 2020-07-04 ENCOUNTER — Ambulatory Visit (INDEPENDENT_AMBULATORY_CARE_PROVIDER_SITE_OTHER): Payer: Medicare Other | Admitting: Orthopedic Surgery

## 2020-07-04 ENCOUNTER — Other Ambulatory Visit: Payer: Self-pay

## 2020-07-04 VITALS — BP 147/93 | HR 68 | Ht 68.0 in | Wt 250.0 lb

## 2020-07-04 DIAGNOSIS — M1712 Unilateral primary osteoarthritis, left knee: Secondary | ICD-10-CM

## 2020-07-04 DIAGNOSIS — M1711 Unilateral primary osteoarthritis, right knee: Secondary | ICD-10-CM | POA: Diagnosis not present

## 2020-07-04 NOTE — Patient Instructions (Signed)
Make appointment when needed

## 2020-07-04 NOTE — Progress Notes (Signed)
Chief Complaint  Patient presents with  . Follow-up    Yearly recheck bilateral knees OA    Encounter Diagnoses  Name Primary?  . Primary osteoarthritis of right knee Yes  . 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 Acid reflux   . Arthritis   . Asthma   . Chronic back pain   . Diabetes mellitus without complication (HCC)   . Eczema   . GERD (gastroesophageal reflux disease)   . Hypertension   . Obstructive sleep apnea   . Pneumonia    Social History   Tobacco Use  . Smoking status: Former Smoker    Packs/day: 2.00    Years: 30.00    Pack years: 60.00    Types: Cigarettes    Quit date: 09/26/2011    Years since quitting: 8.7  . Smokeless tobacco: Never Used  Vaping Use  . Vaping Use: Never used  Substance Use Topics  . Alcohol use: Not Currently    Comment: none in 8 yrs  . Drug use: Not Currently    Comment: none in 8 yrs    BP (!) 147/93   Pulse 68   Ht 5\' 8"  (1.727 m)   Wt 250 lb (113.4 kg)   BMI 38.01 kg/m   He is awake and alert he is oriented x3 his body habitus is endomorphic  Right knee: No tenderness or swelling slight flexion contracture 105 degrees flexion knee stable  Left knee: No tenderness or swelling also has a flexion contracture with only 110 degrees of knee flexion otherwise stable  AP and lateral of both knees 3 views of each knee show medial compartment arthrosis varus deformity less than 10 degrees surrounding osteophytes consistent with osteoarthritis compared to last year's x-rays no major change  Patient is interested in total knee says he has to get his  bills caught up first and then he would like to have a knee replacement  He lives with his wife so he has help at home should be a good candidate

## 2020-07-12 ENCOUNTER — Other Ambulatory Visit: Payer: Self-pay | Admitting: Orthopedic Surgery

## 2020-07-12 DIAGNOSIS — G894 Chronic pain syndrome: Secondary | ICD-10-CM

## 2020-07-12 NOTE — Telephone Encounter (Signed)
Regarding pain medication - patient called then came to office in person after contacting [place], [place] states pharmacy told him they cannot send the request electronically to the doctor:   HYDROcodone-acetaminophen (NORCO/VICODIN) 5-325 MG tablet 56 tablet  [facility], [place]

## 2020-07-13 MED ORDER — HYDROCODONE-ACETAMINOPHEN 5-325 MG PO TABS: 1.0000 | ORAL_TABLET | Freq: Four times a day (QID) | ORAL | 0 refills | Status: DC | PRN

## 2020-07-20 DIAGNOSIS — M79672 Pain in left foot: Secondary | ICD-10-CM | POA: Diagnosis not present

## 2020-07-20 DIAGNOSIS — M79671 Pain in right foot: Secondary | ICD-10-CM | POA: Diagnosis not present

## 2020-07-20 DIAGNOSIS — M79675 Pain in left toe(s): Secondary | ICD-10-CM | POA: Diagnosis not present

## 2020-07-20 DIAGNOSIS — L110 Acquired keratosis follicularis: Secondary | ICD-10-CM | POA: Diagnosis not present

## 2020-07-20 DIAGNOSIS — E1140 Type 2 diabetes mellitus with diabetic neuropathy, unspecified: Secondary | ICD-10-CM | POA: Diagnosis not present

## 2020-07-20 DIAGNOSIS — M79674 Pain in right toe(s): Secondary | ICD-10-CM | POA: Diagnosis not present

## 2020-07-29 ENCOUNTER — Other Ambulatory Visit: Payer: Self-pay | Admitting: Orthopedic Surgery

## 2020-07-29 DIAGNOSIS — G894 Chronic pain syndrome: Secondary | ICD-10-CM

## 2020-07-29 NOTE — Telephone Encounter (Signed)
Patient called and request refill on pain medicine   HYDROcodone-acetaminophen (NORCO/VICODIN) 5-325 MG tablet  Pharmacy: [place] on [address]

## 2020-07-30 DIAGNOSIS — E1165 Type 2 diabetes mellitus with hyperglycemia: Secondary | ICD-10-CM | POA: Diagnosis not present

## 2020-07-30 DIAGNOSIS — M1712 Unilateral primary osteoarthritis, left knee: Secondary | ICD-10-CM | POA: Diagnosis not present

## 2020-07-30 DIAGNOSIS — K219 Gastro-esophageal reflux disease without esophagitis: Secondary | ICD-10-CM | POA: Diagnosis not present

## 2020-08-01 MED ORDER — HYDROCODONE-ACETAMINOPHEN 5-325 MG PO TABS: 1.0000 | ORAL_TABLET | Freq: Four times a day (QID) | ORAL | 0 refills | Status: DC | PRN

## 2020-08-03 DIAGNOSIS — H179 Unspecified corneal scar and opacity: Secondary | ICD-10-CM | POA: Diagnosis not present

## 2020-08-03 DIAGNOSIS — H401131 Primary open-angle glaucoma, bilateral, mild stage: Secondary | ICD-10-CM | POA: Diagnosis not present

## 2020-08-03 DIAGNOSIS — H25813 Combined forms of age-related cataract, bilateral: Secondary | ICD-10-CM | POA: Diagnosis not present

## 2020-08-03 DIAGNOSIS — H40023 Open angle with borderline findings, high risk, bilateral: Secondary | ICD-10-CM | POA: Diagnosis not present

## 2020-08-15 ENCOUNTER — Other Ambulatory Visit: Payer: Self-pay | Admitting: Orthopedic Surgery

## 2020-08-15 DIAGNOSIS — G894 Chronic pain syndrome: Secondary | ICD-10-CM

## 2020-08-15 MED ORDER — HYDROCODONE-ACETAMINOPHEN 5-325 MG PO TABS: 1.0000 | ORAL_TABLET | Freq: Four times a day (QID) | ORAL | 0 refills | Status: DC | PRN

## 2020-08-15 NOTE — Telephone Encounter (Signed)
Patient request refill on pain medicine   HYDROcodone-acetaminophen (NORCO/VICODIN) 5-325 MG tablet    Pharmacy:  [place] on [address]

## 2020-08-15 NOTE — Addendum Note (Signed)
Addended byCandice Camp on: 08/15/2020 11:42 AM   Modules accepted: Orders

## 2020-08-22 DIAGNOSIS — S31102A Unspecified open wound of abdominal wall, epigastric region without penetration into peritoneal cavity, initial encounter: Secondary | ICD-10-CM | POA: Diagnosis not present

## 2020-08-22 DIAGNOSIS — I10 Essential (primary) hypertension: Secondary | ICD-10-CM | POA: Diagnosis not present

## 2020-08-22 DIAGNOSIS — E1169 Type 2 diabetes mellitus with other specified complication: Secondary | ICD-10-CM | POA: Diagnosis not present

## 2020-08-29 ENCOUNTER — Other Ambulatory Visit: Payer: Self-pay | Admitting: Orthopedic Surgery

## 2020-08-29 DIAGNOSIS — I10 Essential (primary) hypertension: Secondary | ICD-10-CM | POA: Diagnosis not present

## 2020-08-29 DIAGNOSIS — G894 Chronic pain syndrome: Secondary | ICD-10-CM

## 2020-08-29 DIAGNOSIS — E1165 Type 2 diabetes mellitus with hyperglycemia: Secondary | ICD-10-CM | POA: Diagnosis not present

## 2020-08-29 MED ORDER — HYDROCODONE-ACETAMINOPHEN 5-325 MG PO TABS: 1.0000 | ORAL_TABLET | Freq: Four times a day (QID) | ORAL | 0 refills | Status: DC | PRN

## 2020-08-29 NOTE — Telephone Encounter (Signed)
Patient requests refill: HYDROcodone-acetaminophen (NORCO/VICODIN) 5-325 MG tablet 56 tablet  [facility], [employer]

## 2020-09-06 ENCOUNTER — Other Ambulatory Visit: Payer: Self-pay

## 2020-09-06 ENCOUNTER — Encounter: Payer: Medicare Other | Attending: Family Medicine | Admitting: Nutrition

## 2020-09-06 ENCOUNTER — Encounter: Payer: Self-pay | Admitting: Nutrition

## 2020-09-06 VITALS — Ht 68.0 in | Wt 261.0 lb

## 2020-09-06 DIAGNOSIS — I10 Essential (primary) hypertension: Secondary | ICD-10-CM | POA: Insufficient documentation

## 2020-09-06 DIAGNOSIS — E118 Type 2 diabetes mellitus with unspecified complications: Secondary | ICD-10-CM | POA: Insufficient documentation

## 2020-09-06 DIAGNOSIS — E1165 Type 2 diabetes mellitus with hyperglycemia: Secondary | ICD-10-CM | POA: Insufficient documentation

## 2020-09-06 DIAGNOSIS — E669 Obesity, unspecified: Secondary | ICD-10-CM | POA: Diagnosis present

## 2020-09-06 DIAGNOSIS — IMO0002 Reserved for concepts with insufficient information to code with codable children: Secondary | ICD-10-CM

## 2020-09-06 NOTE — Progress Notes (Signed)
  Medical Nutrition Therapy:  Appt start time:  0930 and time: 1000  Assessment:  Primary concerns today: Diabetes and Obesity. Dx Dm x 3 years. Follow WC:HENIDP 11 lbs Hasn't been walking due to cold and rain.. Trying to get back to walking. Has been cutting out bread, FBS: 135 mg/dl after he ate this am FBS: 95-100's Janument 100/1000 mg/dl. Saw Dr. Berneta Sages HIll last month. Labs drawn. A1C down to 5.8%. Tcho 92 mg/dl, HDL 34 Low mg/dl and TG 45 and LDL 45.  BS doing much better. Feels better. Diet needs more lower carb vegetables.   Preferred Learning Style:  Auditory  Visual-limited reading ability but likes to see things to learn  Hands on  Learning Readiness:   Ready  Change in progress   MEDICATIONS:    DIETARY INTAKE:  24-hr recall:  B (   8 am AM):  Omelet and fruit, water Snk ( AM):  L 12 ) 430  ) Apple and 3 nabs, water Dinner: Hamburger with 2 slices wheat bread,  Water 16 oz peanuts. Usual physical activity: walks some.  Estimated energy needs: 1800 calories 200g carbohydrates 135g protein 50 g fat  Progress Towards Goal(s):  In progress.   Nutritional Diagnosis:  NB-1.1 Food and nutrition-related knowledge deficit As related to DIabetes Type 2.  As evidenced by A1C > 6.5%.    Intervention:  Nutrition and Diabetes education provided on My Plate, CHO counting, meal planning, portion sizes, timing of meals, avoiding snacks between meals unless having a low blood sugar, target ranges for A1C and blood sugars, signs/symptoms and treatment of hyper/hypoglycemia, monitoring blood sugars, taking medications as prescribed, benefits of exercising 30 minutes per day and prevention of complications of DM.    Goals  Eat meals on time Increase more lower carb vegetables. Follow The Plate Method Get back to [facility] and start doing water therapy-hasn't done. Talk to Dr. Aline Brochure about knees-done Measure foods out- will work on that. Lose 1 lb per wee  Teaching  Method Utilized:  Visual Auditory Hands on  Handouts given during visit include:  The Plate Method   Visual plate ideas   Barriers to learning/adherence to lifestyle change: low reading and writing ability  Demonstrated degree of understanding via:  Teach Back   Monitoring/Evaluation:  Dietary intake, exercise, , and body weight in 4 month(s).

## 2020-09-06 NOTE — Patient Instructions (Addendum)
Goals  Eat meals on time Increase more lower carb vegetables. Follow The Plate Method Get back to [facility] and start doing water therapy-hasn't done. Talk to Dr. Aline Brochure about knees-done Measure foods out- will work on that. Lose 1 lb per wee

## 2020-09-12 DIAGNOSIS — Z0000 Encounter for general adult medical examination without abnormal findings: Secondary | ICD-10-CM | POA: Diagnosis not present

## 2020-09-12 DIAGNOSIS — E1169 Type 2 diabetes mellitus with other specified complication: Secondary | ICD-10-CM | POA: Diagnosis not present

## 2020-09-12 DIAGNOSIS — S31102D Unspecified open wound of abdominal wall, epigastric region without penetration into peritoneal cavity, subsequent encounter: Secondary | ICD-10-CM | POA: Diagnosis not present

## 2020-09-14 ENCOUNTER — Other Ambulatory Visit: Payer: Self-pay | Admitting: Orthopedic Surgery

## 2020-09-14 DIAGNOSIS — G894 Chronic pain syndrome: Secondary | ICD-10-CM

## 2020-09-14 MED ORDER — HYDROCODONE-ACETAMINOPHEN 5-325 MG PO TABS: 1.0000 | ORAL_TABLET | Freq: Four times a day (QID) | ORAL | 0 refills | Status: DC | PRN

## 2020-09-14 NOTE — Telephone Encounter (Signed)
Patient called request refill for pain medicine   HYDROcodone-acetaminophen (NORCO/VICODIN) 5-325 MG tablet    Pharmacy:  [facility] [address]

## 2020-09-28 ENCOUNTER — Other Ambulatory Visit: Payer: Self-pay | Admitting: Orthopedic Surgery

## 2020-09-28 DIAGNOSIS — G894 Chronic pain syndrome: Secondary | ICD-10-CM

## 2020-09-28 NOTE — Telephone Encounter (Signed)
Patient requests refill: HYDROcodone-acetaminophen (NORCO/VICODIN) 5-325 MG tablet 56 tablet  [facility], [address], [place]

## 2020-09-29 DIAGNOSIS — I10 Essential (primary) hypertension: Secondary | ICD-10-CM | POA: Diagnosis not present

## 2020-09-29 DIAGNOSIS — M1712 Unilateral primary osteoarthritis, left knee: Secondary | ICD-10-CM | POA: Diagnosis not present

## 2020-09-29 DIAGNOSIS — E1165 Type 2 diabetes mellitus with hyperglycemia: Secondary | ICD-10-CM | POA: Diagnosis not present

## 2020-09-29 MED ORDER — HYDROCODONE-ACETAMINOPHEN 5-325 MG PO TABS: 1.0000 | ORAL_TABLET | Freq: Four times a day (QID) | ORAL | 0 refills | Status: DC | PRN

## 2020-10-17 DIAGNOSIS — H401131 Primary open-angle glaucoma, bilateral, mild stage: Secondary | ICD-10-CM | POA: Diagnosis not present

## 2020-10-17 DIAGNOSIS — E119 Type 2 diabetes mellitus without complications: Secondary | ICD-10-CM | POA: Diagnosis not present

## 2020-10-17 DIAGNOSIS — H179 Unspecified corneal scar and opacity: Secondary | ICD-10-CM | POA: Diagnosis not present

## 2020-10-17 DIAGNOSIS — H52223 Regular astigmatism, bilateral: Secondary | ICD-10-CM | POA: Diagnosis not present

## 2020-10-17 DIAGNOSIS — H35033 Hypertensive retinopathy, bilateral: Secondary | ICD-10-CM | POA: Diagnosis not present

## 2020-10-17 DIAGNOSIS — H524 Presbyopia: Secondary | ICD-10-CM | POA: Diagnosis not present

## 2020-10-17 DIAGNOSIS — H25813 Combined forms of age-related cataract, bilateral: Secondary | ICD-10-CM | POA: Diagnosis not present

## 2020-10-17 DIAGNOSIS — H5213 Myopia, bilateral: Secondary | ICD-10-CM | POA: Diagnosis not present

## 2020-10-18 ENCOUNTER — Other Ambulatory Visit: Payer: Self-pay | Admitting: Orthopedic Surgery

## 2020-10-18 DIAGNOSIS — G894 Chronic pain syndrome: Secondary | ICD-10-CM

## 2020-10-18 MED ORDER — HYDROCODONE-ACETAMINOPHEN 5-325 MG PO TABS: 1.0000 | ORAL_TABLET | Freq: Four times a day (QID) | ORAL | 0 refills | Status: DC | PRN

## 2020-10-18 NOTE — Telephone Encounter (Signed)
Patient requests refill: HYDROcodone-acetaminophen (NORCO/VICODIN) 5-325 MG tablet 56 tablet  [facility], [address], [place]

## 2020-10-26 DIAGNOSIS — M79675 Pain in left toe(s): Secondary | ICD-10-CM | POA: Diagnosis not present

## 2020-10-26 DIAGNOSIS — M79674 Pain in right toe(s): Secondary | ICD-10-CM | POA: Diagnosis not present

## 2020-10-26 DIAGNOSIS — E1140 Type 2 diabetes mellitus with diabetic neuropathy, unspecified: Secondary | ICD-10-CM | POA: Diagnosis not present

## 2020-10-26 DIAGNOSIS — M79672 Pain in left foot: Secondary | ICD-10-CM | POA: Diagnosis not present

## 2020-10-26 DIAGNOSIS — L110 Acquired keratosis follicularis: Secondary | ICD-10-CM | POA: Diagnosis not present

## 2020-10-26 DIAGNOSIS — M79671 Pain in right foot: Secondary | ICD-10-CM | POA: Diagnosis not present

## 2020-10-31 ENCOUNTER — Other Ambulatory Visit: Payer: Self-pay | Admitting: Orthopedic Surgery

## 2020-10-31 DIAGNOSIS — G894 Chronic pain syndrome: Secondary | ICD-10-CM

## 2020-10-31 MED ORDER — HYDROCODONE-ACETAMINOPHEN 5-325 MG PO TABS: 1.0000 | ORAL_TABLET | Freq: Four times a day (QID) | ORAL | 0 refills | Status: DC | PRN

## 2020-10-31 NOTE — Telephone Encounter (Signed)
    Patient requests refill: HYDROcodone-acetaminophen (NORCO/VICODIN) 5-325 MG tablet 56 tablet  [facility], [address], [place]

## 2020-11-08 ENCOUNTER — Other Ambulatory Visit: Payer: Self-pay | Admitting: Orthopedic Surgery

## 2020-11-14 ENCOUNTER — Other Ambulatory Visit: Payer: Self-pay | Admitting: Orthopedic Surgery

## 2020-11-14 DIAGNOSIS — G894 Chronic pain syndrome: Secondary | ICD-10-CM

## 2020-11-14 MED ORDER — HYDROCODONE-ACETAMINOPHEN 5-325 MG PO TABS: 1.0000 | ORAL_TABLET | Freq: Four times a day (QID) | ORAL | 0 refills | Status: DC | PRN

## 2020-11-14 NOTE — Telephone Encounter (Signed)
Patient requests refill: HYDROcodone-acetaminophen (NORCO/VICODIN) 5-325 MG tablet 56 tablet  [facility], [address] [place]

## 2020-11-24 ENCOUNTER — Other Ambulatory Visit: Payer: Self-pay

## 2020-11-24 ENCOUNTER — Ambulatory Visit (INDEPENDENT_AMBULATORY_CARE_PROVIDER_SITE_OTHER): Payer: Medicare Other | Admitting: Orthopedic Surgery

## 2020-11-24 ENCOUNTER — Encounter: Payer: Self-pay | Admitting: Orthopedic Surgery

## 2020-11-24 DIAGNOSIS — M1711 Unilateral primary osteoarthritis, right knee: Secondary | ICD-10-CM

## 2020-11-24 DIAGNOSIS — G8929 Other chronic pain: Secondary | ICD-10-CM

## 2020-11-24 DIAGNOSIS — M25511 Pain in right shoulder: Secondary | ICD-10-CM

## 2020-11-24 DIAGNOSIS — G894 Chronic pain syndrome: Secondary | ICD-10-CM

## 2020-11-24 NOTE — Progress Notes (Signed)
Chief Complaint  Patient presents with  . Injections    Right knee     Encounter Diagnoses  Name Primary?  . Primary osteoarthritis of right knee Yes  . Chronic pain syndrome   . Chronic right shoulder pain    Terry Case came in initially to have his right knee injected but he also complains of 1 year history of right shoulder pain worse in the last 3 to 4 months including pain at night.  He has some pain when he lift his arm over his head as well  Exam of his right shoulder shows tenderness in the posterior subacromial space he has good strength but painful resistance normal empty can sign positive impingement sign  Skin is normal neurovascular exam is intact  Inject right shoulder   Procedure note the subacromial injection shoulder RIGHT    Verbal consent was obtained to inject the  RIGHT   Shoulder  Timeout was completed to confirm the injection site is a subacromial space of the  RIGHT  shoulder   Medication used Depo-Medrol 40 mg and lidocaine 1% 3 cc  Anesthesia was provided by ethyl chloride  The injection was performed in the RIGHT  posterior subacromial space. After pinning the skin with alcohol and anesthetized the skin with ethyl chloride the subacromial space was injected using a 20-gauge needle. There were no complications  Sterile dressing was applied.   Procedure note right knee injection   verbal consent was obtained to inject right knee joint  Timeout was completed to confirm the site of injection  The medications used were 6 mg Celestone with Sensorcaine Anesthesia was provided by ethyl chloride and the skin was prepped with alcohol.  After cleaning the skin with alcohol a 20-gauge needle was used to inject the right knee joint. There were no complications. A sterile bandage was applied.  Recommended follow-up in a month he is interested in having his right total knee done and now that we have injected the knee he will need at least 30 days before  total knee can be done on the right  We will also readdress his right shoulder if he still having pain then we will do some x-rays there.

## 2020-11-29 ENCOUNTER — Inpatient Hospital Stay
Admit: 2020-11-29 | Discharge: 2020-11-29 | Discharge status: Against Medical Advice | Payer: BLUE CROSS/BLUE SHIELD | Attending: Emergency Medicine

## 2020-11-29 ENCOUNTER — Emergency Department: Admit: 2020-11-30 | Payer: BLUE CROSS/BLUE SHIELD | Primary: Internal Medicine

## 2020-11-29 DIAGNOSIS — L03113 Cellulitis of right upper limb: Secondary | ICD-10-CM

## 2020-11-29 MED ORDER — CLINDAMYCIN HCL 300 MG PO CAPS
300 MG | ORAL_CAPSULE | Freq: Three times a day (TID) | ORAL | 0 refills | Status: DC
Start: 2020-11-29 — End: 2020-12-02

## 2020-11-29 NOTE — ED Provider Notes (Signed)
eMERGENCY dEPARTMENT eNCOUnter        CHIEF COMPLAINT    Chief Complaint   Patient presents with   ??? Arm Pain     right arm pain from getting a piece of wire off of a grinder two weeks ago       HPI    Mark Buchanan is a 55 y.o. male who presents to ED from home.  By car.  With complaint of right arm pain and swelling.  Onset for the past couple days.  Patient states that he got poked with a piece of wire off a grinder 2 weeks ago.  Since then patient has been having drainage and swelling of the right forearm.  Intensity of symptoms moderate pain and swelling of the right forearm.  Location of symptoms right forearm.  Patient states that for the past several days the form has been getting more swollen and painful.      REVIEW OF SYSTEMS    All systems reviewed and positives are in the HPI      PAST MEDICAL HISTORY    Past Medical History:   Diagnosis Date   ??? Hyperlipidemia        SURGICAL HISTORY    Past Surgical History:   Procedure Laterality Date   ??? CORONARY ANGIOPLASTY WITH STENT PLACEMENT     ??? EXPLORATION OF WOUND OF EXTREMITY Left 02/14/2016    WOUND EXPLORATION AND REVISION performed by Romie Levee, MD at [facility] OR       CURRENT MEDICATIONS    Current Outpatient Rx   Medication Sig Dispense Refill   ??? clindamycin (CLEOCIN) 300 MG capsule Take 1 capsule by mouth 3 times daily for 10 days 30 capsule 0   ??? aspirin 81 MG tablet Take 81 mg by mouth daily     ??? atorvastatin (LIPITOR) 20 MG tablet Take 20 mg by mouth daily     ??? albuterol (PROVENTIL HFA) 108 (90 BASE) MCG/ACT inhaler Inhale 2 puffs into the lungs every 6 hours as needed for Wheezing 1 Inhaler 3       ALLERGIES    No Known Allergies    FAMILY HISTORY    History reviewed. No pertinent family history.    SOCIAL HISTORY    Social History     Socioeconomic History   ??? Marital status: Married     Spouse name: None   ??? Number of children: None   ??? Years of education: None   ??? Highest education level: None   Occupational History   ??? None   Tobacco  Use   ??? Smoking status: Former Smoker     Packs/day: 1.00     Types: Cigarettes   ??? Smokeless tobacco: Never Used   Substance and Sexual Activity   ??? Alcohol use: No   ??? Drug use: No   ??? Sexual activity: None   Other Topics Concern   ??? None   Social History Narrative   ??? None     Social Determinants of Health     Financial Resource Strain:    ??? Difficulty of Paying Living Expenses: Not on file   Food Insecurity:    ??? Worried About Programme researcher, broadcasting/film/video in the Last Year: Not on file   ??? Ran Out of Food in the Last Year: Not on file   Transportation Needs:    ??? Lack of Transportation (Medical): Not on file   ??? Lack of Transportation (Non-Medical): Not on  file   Physical Activity:    ??? Days of Exercise per Week: Not on file   ??? Minutes of Exercise per Session: Not on file   Stress:    ??? Feeling of Stress : Not on file   Social Connections:    ??? Frequency of Communication with Friends and Family: Not on file   ??? Frequency of Social Gatherings with Friends and Family: Not on file   ??? Attends Religious Services: Not on file   ??? Active Member of Clubs or Organizations: Not on file   ??? Attends Banker Meetings: Not on file   ??? Marital Status: Not on file   Intimate Partner Violence:    ??? Fear of Current or Ex-Partner: Not on file   ??? Emotionally Abused: Not on file   ??? Physically Abused: Not on file   ??? Sexually Abused: Not on file   Housing Stability:    ??? Unable to Pay for Housing in the Last Year: Not on file   ??? Number of Places Lived in the Last Year: Not on file   ??? Unstable Housing in the Last Year: Not on file       PHYSICAL EXAM    VITAL SIGNS: BP (!) 149/88    Temp 98.5 ??F (36.9 ??C) (Oral)    Resp 20    Ht 5\' 7"  (1.702 m)    Wt 135 lb (61.2 kg)    SpO2 98%    BMI 21.14 kg/m??   Constitutional:  Well developed, well nourished, no acute distress, non-toxic appearance   HENT:  Atraumatic, external ears normal, nose normal, oropharynx moist.  Neck- normal range of motion, no tenderness, supple   Respiratory:   No respiratory distress, normal breath sounds.   Cardiovascular:  Normal rate, normal rhythm, no murmurs, no gallops, no rubs   GI:  Soft, nondistended, normal bowel sounds, nontender   Musculoskeletal: Forearm with cellulitis patient  has no pain with flexion and extension of the right hand  integument: Forearm cellulitis warm to touch redness of the skin with volar aspect scabbed   Neurologic: No peripheral sensorimotor deficit.  Patient has normal flexion extension of the right hand.    RADIOLOGY/PROCEDURES    No orders to display         Labs  Labs Reviewed - No data to display          Summation      Patient Course: Patient is advised that she needs hospitalization and Ortho consult for incision and drainage.  Patient is recommended hospitalization.  Patient states that he wants to go home and possibly return later.  Will be sent home AGAINST MEDICAL ADVICE.  Patient is prescribed clindamycin.    ED Medications administered this visit:  Medications - No data to display    New Prescriptions from this visit:    New Prescriptions    CLINDAMYCIN (CLEOCIN) 300 MG CAPSULE    Take 1 capsule by mouth 3 times daily for 10 days       Follow-up:  [facility] ED  [address] [place]  [phone]            Final Impression:   1. Right arm cellulitis               (Please note that portions of this note were completed with a voice recognition program.  Efforts were made to edit the dictations but occasionally words are  mis-transcribed.)          Deanna Artis, MD  11/29/20 1710

## 2020-11-29 NOTE — ED Notes (Signed)
Patient in radiology completing xray of right arm.     Paulene Floor, RN  11/29/20 [phone]

## 2020-11-29 NOTE — ED Provider Notes (Signed)
eMERGENCY dEPARTMENT eNCOUnter        CHIEF COMPLAINT    Chief Complaint   Patient presents with   ??? Arm Injury     A piece of wire went through patients left arm two weeks ago Patient came in and was seen but he left AMA before any treatment. Tonight is swollen having drainage.        HPI    Mark Buchanan is a 55 y.o. male who presents to ED from home.  By car  With complaint of right arm pain and swelling.  Onset x2 weeks.  Patient states that the piece of wire was impaled to his right forearm 2 weeks ago  Intensity of symptoms moderate pain and swelling in the right forearm.  Patient has cellulitis.  She was seen earlier yesterday and was recommended to hospitalization for cellulitis.  Patient left stating that he had to drive somebody home.  Location of symptoms right forearm.      REVIEW OF SYSTEMS    All systems reviewed and positives are in the HPI      PAST MEDICAL HISTORY    Past Medical History:   Diagnosis Date   ??? Hyperlipidemia        SURGICAL HISTORY    Past Surgical History:   Procedure Laterality Date   ??? CORONARY ANGIOPLASTY WITH STENT PLACEMENT     ??? EXPLORATION OF WOUND OF EXTREMITY Left 02/14/2016    WOUND EXPLORATION AND REVISION performed by Romie Levee, MD at [facility] OR       CURRENT MEDICATIONS    Current Outpatient Rx   Medication Sig Dispense Refill   ??? clindamycin (CLEOCIN) 300 MG capsule Take 1 capsule by mouth 3 times daily for 10 days 30 capsule 0   ??? aspirin 81 MG tablet Take 81 mg by mouth daily     ??? atorvastatin (LIPITOR) 20 MG tablet Take 20 mg by mouth daily     ??? albuterol (PROVENTIL HFA) 108 (90 BASE) MCG/ACT inhaler Inhale 2 puffs into the lungs every 6 hours as needed for Wheezing 1 Inhaler 3       ALLERGIES    No Known Allergies    FAMILY HISTORY    History reviewed. No pertinent family history.    SOCIAL HISTORY    Social History     Socioeconomic History   ??? Marital status: Married     Spouse name: None   ??? Number of children: None   ??? Years of education: None   ???  Highest education level: None   Occupational History   ??? None   Tobacco Use   ??? Smoking status: Former Smoker     Packs/day: 1.00     Types: Cigarettes   ??? Smokeless tobacco: Never Used   Substance and Sexual Activity   ??? Alcohol use: No   ??? Drug use: No   ??? Sexual activity: None   Other Topics Concern   ??? None   Social History Narrative   ??? None     Social Determinants of Health     Financial Resource Strain:    ??? Difficulty of Paying Living Expenses: Not on file   Food Insecurity:    ??? Worried About Programme researcher, broadcasting/film/video in the Last Year: Not on file   ??? Ran Out of Food in the Last Year: Not on file   Transportation Needs:    ??? Lack of Transportation (Medical): Not on file   ???  Lack of Transportation (Non-Medical): Not on file   Physical Activity:    ??? Days of Exercise per Week: Not on file   ??? Minutes of Exercise per Session: Not on file   Stress:    ??? Feeling of Stress : Not on file   Social Connections:    ??? Frequency of Communication with Friends and Family: Not on file   ??? Frequency of Social Gatherings with Friends and Family: Not on file   ??? Attends Religious Services: Not on file   ??? Active Member of Clubs or Organizations: Not on file   ??? Attends Banker Meetings: Not on file   ??? Marital Status: Not on file   Intimate Partner Violence:    ??? Fear of Current or Ex-Partner: Not on file   ??? Emotionally Abused: Not on file   ??? Physically Abused: Not on file   ??? Sexually Abused: Not on file   Housing Stability:    ??? Unable to Pay for Housing in the Last Year: Not on file   ??? Number of Places Lived in the Last Year: Not on file   ??? Unstable Housing in the Last Year: Not on file       PHYSICAL EXAM    VITAL SIGNS: BP 135/81    Pulse (!) 109    Temp 99.3 ??F (37.4 ??C) (Oral)    Resp 20    Wt 139 lb 11.2 oz (63.4 kg)    SpO2 98%    BMI 21.88 kg/m??   Constitutional:  Well developed, well nourished, no acute distress, non-toxic appearance   HENT:  Atraumatic, external ears normal, nose normal, oropharynx  moist.  Neck- normal range of motion, no tenderness, supple   Respiratory:  No respiratory distress, normal breath sounds.   Cardiovascular:  Normal rate, normal rhythm, no murmurs, no gallops, no rubs   GI:  Soft, nondistended, normal bowel sounds, nontender   Musculoskeletal: Right forearm with swelling warm to touch no fluctuance.  Patient has small lesion in the middle of the forearm, scabbed over  Integument:  Well hydrated, no rash   Neurologic: Patient has full range of motion no focal deficits.  Patient denies pain with flexion and extension of the right hand    RADIOLOGY/PROCEDURES    XR RADIUS ULNA RIGHT (2 VIEWS)   Final Result      Soft tissue swelling without radiopaque foreign body or bony erosive changes.                     Labs  Labs Reviewed   CBC WITH AUTO DIFFERENTIAL - Abnormal; Notable for the following components:       Result Value    WBC 14.1 (*)     Segs Absolute 10.50 (*)     Absolute Mono # 1.20 (*)     All other components within normal limits   BASIC METABOLIC PANEL - Abnormal; Notable for the following components:    Glucose 103 (*)     CREATININE 1.26 (*)     Sodium 133 (*)     Chloride 94 (*)     GFR Non-African American 59 (*)     All other components within normal limits   URINE DRUG SCREEN   URINALYSIS             Summation      Patient Course: Patient is given IV fluids in ED.  Patient given vancomycin and Zosyn.  She  has discussed with Dr.Ghazarian.  Patient will be admitted for further evaluation and treatment.  ED Medications administered this visit:    Medications   piperacillin-tazobactam (ZOSYN) 3,375 mg in dextrose 5 % 50 mL IVPB (mini-bag) (0 mg IntraVENous Stopped 11/30/20 0021)   ketorolac (TORADOL) injection 30 mg (30 mg IntraVENous Given 11/30/20 0010)   vancomycin 1000 mg IVPB in 250 mL D5W addavial (0 mg IntraVENous Stopped 11/30/20 0255)   fentaNYL (SUBLIMAZE) injection 100 mcg (100 mcg IntraVENous Given 11/30/20 0233)       New Prescriptions from this visit:    New  Prescriptions    No medications on file       Follow-up:  No follow-up provider specified.      Final Impression:   1. Right forearm cellulitis               (Please note that portions of this note were completed with a voice recognition program.  Efforts were made to edit the dictations but occasionally words are mis-transcribed.)          Deanna Artis, MD  11/30/20 [phone]

## 2020-11-29 NOTE — ED Triage Notes (Signed)
Medication list verified with verbal recall from patient.

## 2020-11-29 NOTE — ED Notes (Signed)
Pt signs AMA paper work d/t patient is unable to stay in hospital at this time d/t he has to give a blind guy a ride back to [place]. Pt states "I will be back I promise"     Apolinar Junes, RN  11/29/20 1742

## 2020-11-30 ENCOUNTER — Inpatient Hospital Stay
Admission: EM | Admit: 2020-11-30 | Discharge: 2020-12-02 | Disposition: A | Discharge status: Home / Self Care | Payer: BLUE CROSS/BLUE SHIELD | Admitting: Internal Medicine

## 2020-11-30 ENCOUNTER — Other Ambulatory Visit: Payer: Self-pay | Admitting: Orthopedic Surgery

## 2020-11-30 DIAGNOSIS — G894 Chronic pain syndrome: Secondary | ICD-10-CM

## 2020-11-30 LAB — CBC WITH AUTO DIFFERENTIAL
Absolute Eos #: 0.1 10*3/uL (ref 0.0–0.4)
Absolute Lymph #: 2.2 10*3/uL (ref 1.0–4.8)
Absolute Mono #: 1.2 10*3/uL — ABNORMAL HIGH (ref 0.0–1.0)
Basophils Absolute: 0.1 10*3/uL (ref 0.0–0.2)
Basophils: 1 % (ref 0–2)
Eosinophils %: 1 % (ref 0–5)
Hematocrit: 41.5 % (ref 41–53)
Hemoglobin: 13.8 g/dL (ref 13.5–17.5)
Lymphocytes: 16 % (ref 13–44)
MCH: 29.8 pg (ref 26–34)
MCHC: 33.2 g/dL (ref 31–37)
MCV: 89.7 fL (ref 80–100)
Monocytes: 9 % (ref 5–9)
Platelets: 294 10*3/uL (ref 140–450)
RBC: 4.62 m/uL (ref 4.5–5.9)
RDW: 14 % (ref 12.1–15.2)
Seg Neutrophils: 73 % (ref 39–75)
Segs Absolute: 10.5 10*3/uL — ABNORMAL HIGH (ref 2.1–6.5)
WBC: 14.1 10*3/uL — ABNORMAL HIGH (ref 3.5–11.0)

## 2020-11-30 LAB — URINE DRUG SCREEN
Amphetamine Screen, Ur: NEGATIVE
Barbiturate Screen, Ur: NEGATIVE
Benzodiazepine Screen, Urine: NEGATIVE
Cannabinoid Scrn, Ur: NEGATIVE
Cocaine Metabolite, Urine: NEGATIVE
Methadone Screen, Urine: NEGATIVE
Methamphetamine, Urine: NEGATIVE
Opiates, Urine: NEGATIVE
Oxycodone Screen, Ur: NEGATIVE
Phencyclidine, Urine: NEGATIVE
Propoxyphene, Urine: NEGATIVE
Tricyclic Antidepressants, Urine: NEGATIVE

## 2020-11-30 LAB — BASIC METABOLIC PANEL
Anion Gap: 13 mmol/L (ref 9–17)
BUN: 13 mg/dL (ref 6–20)
Bun/Cre Ratio: 10 (ref 9–20)
CO2: 26 mmol/L (ref 20–31)
Calcium: 9.8 mg/dL (ref 8.6–10.4)
Chloride: 94 mmol/L — ABNORMAL LOW (ref 98–107)
Creatinine: 1.26 mg/dL — ABNORMAL HIGH (ref 0.70–1.20)
GFR African American: >60 mL/min (ref >60)
GFR Non-African American: 59 mL/min — ABNORMAL LOW (ref >60)
Glucose: 103 mg/dL — ABNORMAL HIGH (ref 70–99)
Potassium: 4.3 mmol/L (ref 3.7–5.3)
Sodium: 133 mmol/L — ABNORMAL LOW (ref 135–144)

## 2020-11-30 LAB — URINALYSIS
Bilirubin Urine: NEGATIVE
Glucose, Ur: NEGATIVE
Ketones, Urine: NEGATIVE
Leukocyte Esterase, Urine: NEGATIVE
Nitrite, Urine: NEGATIVE
Protein, UA: NEGATIVE
Specific Gravity, UA: 1.01 (ref 1.005–1.030)
Urine Hgb: NEGATIVE
Urobilinogen, Urine: NORMAL
pH, UA: 7 (ref 5.0–8.0)

## 2020-11-30 LAB — COVID-19, RAPID: SARS-CoV-2, Rapid: NOT DETECTED

## 2020-11-30 MED ORDER — GLYCOPYRROLATE 1 MG/5ML IJ SOLN
1 | INTRAMUSCULAR | Status: AC
Start: 2020-11-30 — End: 2020-11-30

## 2020-11-30 MED ORDER — DEXTROSE 5 % IV SOLN (MINI-BAG)
5 % | Freq: Once | INTRAVENOUS | Status: AC
Start: 2020-11-30 — End: 2020-11-30
  Administered 2020-11-30: 04:00:00 via INTRAVENOUS

## 2020-11-30 MED ORDER — VANCOMYCIN HCL 1 G IV SOLR
1 g | INTRAVENOUS | Status: DC
Start: 2020-11-30 — End: 2020-12-02
  Administered 2020-11-30 – 2020-12-02 (×3): 1250 mg via INTRAVENOUS

## 2020-11-30 MED ORDER — MIDAZOLAM HCL 2 MG/2ML IJ SOLN
2 | INTRAMUSCULAR | Status: AC
Start: 2020-11-30 — End: 2020-11-30

## 2020-11-30 MED ORDER — NORMAL SALINE FLUSH 0.9 % IV SOLN
0.9 % | Freq: Two times a day (BID) | INTRAVENOUS | Status: DC
Start: 2020-11-30 — End: 2020-12-02
  Administered 2020-11-30: 13:00:00 10 mL via INTRAVENOUS

## 2020-11-30 MED ORDER — LIDOCAINE HCL (PF) 1 % IJ SOLN
1 | INTRAMUSCULAR | Status: AC
Start: 2020-11-30 — End: 2020-11-30

## 2020-11-30 MED ORDER — LACTATED RINGERS IV SOLN
INTRAVENOUS | Status: DC | PRN
Start: 2020-11-30 — End: 2020-11-30

## 2020-11-30 MED ORDER — PHENYLEPHRINE HCL (PRESSORS) 10 MG/ML IV SOLN
10 MG/ML | INTRAVENOUS | Status: DC | PRN
Start: 2020-11-30 — End: 2020-11-30
  Administered 2020-11-30: 23:00:00 200 via INTRAVENOUS

## 2020-11-30 MED ORDER — SODIUM CHLORIDE 0.9 % IV SOLN
0.9 | INTRAVENOUS | Status: DC
Start: 2020-11-30 — End: 2020-12-02
  Administered 2020-11-30 – 2020-12-02 (×3): via INTRAVENOUS

## 2020-11-30 MED ORDER — LACTATED RINGERS IV SOLN
INTRAVENOUS | Status: DC | PRN
Start: 2020-11-30 — End: 2020-11-30
  Administered 2020-11-30: 23:00:00 via INTRAVENOUS

## 2020-11-30 MED ORDER — ACETAMINOPHEN 325 MG PO TABS
325 MG | Freq: Four times a day (QID) | ORAL | Status: DC | PRN
Start: 2020-11-30 — End: 2020-12-02
  Administered 2020-12-01 – 2020-12-02 (×2): 650 mg via ORAL

## 2020-11-30 MED ORDER — ACETAMINOPHEN 650 MG RE SUPP
650 MG | Freq: Four times a day (QID) | RECTAL | Status: DC | PRN
Start: 2020-11-30 — End: 2020-12-02

## 2020-11-30 MED ORDER — ONDANSETRON 4 MG PO TBDP
4 MG | Freq: Three times a day (TID) | ORAL | Status: DC | PRN
Start: 2020-11-30 — End: 2020-12-02

## 2020-11-30 MED ORDER — NORMAL SALINE FLUSH 0.9 % IV SOLN
0.9 | INTRAVENOUS | Status: DC | PRN
Start: 2020-11-30 — End: 2020-12-02

## 2020-11-30 MED ORDER — FENTANYL CITRATE (PF) 100 MCG/2ML IJ SOLN
100 | INTRAMUSCULAR | Status: AC
Start: 2020-11-30 — End: 2020-11-30

## 2020-11-30 MED ORDER — ENOXAPARIN SODIUM 40 MG/0.4ML IJ SOSY
40 MG/0.4ML | Freq: Every day | INTRAMUSCULAR | Status: DC
Start: 2020-11-30 — End: 2020-11-30
  Administered 2020-11-30: 13:00:00 40 mg via SUBCUTANEOUS

## 2020-11-30 MED ORDER — VANCOMYCIN HCL 1 G IV SOLR
1 g | Freq: Once | INTRAVENOUS | Status: AC
Start: 2020-11-30 — End: 2020-11-30
  Administered 2020-11-30: 06:00:00 1000 mg via INTRAVENOUS

## 2020-11-30 MED ORDER — ONDANSETRON HCL 4 MG/2ML IJ SOLN
4 MG/2ML | Freq: Four times a day (QID) | INTRAMUSCULAR | Status: DC | PRN
Start: 2020-11-30 — End: 2020-12-02

## 2020-11-30 MED ORDER — KETOROLAC TROMETHAMINE 15 MG/ML IJ SOLN
15 MG/ML | Freq: Four times a day (QID) | INTRAMUSCULAR | Status: DC | PRN
Start: 2020-11-30 — End: 2020-12-02
  Administered 2020-12-01: 22:00:00 15 mg via INTRAVENOUS

## 2020-11-30 MED ORDER — ONDANSETRON HCL 4 MG/2ML IJ SOLN
4 MG/2ML | INTRAMUSCULAR | Status: DC | PRN
Start: 2020-11-30 — End: 2020-11-30
  Administered 2020-11-30: 23:00:00 4 via INTRAVENOUS

## 2020-11-30 MED ORDER — CULTURELLE PO CAPS
Freq: Two times a day (BID) | ORAL | Status: DC
Start: 2020-11-30 — End: 2020-12-02
  Administered 2020-11-30 – 2020-12-02 (×6): 1 via ORAL

## 2020-11-30 MED ORDER — PIPERACILLIN SOD-TAZOBACTAM SO 3.375 (3-0.375) G IV SOLR
3.375 (3-0.375) g | Freq: Three times a day (TID) | INTRAVENOUS | Status: DC
Start: 2020-11-30 — End: 2020-12-02
  Administered 2020-11-30 – 2020-12-02 (×7): 3375 mg via INTRAVENOUS

## 2020-11-30 MED ORDER — GLYCOPYRROLATE 1 MG/5ML IJ SOLN
1 MG/5ML | INTRAMUSCULAR | Status: DC | PRN
Start: 2020-11-30 — End: 2020-11-30
  Administered 2020-11-30: 23:00:00 .4 via INTRAVENOUS

## 2020-11-30 MED ORDER — PROPOFOL 200 MG/20ML IV EMUL
200 | INTRAVENOUS | Status: DC | PRN
Start: 2020-11-30 — End: 2020-11-30
  Administered 2020-11-30: 23:00:00 150 via INTRAVENOUS

## 2020-11-30 MED ORDER — MIDAZOLAM HCL 2 MG/2ML IJ SOLN
2 MG/ML | INTRAMUSCULAR | Status: DC | PRN
Start: 2020-11-30 — End: 2020-11-30
  Administered 2020-11-30: 23:00:00 2 via INTRAVENOUS

## 2020-11-30 MED ORDER — ASPIRIN 81 MG PO TBEC
81 MG | Freq: Every day | ORAL | Status: DC
Start: 2020-11-30 — End: 2020-12-02
  Administered 2020-11-30 – 2020-12-02 (×3): 81 mg via ORAL

## 2020-11-30 MED ORDER — SODIUM CHLORIDE 0.9 % IV SOLN
0.9 | INTRAVENOUS | Status: DC | PRN
Start: 2020-11-30 — End: 2020-12-02

## 2020-11-30 MED ORDER — TRAMADOL HCL 50 MG PO TABS
50 MG | Freq: Four times a day (QID) | ORAL | Status: DC | PRN
Start: 2020-11-30 — End: 2020-12-02

## 2020-11-30 MED ORDER — FENTANYL CITRATE (PF) 100 MCG/2ML IJ SOLN
100 MCG/2ML | INTRAMUSCULAR | Status: DC | PRN
Start: 2020-11-30 — End: 2020-11-30
  Administered 2020-11-30 – 2020-12-01 (×5): 50 via INTRAVENOUS

## 2020-11-30 MED ORDER — ALBUTEROL SULFATE HFA 108 (90 BASE) MCG/ACT IN AERS
108 (90 Base) MCG/ACT | Freq: Four times a day (QID) | RESPIRATORY_TRACT | Status: DC | PRN
Start: 2020-11-30 — End: 2020-12-02

## 2020-11-30 MED ORDER — POLYETHYLENE GLYCOL 3350 17 G PO PACK
17 g | Freq: Every day | ORAL | Status: DC | PRN
Start: 2020-11-30 — End: 2020-12-02

## 2020-11-30 MED ORDER — LIDOCAINE HCL (PF) 1 % IJ SOLN
1 % | INTRAMUSCULAR | Status: DC | PRN
Start: 2020-11-30 — End: 2020-11-30
  Administered 2020-11-30: 23:00:00 50 via INTRAVENOUS

## 2020-11-30 MED ORDER — PHENYLEPHRINE HCL (PRESSORS) 10 MG/ML IV SOLN
10 | INTRAVENOUS | Status: AC
Start: 2020-11-30 — End: 2020-11-30

## 2020-11-30 MED ORDER — ONDANSETRON HCL 4 MG/2ML IJ SOLN
4 | INTRAMUSCULAR | Status: AC
Start: 2020-11-30 — End: 2020-11-30

## 2020-11-30 MED ORDER — DEXAMETHASONE SOD PHOSPHATE PF 10 MG/ML IJ SOLN
10 | INTRAMUSCULAR | Status: AC
Start: 2020-11-30 — End: 2020-11-30

## 2020-11-30 MED ORDER — ATORVASTATIN CALCIUM 20 MG PO TABS
20 MG | Freq: Every day | ORAL | Status: DC
Start: 2020-11-30 — End: 2020-12-02
  Administered 2020-11-30 – 2020-12-02 (×3): 20 mg via ORAL

## 2020-11-30 MED ORDER — PROPOFOL 200 MG/20ML IV EMUL
200 | INTRAVENOUS | Status: AC
Start: 2020-11-30 — End: 2020-11-30

## 2020-11-30 MED ORDER — TRAMADOL HCL 50 MG PO TABS
50 MG | Freq: Four times a day (QID) | ORAL | Status: DC | PRN
Start: 2020-11-30 — End: 2020-12-02
  Administered 2020-11-30 – 2020-12-02 (×7): 100 mg via ORAL

## 2020-11-30 MED ORDER — CLOPIDOGREL BISULFATE 75 MG PO TABS
75 MG | Freq: Every day | ORAL | Status: DC
Start: 2020-11-30 — End: 2020-12-02
  Administered 2020-11-30 – 2020-12-02 (×3): 75 mg via ORAL

## 2020-11-30 MED ORDER — FENTANYL CITRATE (PF) 100 MCG/2ML IJ SOLN
100 MCG/2ML | Freq: Once | INTRAMUSCULAR | Status: AC
Start: 2020-11-30 — End: 2020-11-30
  Administered 2020-11-30: 07:00:00 100 ug via INTRAVENOUS

## 2020-11-30 MED ORDER — DEXAMETHASONE SOD PHOSPHATE PF 10 MG/ML IJ SOLN
10 MG/ML | INTRAMUSCULAR | Status: DC | PRN
Start: 2020-11-30 — End: 2020-11-30
  Administered 2020-11-30: 23:00:00 10 via INTRAVENOUS

## 2020-11-30 MED ORDER — KETOROLAC TROMETHAMINE 30 MG/ML IJ SOLN
30 MG/ML | Freq: Once | INTRAMUSCULAR | Status: AC
Start: 2020-11-30 — End: 2020-11-30
  Administered 2020-11-30: 04:00:00 30 mg via INTRAVENOUS

## 2020-11-30 MED FILL — PIPERACILLIN SOD-TAZOBACTAM SO 3.375 (3-0.375) G IV SOLR: 3.375 (3-0.375) g | INTRAVENOUS | Qty: 3375

## 2020-11-30 MED FILL — DIPRIVAN 200 MG/20ML IV EMUL: 200 MG/20ML | INTRAVENOUS | Qty: 20

## 2020-11-30 MED FILL — TRAMADOL HCL 50 MG PO TABS: 50 mg | ORAL | Qty: 2

## 2020-11-30 MED FILL — PHENYLEPHRINE HCL (PRESSORS) 10 MG/ML IV SOLN: 10 mg/mL | INTRAVENOUS | Qty: 1

## 2020-11-30 MED FILL — VANCOMYCIN HCL 750 MG IV SOLR: 750 mg | INTRAVENOUS | Qty: 750

## 2020-11-30 MED FILL — GLYCOPYRROLATE 1 MG/5ML IJ SOLN: 1 MG/5ML | INTRAMUSCULAR | Qty: 5

## 2020-11-30 MED FILL — CULTURELLE PO CAPS: ORAL | Qty: 1

## 2020-11-30 MED FILL — KETOROLAC TROMETHAMINE 30 MG/ML IJ SOLN: 30 mg/mL | INTRAMUSCULAR | Qty: 1

## 2020-11-30 MED FILL — ONDANSETRON HCL 4 MG/2ML IJ SOLN: 4 MG/2ML | INTRAMUSCULAR | Qty: 2

## 2020-11-30 MED FILL — ASPIRIN LOW DOSE 81 MG PO TBEC: 81 mg | ORAL | Qty: 1

## 2020-11-30 MED FILL — LIDOCAINE HCL (PF) 1 % IJ SOLN: 1 % | INTRAMUSCULAR | Qty: 5

## 2020-11-30 MED FILL — VANCOMYCIN HCL 500 MG IV SOLR: 500 mg | INTRAVENOUS | Qty: 500

## 2020-11-30 MED FILL — MIDAZOLAM HCL 2 MG/2ML IJ SOLN: 2 mg/mL | INTRAMUSCULAR | Qty: 2

## 2020-11-30 MED FILL — FENTANYL CITRATE (PF) 100 MCG/2ML IJ SOLN: 100 MCG/2ML | INTRAMUSCULAR | Qty: 2

## 2020-11-30 MED FILL — DEXAMETHASONE SOD PHOSPHATE PF 10 MG/ML IJ SOLN: 10 mg/mL | INTRAMUSCULAR | Qty: 1

## 2020-11-30 MED FILL — ENOXAPARIN SODIUM 40 MG/0.4ML IJ SOSY: 40 MG/0.4ML | INTRAMUSCULAR | Qty: 0.4

## 2020-11-30 MED FILL — DEXTROSE 5 % IV SOLN: 5 % | INTRAVENOUS | Qty: 250

## 2020-11-30 MED FILL — VANCOMYCIN HCL 1 G IV SOLR: 1 g | INTRAVENOUS | Qty: 1000

## 2020-11-30 MED FILL — ATORVASTATIN CALCIUM 20 MG PO TABS: 20 mg | ORAL | Qty: 1

## 2020-11-30 MED FILL — CLOPIDOGREL BISULFATE 75 MG PO TABS: 75 mg | ORAL | Qty: 1

## 2020-11-30 NOTE — H&P (Signed)
History & Physical    Patient:  Mark Buchanan  Date of Birth: [birth year]  Date of Service: 11/30/2020  MRN: [id number]   Acct:   [id number]   Primary Care Physician: Damita Lack, MD    Chief Complaint:   Chief Complaint   Patient presents with   ??? Arm Injury     A piece of wire went through patients left arm two weeks ago Patient came in and was seen but he left AMA before any treatment. Tonight is swollen having drainage.        History of Present Illness:       The patient is a 55 y.o. male who presented to the emergency room complaining of right arm pain, swelling, redness started couple of days ago.  The patient states that about 2 weeks ago he got poked with a piece of wire of a grinder.  He was able to remove the wire but since then has been having some redness and drainage on his right forearm.  He did not attempt to treat it and last couple of days symptoms progressively worsened.  He developed severe sharp, squeezing pain, constant.  He noticed yellowish drainage at the site of puncture.  Reports no fevers, no chills, no nausea or vomiting.  Work-up in the emergency room revealed low-grade fever 99.3, heart rate 109, blood pressure 135/81.  O2 sats 98% on room air.  BMP revealed sodium 133, potassium 4.3, chloride 94, CO2 26, BUN 13 and creatinine 1.26.  WBC elevated 14.1, H&H normal, platelets 294.  Urine tox screen negative.  COVID was negative.  UA negative.  X-ray of the right radius/ulna 2 views revealed soft tissue swelling without radiopaque foreign body or bony erosive changes.   Patient was started on IV vancomycin and Zosyn.  Orthopedic surgeon Dr. Dallas Schimke was contacted by ER physician.    Due to the extent of patient infection and possible abscess he was deemed appropriate for admission    Past Medical History:        Diagnosis Date   ??? Hyperlipidemia        Past Surgical History:        Procedure Laterality Date   ??? CORONARY ANGIOPLASTY WITH STENT PLACEMENT     ??? EXPLORATION OF WOUND OF  EXTREMITY Left 02/14/2016    WOUND EXPLORATION AND REVISION performed by Romie Levee, MD at [facility] OR       Home Medications:   No current facility-administered medications on file prior to encounter.     Current Outpatient Medications on File Prior to Encounter   Medication Sig Dispense Refill   ??? clindamycin (CLEOCIN) 300 MG capsule Take 1 capsule by mouth 3 times daily for 10 days 30 capsule 0   ??? aspirin 81 MG tablet Take 81 mg by mouth daily     ??? atorvastatin (LIPITOR) 20 MG tablet Take 20 mg by mouth daily     ??? albuterol (PROVENTIL HFA) 108 (90 BASE) MCG/ACT inhaler Inhale 2 puffs into the lungs every 6 hours as needed for Wheezing 1 Inhaler 3       Allergies:  Patient has no known allergies.    Social History:    reports that he has quit smoking. His smoking use included cigarettes. He smoked 1.00 pack per day. He has never used smokeless tobacco. He reports that he does not drink alcohol and does not use drugs.    Family History:   Mother had diabetes  Father had  heart disease, CABG    Review of systems:  Constitutional: no fever, no night sweats, no fatigue  Head: no headache, no head injury, no migranes.  Eye: no blurring of vision, no double vision.  Ears: no hearing difficulty, no tinnitus  Mouth/throat: no sore throat  Lungs: no cough, no shortness of breath, no wheeze  CVS: no palpitation, no chest pain, no shortness of breath  GI: no abdominal pain, no nausea , no vomiting, no constipation  GUS: no dysuria, frequency and urgency, no hematuria, no kidney stones  Musculoskeletal: Positive for swelling, redness and pain of the right forearm  Endocrine: no polyuria, polydypsia, no cold or heat intolerence  Hematology: no anemia, no easy brusing or bleeding, no hx of clotting disorder  Dermatology: Positive redness and open wound of the right forearm  Psychiatry: no depression, no anxiety,no panic attacks, no suicide ideation  Neurology: no syncope, no seizures, no numbness or tingling of hands, no  numbness or tingling of feet, no paresis    Vitals:   Vitals:    11/30/20 0815   BP: 102/65   Pulse: 65   Resp: 18   Temp: 97.8 ??F (36.6 ??C)   SpO2: 96%      BMI: Body mass index is 20.92 kg/m??.    Physical Exam:  General Appearance: alert and oriented to person, place and time, in no acute distress  Cardiovascular: normal rate, regular rhythm, normal S1 and S2, no murmurs, rubs, clicks, or gallops, distal pulses intact  Pulmonary/Chest: clear to auscultation bilaterally- no wheezes, rales or rhonchi, normal air movement, no respiratory distress  Abdomen: soft, non-tender, non-distended, normal bowel sounds  Extremities: no cyanosis, clubbing or edema  Skin: Right forearm is swollen, diffusely erythematous, hot and tender to touch.  No open puncture wound is noted in the center with small amount of purulent drainage  Head: normocephalic and atraumatic  Eyes: pupils equal, round, and reactive to light  Neck: supple and non-tender without mass, no thyromegaly   Neurological: alert, oriented, normal speech, no focal findings or movement disorder noted    Review of Labs and Diagnostic Testing:    Recent Results (from the past 24 hour(s))   CBC with Auto Differential    Collection Time: 11/29/20 11:14 PM   Result Value Ref Range    WBC 14.1 (H) 3.5 - 11.0 k/uL    RBC 4.62 4.5 - 5.9 m/uL    Hemoglobin 13.8 13.5 - 17.5 g/dL    Hematocrit 69.6 41 - 53 %    MCV 89.7 80 - 100 fL    MCH 29.8 26 - 34 pg    MCHC 33.2 31 - 37 g/dL    RDW 29.5 28.4 - 13.2 %    Platelets 294 140 - 450 k/uL    Differential Type YES     Seg Neutrophils 73 39 - 75 %    Lymphocytes 16 13 - 44 %    Monocytes 9 5 - 9 %    Eosinophils % 1 0 - 5 %    Basophils 1 0 - 2 %    Segs Absolute 10.50 (H) 2.1 - 6.5 k/uL    Absolute Lymph # 2.20 1.0 - 4.8 k/uL    Absolute Mono # 1.20 (H) 0.0 - 1.0 k/uL    Absolute Eos # 0.10 0.0 - 0.4 k/uL    Basophils Absolute 0.10 0.0 - 0.2 k/uL   Basic Metabolic Panel    Collection Time: 11/29/20 11:14 PM  Result Value Ref Range     Glucose 103 (H) 70 - 99 mg/dL    BUN 13 6 - 20 mg/dL    CREATININE 4.25 (H) 0.70 - 1.20 mg/dL    Bun/Cre Ratio 10 9 - 20    Calcium 9.8 8.6 - 10.4 mg/dL    Sodium 956 (L) 387 - 144 mmol/L    Potassium 4.3 3.7 - 5.3 mmol/L    Chloride 94 (L) 98 - 107 mmol/L    CO2 26 20 - 31 mmol/L    Anion Gap 13 9 - 17 mmol/L    GFR Non-African American 59 (L) >60 mL/min    GFR African American >60 >60 mL/min    GFR Comment         Urine Drug Screen    Collection Time: 11/29/20 11:30 PM   Result Value Ref Range    Amphetamine Screen, Ur NEGATIVE NEGATIVE    Barbiturate Screen, Ur NEGATIVE NEGATIVE    Benzodiazepine Screen, Urine NEGATIVE NEGATIVE    Cocaine Metabolite, Urine NEGATIVE NEGATIVE    Methadone Screen, Urine NEGATIVE NEGATIVE    Opiates, Urine NEGATIVE NEGATIVE    Phencyclidine, Urine NEGATIVE NEGATIVE    Propoxyphene, Urine NEGATIVE NEGATIVE    Cannabinoid Scrn, Ur NEGATIVE NEGATIVE    Oxycodone Screen, Ur NEGATIVE NEGATIVE    Methamphetamine, Urine NEGATIVE NEGATIVE    Tricyclic Antidepressants, Urine NEGATIVE NEGATIVE   Urinalysis    Collection Time: 11/29/20 11:30 PM   Result Value Ref Range    Color, UA Yellow Yellow    Turbidity UA Clear Clear    Glucose, Ur NEGATIVE NEGATIVE    Bilirubin Urine NEGATIVE NEGATIVE    Ketones, Urine NEGATIVE NEGATIVE    Specific Gravity, UA 1.010 1.005 - 1.030    Urine Hgb NEGATIVE NEGATIVE    pH, UA 7.0 5.0 - 8.0    Protein, UA NEGATIVE NEGATIVE    Urobilinogen, Urine Normal Normal    Nitrite, Urine NEGATIVE NEGATIVE    Leukocyte Esterase, Urine NEGATIVE NEGATIVE    Urinalysis Comments         COVID-19, Rapid    Collection Time: 11/30/20  6:53 AM    Specimen: Nasopharyngeal Swab   Result Value Ref Range    Specimen Description .NASOPHARYNGEAL SWAB     SARS-CoV-2, Rapid Not Detected Not Detected       Radiology:     XR RADIUS ULNA RIGHT (2 VIEWS)    Result Date: 11/29/2020  XR RADIUS ULNA RIGHT (2 VIEWS): HISTORY: Reason for exam:->Right arm swelling, right forearm cellulitis rule  out foreign body. COMPARISON: None available. TECHNIQUE: 2 radiographic view(s) obtained. FINDINGS: BONES/JOINT SPACES: There is no acute fracture or dislocation. The joint spaces are well-maintained. SOFT TISSUES: There is soft tissue swelling without radiopaque foreign body. No bony erosive changes are identified.     Soft tissue swelling without radiopaque foreign body or bony erosive changes.           Assessment/ Plan:    1.  Right forearm cellulitis and possible abscess -patient started on IV Vanco and Zosyn on 11/30/2020, consult orthopedic surgeon Dr. Dallas Schimke for evaluation, may need I&D.  Obtain blood cultures, wound cultures.  Pain management with as needed Tylenol, IV Toradol and p.o. tramadol  2.  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  Hyperlipidemia -on Lipitor  4.  PAD, s/p left to right femorofemoral bypass - continue on Plavix, aspirin, Lipitor, follows up with Dr. Voncille Lo  5.  Tobacco dependence -  patient was offered nicotine patch, however declined  6.  History of COPD        Medical Necessity: Inpatient admission is appropriate for this patient secondary to the need of IV antibiotics, wound care, orthopedic consultation    Estimated length of stay: 3 days.    The beneficiary may reasonably be expected to be discharged or transferred to a hospital within 96 hours after admission.    DVT prophylaxis:   [x]  Lovenox   []  SCDs   []  SQ Heparin   []  Encourage ambulation, low risk for DVT, no chemical or mechanical    prophylaxis necessary      []  Already on Anticoagulation    Anticipated Disposition upon discharge:   [x]  Home   []  Home with Home Health   []  Skilled Nursing Facility   []  [facility]      Electronically signed by Damita LackGohar Marcela Alatorre, MD on 11/30/2020 at 9:19 AM

## 2020-11-30 NOTE — ED Notes (Signed)
Updated wife on admission and room number.      Paulene Floor, RN  11/30/20 [phone]

## 2020-11-30 NOTE — Progress Notes (Signed)
SW and RN case manager met with pt to complete assessment during quality rounds. Pt is alert and oriented but keeps his eyes closed when talking most of the time. Pt is a 55 year old married male admitted for rt arm cellulitis. Pt lives with his spouse in their home in [place]. Pt does not use any DME or community services at home. Pt and spouse both drive and can get to appointments without concerns.     Pt is a full code and follows with Dr Georgette Shell as PCP. Pt does not have advance directives and states that his wife would be his decision maker if needed. ACP note completed with pt. Pt reports that his medications are affordable.     Pt plans to return home at discharge. Pt and SW discussed coming in for IV antibiotics if this is a need at discharge and pt is willing to do these as an outpatient. Pt identifies no discharge needs or concerns at this time. SW will follow and remain available as needed. Venita Lick MSW LSW 11/30/2020

## 2020-11-30 NOTE — Progress Notes (Signed)
LR hanging, X2 extension set tubings in place, tele patches applied, and BP cuff on.

## 2020-11-30 NOTE — Anesthesia Pre-Procedure Evaluation (Signed)
Department of Anesthesiology  Preprocedure Note       Name:  Mark Buchanan   Age:  55 y.o.  DOB:  [birth year]                                          MRN:  [id number]         Date:  11/30/2020      Surgeon: Moishe Spice):  Denny Peon, MD    Procedure: Procedure(s):  INCISION AND DRAINAGE OF RIGHT FOREARM    Medications prior to admission:   Prior to Admission medications    Medication Sig Start Date End Date Taking? Authorizing Provider   clopidogrel (PLAVIX) 75 MG tablet Take 75 mg by mouth daily   Yes Historical Provider, MD   clindamycin (CLEOCIN) 300 MG capsule Take 1 capsule by mouth 3 times daily for 10 days 11/29/20 12/09/20  Deanna Artis, MD   aspirin 81 MG tablet Take 81 mg by mouth daily    Historical Provider, MD   atorvastatin (LIPITOR) 20 MG tablet Take 20 mg by mouth daily    Historical Provider, MD   albuterol (PROVENTIL HFA) 108 (90 BASE) MCG/ACT inhaler Inhale 2 puffs into the lungs every 6 hours as needed for Wheezing 02/11/14 02/11/15  Demetrio Lapping, MD       Current medications:    Current Facility-Administered Medications   Medication Dose Route Frequency Provider Last Rate Last Admin   ??? albuterol sulfate HFA 108 (90 Base) MCG/ACT inhaler 2 puff  2 puff Inhalation Q6H PRN Damita Lack, MD       ??? aspirin EC tablet 81 mg  81 mg Oral Daily Damita Lack, MD   81 mg at 11/30/20 0925   ??? atorvastatin (LIPITOR) tablet 20 mg  20 mg Oral Daily Damita Lack, MD   20 mg at 11/30/20 9935   ??? sodium chloride flush 0.9 % injection 5-40 mL  5-40 mL IntraVENous 2 times per day Damita Lack, MD   10 mL at 11/30/20 0926   ??? sodium chloride flush 0.9 % injection 5-40 mL  5-40 mL IntraVENous PRN Damita Lack, MD       ??? 0.9 % sodium chloride infusion   IntraVENous PRN Damita Lack, MD       ??? enoxaparin (LOVENOX) injection 40 mg  40 mg SubCUTAneous Daily Damita Lack, MD   40 mg at 11/30/20 0925   ??? ondansetron (ZOFRAN-ODT) disintegrating tablet 4 mg  4 mg Oral Q8H PRN Damita Lack, MD         Or   ??? ondansetron (ZOFRAN) injection 4 mg  4 mg IntraVENous Q6H PRN Damita Lack, MD       ??? polyethylene glycol (GLYCOLAX) packet 17 g  17 g Oral Daily PRN Damita Lack, MD       ??? acetaminophen (TYLENOL) tablet 650 mg  650 mg Oral Q6H PRN Damita Lack, MD        Or   ??? acetaminophen (TYLENOL) suppository 650 mg  650 mg Rectal Q6H PRN Damita Lack, MD       ??? 0.9 % sodium chloride infusion   IntraVENous Continuous Damita Lack, MD 75 mL/hr at 11/30/20 0930 New Bag at 11/30/20 0930   ??? piperacillin-tazobactam (ZOSYN) 3,375 mg in dextrose 5 % 50 mL IVPB extended infusion (mini-bag)  3,375 mg IntraVENous Q8H Damita Lack, MD 12.5 mL/hr  at 11/30/20 1802 3,375 mg at 11/30/20 1802   ??? traMADol (ULTRAM) tablet 50 mg  50 mg Oral Q6H PRN Damita Lack, MD        Or   ??? traMADol (ULTRAM) tablet 100 mg  100 mg Oral Q6H PRN Damita Lack, MD   100 mg at 11/30/20 1602   ??? ketorolac (TORADOL) injection 15 mg  15 mg IntraVENous Q6H PRN Damita Lack, MD       ??? lactobacillus (CULTURELLE) capsule 1 capsule  1 capsule Oral BID WC Damita Lack, MD   1 capsule at 11/30/20 1603   ??? clopidogrel (PLAVIX) tablet 75 mg  75 mg Oral Daily Damita Lack, MD   75 mg at 11/30/20 1056   ??? vancomycin (VANCOCIN) 1,250 mg in dextrose 5 % 250 mL IVPB  1,250 mg IntraVENous Q24H Damita Lack, MD   Stopped at 11/30/20 1801       Allergies:  No Known Allergies    Problem List:    Patient Active Problem List   Diagnosis Code   ??? Nicotine addiction F17.200   ??? GERD (gastroesophageal reflux disease) K21.9   ??? Claudication in peripheral vascular disease (HCC) I73.9   ??? Arthralgia of right hip M25.551   ??? Need for pneumococcal vaccination Z23   ??? Need for Tdap vaccination Z23   ??? ASVD (arteriosclerotic vascular disease) I70.90   ??? Claudication (HCC) I73.9   ??? Essential hypertension I10   ??? Cellulitis of arm L03.119   ??? Right arm cellulitis L03.113       Past Medical History:        Diagnosis Date   ??? Hyperlipidemia         Past Surgical History:        Procedure Laterality Date   ??? CORONARY ANGIOPLASTY WITH STENT PLACEMENT     ??? EXPLORATION OF WOUND OF EXTREMITY Left 02/14/2016    WOUND EXPLORATION AND REVISION performed by Romie Levee, MD at [facility] OR       Social History:    Social History     Tobacco Use   ??? Smoking status: Former Smoker     Packs/day: 1.00     Types: Cigarettes   ??? Smokeless tobacco: Never Used   Substance Use Topics   ??? Alcohol use: No                                Counseling given: Not Answered      Vital Signs (Current):   Vitals:    11/30/20 0952 11/30/20 1230 11/30/20 1602 11/30/20 1632   BP:  107/60 (!) 100/51    Pulse:  73 68    Resp:  18 18 18    Temp:  36.6 ??C (97.9 ??F) 36.7 ??C (98.1 ??F)    TempSrc:  Oral Oral    SpO2:  95% 99%    Weight:       Height: 5\' 7"  (1.702 m)                                                 BP Readings from Last 3 Encounters:   11/30/20 (!) 100/51   11/29/20 (!) 149/88   09/12/17 (!) 166/96       NPO Status: Time of last liquid consumption: 0940  Time of last solid consumption: 2100                        Date of last liquid consumption: 11/30/20                        Date of last solid food consumption: 11/29/20    BMI:   Wt Readings from Last 3 Encounters:   11/30/20 133 lb 9.6 oz (60.6 kg)   11/29/20 135 lb (61.2 kg)   09/12/17 145 lb (65.8 kg)     Body mass index is 20.92 kg/m??.    CBC:   Lab Results   Component Value Date    WBC 14.1 11/29/2020    RBC 4.62 11/29/2020    HGB 13.8 11/29/2020    HCT 41.5 11/29/2020    MCV 89.7 11/29/2020    RDW 14.0 11/29/2020    PLT 294 11/29/2020       CMP:   Lab Results   Component Value Date    NA 133 11/29/2020    K 4.3 11/29/2020    CL 94 11/29/2020    CO2 26 11/29/2020    BUN 13 11/29/2020    CREATININE 1.26 11/29/2020    GFRAA >60 11/29/2020    LABGLOM 59 11/29/2020    GLUCOSE 103 11/29/2020    CALCIUM 9.8 11/29/2020       POC Tests: No results for input(s): POCGLU, POCNA, POCK, POCCL, POCBUN, POCHEMO,  POCHCT in the last 72 hours.    Coags:   Lab Results   Component Value Date    PROTIME 10.3 02/14/2016    INR 1.0 02/14/2016    APTT 23.6 02/14/2016       HCG (If Applicable): No results found for: PREGTESTUR, PREGSERUM, HCG, HCGQUANT     ABGs: No results found for: PHART, PO2ART, PCO2ART, HCO3ART, BEART, O2SATART     Type & Screen (If Applicable):  No results found for: LABABO, LABRH    Drug/Infectious Status (If Applicable):  No results found for: HIV, HEPCAB    COVID-19 Screening (If Applicable):   Lab Results   Component Value Date    COVID19 Not Detected 11/30/2020           Anesthesia Evaluation  Patient summary reviewed and Nursing notes reviewed  Airway: Mallampati: II  TM distance: >3 FB   Neck ROM: full  Mouth opening: > = 3 FB   Dental:    (+) edentulous      Pulmonary:normal exam  breath sounds clear to auscultation  (+) current smoker          Patient did not smoke on day of surgery.                 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      Vascular:   + PVD, aortic or cerebral, .        ROS comment: Patient states that he has had vascular surgery and pointed to bilateral femoral / groin areas and said it was balloon or stented and doesn't recall which one it was.. Other Findings:           Anesthesia Plan      general     ASA 3       Induction: intravenous.    MIPS: Postoperative opioids intended and Prophylactic antiemetics administered.  Anesthetic plan and risks discussed with patient.      Plan discussed with attending.                    Deirdre Priest, APRN -  CRNA   11/30/2020

## 2020-11-30 NOTE — Progress Notes (Signed)
Comprehensive Nutrition Assessment    Type and Reason for Visit:  Initial    Nutrition Recommendations/Plan:   1. Continue current diet.     Malnutrition Assessment:  Malnutrition Status:  Insufficient data (11/30/20 0951)    Context:  Acute Illness     Findings of the 6 clinical characteristics of malnutrition:  Energy Intake:  Unable to assess  Weight Loss:  No significant weight loss     Body Fat Loss:  Unable to assess     Muscle Mass Loss:  Unable to assess    Fluid Accumulation:  Mild Extremities (RLE non pitting)  Grip Strength:  Not Performed    Nutrition Assessment:    Increased nutrient needs r/t acute injury/trauma aeb presence of wound. Pt asleep at this time, 3 attempts to visit Pt without success.     Nutrition Related Findings:      Wound Type: Open Wounds (wire wound )       Current Nutrition Intake & Therapies:    Average Meal Intake: Unable to assess  Average Supplements Intake: None Ordered  Diet NPO    Anthropometric Measures:  Height: 5\' 7"  (170.2 cm)  Ideal Body Weight (IBW): 148 lbs (67 kg)    Admission Body Weight: 139 lb 11 oz (63.4 kg)  Current Body Weight: 139 lb 11 oz (63.4 kg), 94.4 % IBW. Weight Source: Bed Scale  Current BMI (kg/m2): 21.9  Usual Body Weight:  (unknown, limited historical )     Weight Adjustment For: No Adjustment                 BMI Categories: Normal Weight (BMI 18.5-24.9)    Estimated Daily Nutrient Needs:  Energy Requirements Based On: Kcal/kg  Weight Used for Energy Requirements: Current  Energy (kcal/day): 07-26-1992 (25-28/kg)  Weight Used for Protein Requirements: Current  Protein (g/day): 73-85g (1.2-1.4g/kg)  Method Used for Fluid Requirements: 1 ml/kcal  Fluid (ml/day): 1697 ml (28/kg)    Nutrition Diagnosis:   ?? Increased nutrient needs related to acute injury/trauma as evidenced by wounds      Nutrition Interventions:   Food and/or Nutrient Delivery: Continue Current Diet  Nutrition Education/Counseling: No recommendation at this time  Coordination of  Nutrition Care: Continue to monitor while inpatient       Goals:     Goals: PO intake 75% or greater     Recent Labs     11/29/20  2314   NA 133*   K 4.3   CL 94*   CO2 26   BUN 13   CREATININE 1.26*   GLUCOSE 103*   GFR         No results found for: LABALBU   Nutrition Monitoring and Evaluation:      Food/Nutrient Intake Outcomes: Food and Nutrient Intake  Physical Signs/Symptoms Outcomes: Biochemical Data,Skin,Weight    Discharge Planning:    Continue current diet     12/01/20, RD, LD  Contact: Jeanett Schlein

## 2020-11-30 NOTE — Telephone Encounter (Signed)
Patient came in the office and needs refill for his pain medicine   HYDROcodone-acetaminophen (NORCO/VICODIN) 5-325 MG tablet    Pharmacy :  [facility] on [address]

## 2020-11-30 NOTE — Progress Notes (Incomplete)
Pt back from surgery. Drowsy but oriented x4. 98% on RA. VSS. Patient rating pain 7/10 in R arm but is resting with eyes closed and no signs of distress noted. Patient agreeable to wait until tramadol is due. Right arm elevated. SCD's on BLE. Denies any needs at this time. Patients wife is at bedside and assists patient to eat once awake. No N/V and bowel sounds are active x4 quadrants. Tramadol given at 2207.

## 2020-11-30 NOTE — Anesthesia Post-Procedure Evaluation (Signed)
Department of Anesthesiology  Postprocedure Note    Patient: Mark Buchanan  MRN: [id number]  Birthdate: 1965-08-04  Date of evaluation: 11/30/2020  Time:  8:36 PM     Procedure Summary     Date: 11/30/20 Room / Location: [facility] OR 02 / [facility]    Anesthesia Start: 1859 Anesthesia Stop: 1946    Procedure: IRRIGATION AND DRAINAGE OF RIGHT FOREARM (Right ) Diagnosis:       Abscess of arm, right      (Abscess of arm, right [L02.413])    Surgeons: Denny Peon, MD Responsible Provider: Deirdre Priest, APRN - CRNA    Anesthesia Type: general ASA Status: 3          Anesthesia Type: No value filed.    Aldrete Phase I: Aldrete Score: 10    Aldrete Phase II:      Last vitals: Reviewed and per EMR flowsheets.       Anesthesia Post Evaluation    Patient location during evaluation: PACU  Patient participation: complete - patient participated  Level of consciousness: awake and lethargic  Pain score: 6 (patient has had 150 mcg of fentanyl IV and only moved his pain scale from 7 down to 6. no outward signs of discomfort noted. remains with eyes closed and stabel VSS wiithout the presence of abnormally high bp or tachycardia.)  Airway patency: patent  Nausea & Vomiting: no nausea and no vomiting  Complications: no  Cardiovascular status: blood pressure returned to baseline and hemodynamically stable  Respiratory status: acceptable, room air and spontaneous ventilation  Hydration status: euvolemic  Multimodal analgesia pain management approach

## 2020-11-30 NOTE — ED Notes (Signed)
Phone call made to floor and spoke with [facility] RN who will have Dahlia Client RN call ED when she is ready for the patient to come to room 266.     Apolinar Junes, RN  11/30/20 [phone]

## 2020-11-30 NOTE — Consults (Signed)
Vancomycin Dosing by Pharmacy - Initial Note   TODAY'S DATE:  11/30/2020  Patient name, age:  Mark Buchanan, 55 y.o.    Indication: SSTI, MRSA suspected.  Additional antimicrobials:  Zosyn 3.375 grams IV every 8 hours     Allergies:  Patient has no known allergies.   Actual Weight:    Wt Readings from Last 1 Encounters:   11/30/20 133 lb 9.6 oz (60.6 kg)     Labs/Ancillary Data  Estimated Creatinine Clearance: 56 mL/min (A) (based on SCr of 1.26 mg/dL (H)).  Recent Labs     11/29/20  2314   CREATININE 1.26*   BUN 13   WBC 14.1*     No results found for: PROCAL    Intake/Output Summary (Last 24 hours) at 11/30/2020 0944  Last data filed at 11/30/2020 0021  Gross per 24 hour   Intake 50 ml   Output --   Net 50 ml     Temp: 99.3 F    Recent vancomycin administrations                     vancomycin 1000 mg IVPB in 250 mL D5W addavial (mg) 1,000 mg New Bag 11/30/20 0149                  Culture Date / Source  /  Results  11/30/2020 / Blood #1 / in process    MRSA Nasal Swab: N/A. Non-respiratory infection.    PLAN   Initial dose of 1000 mg IV Vancomycin given on 11/30/2020 at 0149  Will order dose of 1250 mg IV Vancomycin every 24 hours to start 11/30/2020 at 1600 (estimates AUC=487 and trough of 13.8 mg/L)  Ensured BUN/sCr ordered at baseline and every 48 hours x at least 3 levels, then at least weekly.  Random vancomycin level ordered for 12/01/2020 @ 0600. Will use bayesian method for dosing.  This level will not be a trough.  Target AUC/MIC 400-500, with trough goal estimate of 10-15.      Vancomycin Target Concentration Parameters  Treatment  Population Target AUC/MIC Target Trough   Invasive MRSA Infection (bacteremia, pneumonia, meningitis, endocarditis, osteomyelitis)  Sepsis (undifferentiated) 400-600 N/A   Infection due to non-MRSA pathogen  Empiric treatment of non-invasive MRSA infection  (SSTI, UTI) <500 10-15 mg/L   CrCl < 29 mL/min  Rapidly fluctuating serum creatinine   AKI N/A < 15 mg/L     Renal replacement  therapy is dosed by levels, per hospital protocol.  Abbreviations  * Pauc: probability that AUC is >400 (efficacy); Pconc: probability that Ctrough is above 20 ?g/mL (toxicity); Tox: Probability of nephrotoxicity, based on Lodise et al. Clin Infect Dis 2009.    Thank you for the consult.  Pharmacy will continue to follow.    Simonne Come, PharmD

## 2020-11-30 NOTE — ED Notes (Signed)
Wife at cart side and updated on condition of patient, and medications given.     Paulene Floor, RN  11/30/20 [phone]

## 2020-11-30 NOTE — Op Note (Signed)
Operative Note      Patient: Mark Buchanan  Date of Birth: 1966/03/09  MRN: [id number]    Date of Procedure: 11/30/2020    Pre-Op Diagnosis: Abscess of arm, right [L02.413]    Post-Op Diagnosis: Same       Procedure(s):  IRRIGATION AND DRAINAGE OF RIGHT FOREARM    Surgeon(s):  Denny Peon, MD    Assistant:   * No surgical staff found *    Anesthesia: General    Estimated Blood Loss (mL): Minimal    Complications: None    Specimens:   ID Type Source Tests Collected by Time Destination   A : Right Arm Infection Tissue Arm SURGICAL PATHOLOGY, CULTURE, TISSUE Denny Peon, MD 11/30/2020 1928        Implants:  * No implants in log *      Drains:   [REMOVED] Closed/Suction Drain Left Other (Comment) Bulb 15 French (Removed)       Findings: Right forearm abscess of subcutaneous tissue with no obvious muscle involvement    Detailed Description of Procedure:   After informed consent was obtained the patient brought to the operating room where a general anesthetic was administered.  The volar mid forearm draining wound was incised proximal and distal.  Blunt dissection was carried down and purulent drainage was expressed.  Curette was used to debride subcutaneous fat and the fatty tissue was sent for cultures.  The wound was irrigated.  No extension or obvious involvement of the muscle.  Wound was then packed and a sterile dressing was placed.  Patient was awakened and brought to the recovery room.  There were no intraoperative or immediate postoperative complications.    Electronically signed by Denny Peon, MD on 11/30/2020 at 7:47 PM

## 2020-11-30 NOTE — ACP (Advance Care Planning) (Signed)
Advance Care Planning     Advance Care Planning Activator (Inpatient)  Conversation Note      Date of ACP Conversation: 11/30/2020     Conversation Conducted with: Patient with Decision Making Capacity    ACP Activator: [name], [name]        Health Care Decision Maker:     Current Designated Health Care Decision Maker:     Primary Decision Maker: Mark Buchanan, Mark Buchanan [phone]  Click here to complete Healthcare Decision Makers including section of the Healthcare Decision Maker Relationship (ie "Primary")  Today we documented Decision Maker(s) consistent with Legal Next of Kin hierarchy.    Care Preferences    Ventilation:  "If you were in your present state of health and suddenly became very ill and were unable to breathe on your own, what would your preference be about the use of a ventilator (breathing machine) if it were available to you?"      Would the patient desire the use of ventilator (breathing machine)?: yes    "If your health worsens and it becomes clear that your chance of recovery is unlikely, what would your preference be about the use of a ventilator (breathing machine) if it were available to you?"     Would the patient desire the use of ventilator (breathing machine)?: Yes      Resuscitation  "CPR works best to restart the heart when there is a sudden event, like a heart attack, in someone who is otherwise healthy. Unfortunately, CPR does not typically restart the heart for people who have serious health conditions or who are very sick."    "In the event your heart stopped as a result of an underlying serious health condition, would you want attempts to be made to restart your heart (answer "yes" for attempt to resuscitate) or would you prefer a natural death (answer "no" for do not attempt to resuscitate)?" yes       []  Yes   [x]  No   Educated Patient / Decision Maker regarding differences between Advance Directives and portable DNR orders.    Length of ACP Conversation in  minutes:  5  Conversation Outcomes:  [x]  ACP discussion completed  []  Existing advance directive reviewed with patient; no changes to patient's previously recorded wishes  []  New Advance Directive completed  []  Portable Do Not Rescitate prepared for Provider review and signature  []  POLST/POST/MOLST/MOST prepared for Provider review and signature      Follow-up plan:    []  Schedule follow-up conversation to continue planning  []  Referred individual to Provider for additional questions/concerns   []  Advised patient/agent/surrogate to review completed ACP document and update if needed with changes in condition, patient preferences or care setting    [x]  This note routed to one or more involved healthcare providers

## 2020-11-30 NOTE — Progress Notes (Signed)
Quality flow rounds held on 11/30/20     Mark Buchanan is admitted for  Cellulitis of arm.    Length of stay 0.     Education:    Needed Education: cellulitis, wound care, diet, follow up, meds      Do you have any questions regarding your plan of care while at the hospital? denies    Planned Disposition:               [x]   Home when able                []  Swing Bed                []  ECF/SNF               []  Other/TBD    Barriers to Discharge:    Can you afford your medications? yes   Do you have transportation to follow up appointments? Drives self and wife drives   Do you need any new equipment at home? Possible wound care supplies   Current equipment includes   denies    Do you have a living will or durable power of attorney for healthcare? denies               If yes do we have a copy on file? n/a    Do you or your family have any questions or concerns we haven't already discussed?  Denies    Lives with wife and plans to return home at discharge. Denies needs.   PCP is Dr. 

## 2020-11-30 NOTE — Consults (Signed)
Orthopedic Consult      CHIEF COMPLAINT: Right forearm pain    HISTORY OF PRESENT ILLNESS:      The patientis a 55 y.o. male  who presents with right forearm pain for 2 weeks.  He had a piece of metal from a grinder impaled his right forearm.  He reports he was able to remove this but since that time has had progressive pain and swelling in this location.  He was seen in the emergency room yesterday and left AMA.  He returned again today with worsening symptoms and a draining forearm.    PastMedical History:    Past Medical History:   Diagnosis Date   ??? Hyperlipidemia        Past Surgical History:  Past Surgical History:   Procedure Laterality Date   ??? CORONARY ANGIOPLASTY WITH STENT PLACEMENT     ??? EXPLORATION OF WOUND OF EXTREMITY Left 02/14/2016    WOUND EXPLORATION AND REVISION performed by Romie Levee, MD at [facility] OR       Medications Prior to Admission:   Current Facility-Administered Medications   Medication Dose Route Frequency Provider Last Rate Last Admin   ??? albuterol sulfate HFA 108 (90 Base) MCG/ACT inhaler 2 puff  2 puff Inhalation Q6H PRN Damita Lack, MD       ??? aspirin EC tablet 81 mg  81 mg Oral Daily Damita Lack, MD   81 mg at 11/30/20 0925   ??? atorvastatin (LIPITOR) tablet 20 mg  20 mg Oral Daily Damita Lack, MD   20 mg at 11/30/20 3151   ??? sodium chloride flush 0.9 % injection 5-40 mL  5-40 mL IntraVENous 2 times per day Damita Lack, MD   10 mL at 11/30/20 0926   ??? sodium chloride flush 0.9 % injection 5-40 mL  5-40 mL IntraVENous PRN Damita Lack, MD       ??? 0.9 % sodium chloride infusion   IntraVENous PRN Damita Lack, MD       ??? enoxaparin (LOVENOX) injection 40 mg  40 mg SubCUTAneous Daily Damita Lack, MD   40 mg at 11/30/20 0925   ??? ondansetron (ZOFRAN-ODT) disintegrating tablet 4 mg  4 mg Oral Q8H PRN Damita Lack, MD        Or   ??? ondansetron (ZOFRAN) injection 4 mg  4 mg IntraVENous Q6H PRN Damita Lack, MD       ??? polyethylene glycol (GLYCOLAX) packet  17 g  17 g Oral Daily PRN Damita Lack, MD       ??? acetaminophen (TYLENOL) tablet 650 mg  650 mg Oral Q6H PRN Damita Lack, MD        Or   ??? acetaminophen (TYLENOL) suppository 650 mg  650 mg Rectal Q6H PRN Damita Lack, MD       ??? 0.9 % sodium chloride infusion   IntraVENous Continuous Damita Lack, MD 75 mL/hr at 11/30/20 0930 New Bag at 11/30/20 0930   ??? piperacillin-tazobactam (ZOSYN) 3,375 mg in dextrose 5 % 50 mL IVPB extended infusion (mini-bag)  3,375 mg IntraVENous Q8H Damita Lack, MD 12.5 mL/hr at 11/30/20 1802 3,375 mg at 11/30/20 1802   ??? traMADol (ULTRAM) tablet 50 mg  50 mg Oral Q6H PRN Damita Lack, MD        Or   ??? traMADol (ULTRAM) tablet 100 mg  100 mg Oral Q6H PRN Damita Lack, MD   100 mg at 11/30/20 1602   ??? ketorolac (TORADOL) injection 15 mg  15 mg IntraVENous Q6H PRN Damita Lack, MD       ??? lactobacillus (CULTURELLE) capsule 1 capsule  1 capsule Oral BID WC Damita Lack, MD   1 capsule at 11/30/20 1603   ??? clopidogrel (PLAVIX) tablet 75 mg  75 mg Oral Daily Damita Lack, MD   75 mg at 11/30/20 1056   ??? vancomycin (VANCOCIN) 1,250 mg in dextrose 5 % 250 mL IVPB  1,250 mg IntraVENous Q24H Damita Lack, MD   Stopped at 11/30/20 1801         Allergies:  Patient has no known allergies.    Social History:   Social History     Tobacco Use   Smoking Status Former Smoker   ??? Packs/day: 1.00   ??? Types: Cigarettes   Smokeless Tobacco Never Used     Social History     Substance and Sexual Activity   Alcohol Use No     Social History     Substance and Sexual Activity   Drug Use No       Family History:  History reviewed. No pertinent family history.      REVIEW OF SYSTEMS:  Review of Systems        PHYSICAL EXAM:  Patient Vitals for the past 24 hrs:   BP Temp Temp src Pulse Resp SpO2 Height Weight   11/30/20 1632 -- -- -- -- 18 -- -- --   11/30/20 1602 (!) 100/51 98.1 ??F (36.7 ??C) Oral 68 18 99 % -- --   11/30/20 1230 107/60 97.9 ??F (36.6 ??C) Oral 73 18 95 % -- --    11/30/20 0952 -- -- -- -- -- -- 5\' 7"  (1.702 m) --   11/30/20 0930 -- -- -- -- -- 97 % -- --   11/30/20 0833 -- -- -- -- -- -- 5\' 7"  (1.702 m) 133 lb 9.6 oz (60.6 kg)   11/30/20 0815 102/65 97.8 ??F (36.6 ??C) Oral 65 18 96 % -- --   11/30/20 0625 100/61 98.8 ??F (37.1 ??C) Oral 64 -- 95 % -- --   11/29/20 2303 135/81 99.3 ??F (37.4 ??C) Oral (!) 109 20 98 % -- 139 lb 11.2 oz (63.4 kg)     Gen: alert and oriented  Head: normorcephalic, atraumatic  Neck: supple  Heart: RRR  Lungs: No audible wheezes  Abdomen: soft  Pelvis: stable  Extremity examination today of his right forearm reveals a 1 cm open wound over the volar aspect with purulent drainage.  Surrounding several centimeter area of erythema with tenderness to palpation.  Good finger range of motion.  Normal sensation in the fingertips.    DATA:  CBC:  Lab Results   Component Value Date    WBC 14.1 11/29/2020    HGB 13.8 11/29/2020    PLT 294 11/29/2020     BMP:    Lab Results   Component Value Date    NA 133 11/29/2020    K 4.3 11/29/2020    CL 94 11/29/2020    CO2 26 11/29/2020    BUN 13 11/29/2020    CREATININE 1.26 11/29/2020    CALCIUM 9.8 11/29/2020    GLUCOSE 103 11/29/2020     PT/INR:    Lab Results   Component Value Date    PROTIME 10.3 02/14/2016    INR 1.0 02/14/2016         Radiology: X-rays reviewed and show no obvious foreign body    ASSESSMENT:Principal Problem:  Cellulitis of arm  Active Problems:    Right arm cellulitis  Resolved Problems:    * No resolved hospital problems. *  Right arm abscess    PLAN:  1) irrigation and debridement right forearm        Denny Peon, MD

## 2020-11-30 NOTE — Plan of Care (Signed)
Problem: Discharge Planning  Goal: Discharge to home or other facility with appropriate resources  Outcome: Progressing     Problem: Pain  Goal: Verbalizes/displays adequate comfort level or baseline comfort level  Outcome: Progressing     Problem: Safety - Adult  Goal: Free from fall injury  Outcome: Progressing     Problem: ABCDS Injury Assessment  Goal: Absence of physical injury  Outcome: Progressing     Problem: Wound:  Goal: Will show signs of wound healing; wound closure and no evidence of infection  Description: Will show signs of wound healing; wound closure and no evidence of infection  Outcome: Progressing     Problem: Smoking cessation:  Goal: Ability to formulate a plan to maintain a tobacco-free life will be supported  Description: Ability to formulate a plan to maintain a tobacco-free life will be supported  Outcome: Progressing     Problem: Falls - Risk of:  Goal: Will remain free from falls  Description: Will remain free from falls  Outcome: Progressing     Problem: Skin/Tissue Integrity - Adult  Goal: Skin integrity remains intact  Outcome: Progressing  Goal: Incisions, wounds, or drain sites healing without S/S of infection  Outcome: Progressing  Goal: Oral mucous membranes remain intact  Outcome: Progressing     Problem: Musculoskeletal - Adult  Goal: Return mobility to safest level of function  Outcome: Progressing  Goal: Maintain proper alignment of affected body part  Outcome: Progressing  Goal: Return ADL status to a safe level of function  Outcome: Progressing     Problem: Infection - Adult  Goal: Absence of infection at discharge  Outcome: Progressing  Goal: Absence of infection during hospitalization  Outcome: Progressing  Goal: Absence of fever/infection during anticipated neutropenic period  Outcome: Progressing

## 2020-12-01 LAB — CBC WITH AUTO DIFFERENTIAL
Absolute Eos #: 0 10*3/uL (ref 0.0–0.4)
Absolute Lymph #: 0.7 10*3/uL — ABNORMAL LOW (ref 1.0–4.8)
Absolute Mono #: 0.1 10*3/uL (ref 0.0–1.0)
Basophils Absolute: 0 10*3/uL (ref 0.0–0.2)
Basophils: 0 % (ref 0–2)
Eosinophils %: 0 % (ref 0–5)
Hematocrit: 36.3 % — ABNORMAL LOW (ref 41–53)
Hemoglobin: 12 g/dL — ABNORMAL LOW (ref 13.5–17.5)
Lymphocytes: 9 % — ABNORMAL LOW (ref 13–44)
MCH: 29.7 pg (ref 26–34)
MCHC: 33.1 g/dL (ref 31–37)
MCV: 89.8 fL (ref 80–100)
Monocytes: 1 % — ABNORMAL LOW (ref 5–9)
Platelets: 273 10*3/uL (ref 140–450)
RBC: 4.05 m/uL — ABNORMAL LOW (ref 4.5–5.9)
RDW: 13.7 % (ref 12.1–15.2)
Seg Neutrophils: 90 % — ABNORMAL HIGH (ref 39–75)
Segs Absolute: 7.3 10*3/uL — ABNORMAL HIGH (ref 2.1–6.5)
WBC: 8.1 10*3/uL (ref 3.5–11.0)

## 2020-12-01 LAB — BASIC METABOLIC PANEL W/ REFLEX TO MG FOR LOW K
Anion Gap: 8 mmol/L — ABNORMAL LOW (ref 9–17)
BUN: 18 mg/dL (ref 6–20)
Bun/Cre Ratio: 14 (ref 9–20)
CO2: 26 mmol/L (ref 20–31)
Calcium: 9.3 mg/dL (ref 8.6–10.4)
Chloride: 103 mmol/L (ref 98–107)
Creatinine: 1.33 mg/dL — ABNORMAL HIGH (ref 0.70–1.20)
GFR African American: >60 mL/min (ref >60)
GFR Non-African American: 56 mL/min — ABNORMAL LOW (ref >60)
Glucose: 172 mg/dL — ABNORMAL HIGH (ref 70–99)
Potassium: 4.8 mmol/L (ref 3.7–5.3)
Sodium: 137 mmol/L (ref 135–144)

## 2020-12-01 LAB — VANCOMYCIN LEVEL, RANDOM
Vancomycin Random Time last dose: 1602
Vancomycin Rm: 11.9 ug/mL

## 2020-12-01 MED ORDER — NORMAL SALINE FLUSH 0.9 % IV SOLN
0.9 | Freq: Two times a day (BID) | INTRAVENOUS | Status: DC
Start: 2020-12-01 — End: 2020-12-02

## 2020-12-01 MED ORDER — VANCOMYCIN INTERMITTENT DOSING (PLACEHOLDER)
INTRAVENOUS | Status: DC
Start: 2020-12-01 — End: 2020-12-02

## 2020-12-01 MED ORDER — SODIUM CHLORIDE 0.9 % IV SOLN
0.9 % | INTRAVENOUS | Status: DC | PRN
Start: 2020-12-01 — End: 2020-12-02

## 2020-12-01 MED ORDER — NORMAL SALINE FLUSH 0.9 % IV SOLN
0.9 | INTRAVENOUS | Status: DC | PRN
Start: 2020-12-01 — End: 2020-12-02

## 2020-12-01 MED ORDER — CALCIUM CARBONATE ANTACID 500 MG PO CHEW
500 MG | Freq: Three times a day (TID) | ORAL | Status: DC | PRN
Start: 2020-12-01 — End: 2020-12-02
  Administered 2020-12-02: 04:00:00 500 mg via ORAL

## 2020-12-01 MED ORDER — FENTANYL CITRATE (PF) 100 MCG/2ML IJ SOLN
100 | INTRAMUSCULAR | Status: AC
Start: 2020-12-01 — End: 2020-11-30

## 2020-12-01 MED ORDER — HYDROCODONE-ACETAMINOPHEN 5-325 MG PO TABS: 1.0000 | ORAL_TABLET | Freq: Four times a day (QID) | ORAL | 0 refills | Status: DC | PRN

## 2020-12-01 MED FILL — KETOROLAC TROMETHAMINE 15 MG/ML IJ SOLN: 15 mg/mL | INTRAMUSCULAR | Qty: 1

## 2020-12-01 MED FILL — FENTANYL CITRATE (PF) 100 MCG/2ML IJ SOLN: 100 MCG/2ML | INTRAMUSCULAR | Qty: 2

## 2020-12-01 MED FILL — TYLENOL 325 MG PO TABS: 325 mg | ORAL | Qty: 2

## 2020-12-01 MED FILL — ASPIRIN LOW DOSE 81 MG PO TBEC: 81 mg | ORAL | Qty: 1

## 2020-12-01 MED FILL — TRAMADOL HCL 50 MG PO TABS: 50 mg | ORAL | Qty: 2

## 2020-12-01 MED FILL — VANCOMYCIN HCL 750 MG IV SOLR: 750 mg | INTRAVENOUS | Qty: 750

## 2020-12-01 MED FILL — CLOPIDOGREL BISULFATE 75 MG PO TABS: 75 mg | ORAL | Qty: 1

## 2020-12-01 MED FILL — CULTURELLE PO CAPS: ORAL | Qty: 1

## 2020-12-01 MED FILL — PIPERACILLIN SOD-TAZOBACTAM SO 3.375 (3-0.375) G IV SOLR: 3.375 (3-0.375) g | INTRAVENOUS | Qty: 3375

## 2020-12-01 MED FILL — ATORVASTATIN CALCIUM 20 MG PO TABS: 20 mg | ORAL | Qty: 1

## 2020-12-01 NOTE — Plan of Care (Signed)
Problem: Discharge Planning  Goal: Discharge to home or other facility with appropriate resources  Outcome: Progressing     Problem: Pain  Goal: Verbalizes/displays adequate comfort level or baseline comfort level  Outcome: Progressing     Problem: Safety - Adult  Goal: Free from fall injury  Outcome: Progressing     Problem: ABCDS Injury Assessment  Goal: Absence of physical injury  Recent Flowsheet Documentation  Taken 12/01/2020 0153 by Rosine Abe, RN  Absence of Physical Injury: Implement safety measures based on patient assessment     Problem: Wound:  Goal: Will show signs of wound healing; wound closure and no evidence of infection  Description: Will show signs of wound healing; wound closure and no evidence of infection  Outcome: Progressing     Problem: Falls - Risk of:  Goal: Will remain free from falls  Description: Will remain free from falls  Outcome: Progressing     Problem: Skin/Tissue Integrity - Adult  Goal: Skin integrity remains intact  Outcome: Progressing     Problem: Nutrition Deficit:  Goal: Optimize nutritional status  Outcome: Progressing

## 2020-12-01 NOTE — Progress Notes (Signed)
Hospitalist Progress Note  12/01/2020 7:31 AM  Subjective:   Admit Date: 11/29/2020  PCP: Damita Lack, MD    Interval History:     Patient states that he is doing better, pain in the right forearm improved.  Reports no nausea, vomiting, diarrhea.  Reports no cough, no shortness of      Diet: ADULT DIET; Regular  Medications:   Scheduled Meds:  ??? aspirin  81 mg Oral Daily   ??? atorvastatin  20 mg Oral Daily   ??? sodium chloride flush  5-40 mL IntraVENous 2 times per day   ??? piperacillin-tazobactam  3,375 mg IntraVENous Q8H   ??? lactobacillus  1 capsule Oral BID WC   ??? clopidogrel  75 mg Oral Daily   ??? vancomycin  1,250 mg IntraVENous Q24H   ??? sodium chloride flush  5-40 mL IntraVENous 2 times per day     Continuous Infusions:  ??? sodium chloride     ??? sodium chloride 75 mL/hr at 11/30/20 0932   ??? sodium chloride       PRN Medications: albuterol sulfate HFA, sodium chloride flush, sodium chloride, ondansetron **OR** ondansetron, polyethylene glycol, acetaminophen **OR** acetaminophen, traMADol **OR** traMADol, ketorolac, sodium chloride flush, sodium chloride    Objective:   Vitals: BP (!) 104/55    Pulse 60    Temp 98.2 ??F (36.8 ??C) (Oral)    Resp 16    Ht 5\' 7"  (1.702 m)    Wt 133 lb 9.6 oz (60.6 kg)    SpO2 98%    BMI 20.92 kg/m??   BMI: Body mass index is 20.92 kg/m??.    CBC:   Recent Labs     11/29/20  2314 12/01/20  0429   WBC 14.1* 8.1   HGB 13.8 12.0*   PLT 294 273     BMP:    Recent Labs     11/29/20  2314 12/01/20  0429   NA 133* 137   K 4.3 4.8   CL 94* 103   CO2 26 26   BUN 13 18   CREATININE 1.26* 1.33*   GLUCOSE 103* 172*       Physical Exam:    General Appearance: alert and oriented to person, place and time, in no acute distress  Cardiovascular: normal rate, regular rhythm, normal S1 and S2, no murmurs, rubs, clicks, or gallops, distal pulses intact  Pulmonary/Chest: clear to auscultation bilaterally- no wheezes, rales or rhonchi, normal air movement, no respiratory distress  Abdomen: soft, non-tender,  non-distended, normal bowel sounds  Extremities: no cyanosis, clubbing or edema  Skin: Right forearm dressed.  Neurological: alert, oriented, normal speech, no focal findings or movement disorder noted    Assessment and Plan:      ??  1.  Right forearm abscess/cellulitis  - s/p I&D on 6/1 by Dr. 8/1, wound cultures preliminary showing gram-positive cocci in pairs.    Patient on IV Vanco and Zosyn started 11/30/2020.  Appreciate Dr. 01/30/2021 for input.  Continue Pain management with as needed Tylenol, IV Toradol and p.o. tramadol  2.  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  Hyperlipidemia - on Lipitor  4.  PAD, s/p left to right femorofemoral bypass - continue on Plavix, aspirin, Lipitor, follows up with Dr. Dallas Schimke  5.  Tobacco dependence - patient was offered nicotine patch, however declined  6.  History of COPD    Patient continues to require inpatient admission related to need for IV antibiotics, wound care  Electronically signed by Damita Lack, MD on 12/01/2020 at 7:31 AM    Rounding Hospitalist

## 2020-12-02 LAB — CULTURE, WOUND

## 2020-12-02 LAB — SURGICAL PATHOLOGY REPORT

## 2020-12-02 MED ORDER — TRAMADOL HCL 50 MG PO TABS
50 MG | ORAL_TABLET | Freq: Three times a day (TID) | ORAL | 0 refills | Status: AC | PRN
Start: 2020-12-02 — End: 2020-12-05

## 2020-12-02 MED ORDER — DOXYCYCLINE HYCLATE 100 MG PO CAPS
100 MG | ORAL_CAPSULE | Freq: Two times a day (BID) | ORAL | 0 refills | Status: DC
Start: 2020-12-02 — End: 2020-12-09

## 2020-12-02 MED ORDER — CULTURELLE PO CAPS
ORAL_CAPSULE | Freq: Two times a day (BID) | ORAL | 0 refills | Status: AC
Start: 2020-12-02 — End: ?

## 2020-12-02 MED FILL — ATORVASTATIN CALCIUM 20 MG PO TABS: 20 mg | ORAL | Qty: 1

## 2020-12-02 MED FILL — CULTURELLE PO CAPS: ORAL | Qty: 1

## 2020-12-02 MED FILL — CLOPIDOGREL BISULFATE 75 MG PO TABS: 75 mg | ORAL | Qty: 1

## 2020-12-02 MED FILL — PIPERACILLIN SOD-TAZOBACTAM SO 3.375 (3-0.375) G IV SOLR: 3.375 (3-0.375) g | INTRAVENOUS | Qty: 3375

## 2020-12-02 MED FILL — VANCOMYCIN HCL 1 G IV SOLR: 1 g | INTRAVENOUS | Qty: 1250

## 2020-12-02 MED FILL — TRAMADOL HCL 50 MG PO TABS: 50 mg | ORAL | Qty: 2

## 2020-12-02 MED FILL — TYLENOL 325 MG PO TABS: 325 mg | ORAL | Qty: 2

## 2020-12-02 MED FILL — ANTACID CALCIUM 500 MG PO CHEW: 500 mg | ORAL | Qty: 1

## 2020-12-02 MED FILL — ASPIRIN LOW DOSE 81 MG PO TBEC: 81 mg | ORAL | Qty: 1

## 2020-12-02 NOTE — Progress Notes (Signed)
IV leaking, removed without incident.

## 2020-12-02 NOTE — Plan of Care (Signed)
Problem: Discharge Planning  Goal: Discharge to home or other facility with appropriate resources  12/02/2020 1852 by Henrene Hawking, RN  Outcome: Completed  12/02/2020 1008 by Henrene Hawking, RN  Outcome: Progressing     Problem: Pain  Goal: Verbalizes/displays adequate comfort level or baseline comfort level  12/02/2020 1852 by Henrene Hawking, RN  Outcome: Completed  12/02/2020 1008 by Henrene Hawking, RN  Outcome: Progressing  12/02/2020 0656 by Paulene Floor, RN  Outcome: Progressing     Problem: Safety - Adult  Goal: Free from fall injury  12/02/2020 1852 by Henrene Hawking, RN  Outcome: Completed  12/02/2020 1008 by Henrene Hawking, RN  Outcome: Progressing  12/02/2020 0656 by Paulene Floor, RN  Outcome: Progressing     Problem: ABCDS Injury Assessment  Goal: Absence of physical injury  12/02/2020 1852 by Henrene Hawking, RN  Outcome: Completed  12/02/2020 0656 by Paulene Floor, RN  Outcome: Progressing     Problem: Wound:  Goal: Will show signs of wound healing; wound closure and no evidence of infection  Description: Will show signs of wound healing; wound closure and no evidence of infection  Outcome: Completed     Problem: Smoking cessation:  Goal: Ability to formulate a plan to maintain a tobacco-free life will be supported  Description: Ability to formulate a plan to maintain a tobacco-free life will be supported  Outcome: Completed     Problem: Falls - Risk of:  Goal: Will remain free from falls  Description: Will remain free from falls  12/02/2020 1852 by Henrene Hawking, RN  Outcome: Completed  12/02/2020 0656 by Paulene Floor, RN  Outcome: Progressing     Problem: Skin/Tissue Integrity - Adult  Goal: Skin integrity remains intact  Outcome: Completed  Goal: Incisions, wounds, or drain sites healing without S/S of infection  Outcome: Completed  Goal: Oral mucous membranes remain intact  Outcome: Completed     Problem: Musculoskeletal - Adult  Goal: Return mobility to safest level of function  Outcome: Completed  Goal: Maintain proper  alignment of affected body part  Outcome: Completed  Goal: Return ADL status to a safe level of function  Outcome: Completed     Problem: Infection - Adult  Goal: Absence of infection at discharge  Outcome: Completed  Goal: Absence of infection during hospitalization  12/02/2020 1852 by Henrene Hawking, RN  Outcome: Completed  12/02/2020 0656 by Paulene Floor, RN  Outcome: Progressing  Goal: Absence of fever/infection during anticipated neutropenic period  12/02/2020 1852 by Henrene Hawking, RN  Outcome: Completed  12/02/2020 0656 by Paulene Floor, RN  Outcome: Progressing     Problem: Nutrition Deficit:  Goal: Optimize nutritional status  Outcome: Completed

## 2020-12-02 NOTE — Discharge Instructions (Signed)
Dressing Change Instructions: *Twice Daily*    Clean the area where you will work. Wash your hands with soap and water. Put on gloves.  Unwrap ace wraps. Remove old packing and irrigate wound with saline.   Remove gloves. Wash your hands with soap and water. Put on new gloves.  Lightly pat dry with dry dressing. Place new iodoform gauze packing using a sterile cotton swab. Cover with dry dressing.   Then wrap right arm with ace wraps- it does not need to be tight, it is meant to keep the dressing in place.     Please call Dr. Johnnette Litter office -or- [facility] with any concerns or questions. [phone]

## 2020-12-02 NOTE — Plan of Care (Signed)
Problem: Discharge Planning  Goal: Discharge to home or other facility with appropriate resources  Outcome: Progressing     Problem: Pain  Goal: Verbalizes/displays adequate comfort level or baseline comfort level  12/02/2020 1008 by Henrene Hawking, RN  Outcome: Progressing  12/02/2020 0656 by Paulene Floor, RN  Outcome: Progressing     Problem: Safety - Adult  Goal: Free from fall injury  12/02/2020 1008 by Henrene Hawking, RN  Outcome: Progressing  12/02/2020 0656 by Paulene Floor, RN  Outcome: Progressing     Problem: ABCDS Injury Assessment  Goal: Absence of physical injury  12/02/2020 0656 by Paulene Floor, RN  Outcome: Progressing     Problem: Falls - Risk of:  Goal: Will remain free from falls  Description: Will remain free from falls  12/02/2020 0656 by Paulene Floor, RN  Outcome: Progressing     Problem: Infection - Adult  Goal: Absence of infection during hospitalization  12/02/2020 0656 by Paulene Floor, RN  Outcome: Progressing  Goal: Absence of fever/infection during anticipated neutropenic period  12/02/2020 0656 by Paulene Floor, RN  Outcome: Progressing

## 2020-12-02 NOTE — Progress Notes (Signed)
Awaiting pt cultures for discharge planning if pt will need continued IV antibiotics or will be switched to orals. Nursing reports that wife will do dressing changes at home for pt. Pt had agreed to do outpatient IVs if needed. SW will continue following. Ulice Brilliant MSW LSW 12/02/2020

## 2020-12-02 NOTE — Progress Notes (Signed)
Department of Orthopedic Surgery        SUBJECTIVE  Doing well today.  Feels much better    OBJECTIVE  BP 126/88    Pulse 56    Temp 98 ??F (36.7 ??C) (Oral)    Resp 18    Ht 5\' 7"  (1.702 m)    Wt 133 lb 9.6 oz (60.6 kg)    SpO2 100%    BMI 20.92 kg/m??     Physical    Wound clean.  No drainage  Erythema and swelling almost completely resolved  D.NVI    LABS   Cultures +MRSA         ASSESSMENT AND PLAN      POD#2 doing well  May D/C home today from Ortho standpoint  Continue BID dressing changes  Doxycycline 100mg  BID  Follow up with Dr. June in [place] in 1 week    Dallas Schimke, MD  4:54 PM  12/02/20

## 2020-12-02 NOTE — Consults (Signed)
Vancomycin Dosing by Pharmacy - Daily Note   Vancomycin Therapy Day:  3  Indication: SSTI, MRSA suspected    Allergies:  Patient has no known allergies.   Actual Weight:    Wt Readings from Last 1 Encounters:   11/30/20 133 lb 9.6 oz (60.6 kg)       Labs/Ancillary Data  Estimated Creatinine Clearance: 53 mL/min (A) (based on SCr of 1.33 mg/dL (H)).    Recent Labs     11/29/20  2314 12/01/20  0429   CREATININE 1.26* 1.33*   BUN 13 18   WBC 14.1* 8.1     No results found for: PROCAL    Intake/Output Summary (Last 24 hours) at 12/02/2020 2956  Last data filed at 12/02/2020 2130  Gross per 24 hour   Intake 3117.16 ml   Output --   Net 3117.16 ml     Temp: afebrile    Culture Date / Source  /  Results  11/30/2020   / Blood #1   / no growth 2 days  11/30/2020   / Tissue   / MODERATE GRAM POSITIVE COCCI IN PAIRS Abnormal / culture pending  11/30/2020   / Wound   / STAPHYLOCOCCUS AUREUS LIGHT GROWTH Abnormal     Recent vancomycin administrations                     vancomycin (VANCOCIN) 1,250 mg in dextrose 5 % 250 mL IVPB (mg) 1,250 mg New Bag 12/01/20 1644     1,250 mg New Bag 11/30/20 1602    vancomycin 1000 mg IVPB in 250 mL D5W addavial (mg) 1,000 mg New Bag 11/30/20 0149                  MRSA Nasal Swab: N/A. Non-respiratory infection.Marland Kitchen    PLAN   Continue current 1250 mg IV Q24H dosing. (est AUC = 489, trough = 14.2)  Repeat random vancomycin level ordered on Monday, 12/05/2020 at 0600      Vancomycin Target Concentration Parameters  Treatment  Population Target AUC/MIC Target Trough   Invasive MRSA Infection (bacteremia, pneumonia, meningitis, endocarditis, osteomyelitis)  Sepsis (undifferentiated) 400-600 N/A   Infection due to non-MRSA pathogen  Empiric treatment of non-invasive MRSA infection  (SSTI, UTI) <500 10-15 mg/L   CrCl < 29 mL/min  Rapidly fluctuating serum creatinine   AKI N/A < 15 mg/L     Renal replacement therapy is dosed by levels, per hospital protocol.  Abbreviations  * Pauc: probability that AUC is >400  (efficacy); Pconc: probability that Ctrough is above 20 ?g/mL (toxicity); Tox: Probability of nephrotoxicity, based on Lodise et al. Clin Infect Dis 2009.    Thank you for the consult.  Pharmacy will continue to follow.  Simonne Come, PharmD

## 2020-12-02 NOTE — Progress Notes (Signed)
Patient updated on available sensitivity r/t wound cultures. Call to Dr. Dallas Schimke, awaiting return call.  Patient voices readiness to go home, and that his wife is able to complete his dressing changes as she has in the past with a previous injury.

## 2020-12-02 NOTE — Progress Notes (Signed)
Discharge instructions provided to patient and family at bedside. Verbalized understanding of follow up appointment, wound care, diet, activity, medications and reasons to return to ED/call physician. Advised to call back directly if there are further questions, or if these symptoms fail to improve as anticipated or worsen. All questions answered. Dressing supplies given to patient. Denies further needs.

## 2020-12-02 NOTE — Progress Notes (Signed)
Dr. Copeland at bedside.

## 2020-12-02 NOTE — Progress Notes (Signed)
Hospitalist Progress Note  12/02/2020 7:18 AM  Subjective:   Admit Date: 11/29/2020  PCP: Damita Lack, MD    Interval History:     Patient states that he is doing well.  Has pain in his right forearm but it is improving.  Night was uneventful.  Appetite is good.  Reports no nausea, vomiting, no diarrhea.  Vitals stable, afebrile    Diet: ADULT DIET; Regular  Medications:   Scheduled Meds:  ??? vancomycin (VANCOCIN) intermittent dosing Leisure centre manager)   Other RX Placeholder   ??? aspirin  81 mg Oral Daily   ??? atorvastatin  20 mg Oral Daily   ??? sodium chloride flush  5-40 mL IntraVENous 2 times per day   ??? piperacillin-tazobactam  3,375 mg IntraVENous Q8H   ??? lactobacillus  1 capsule Oral BID WC   ??? clopidogrel  75 mg Oral Daily   ??? vancomycin  1,250 mg IntraVENous Q24H   ??? sodium chloride flush  5-40 mL IntraVENous 2 times per day     Continuous Infusions:  ??? sodium chloride     ??? sodium chloride 75 mL/hr at 12/02/20 0538   ??? sodium chloride       PRN Medications: calcium carbonate, albuterol sulfate HFA, sodium chloride flush, sodium chloride, ondansetron **OR** ondansetron, polyethylene glycol, acetaminophen **OR** acetaminophen, traMADol **OR** traMADol, ketorolac, sodium chloride flush, sodium chloride    Objective:   Vitals: BP 115/62    Pulse 61    Temp 98 ??F (36.7 ??C) (Oral)    Resp 18    Ht 5\' 7"  (1.702 m)    Wt 133 lb 9.6 oz (60.6 kg)    SpO2 97%    BMI 20.92 kg/m??   BMI: Body mass index is 20.92 kg/m??.    CBC:   Recent Labs     11/29/20  2314 12/01/20  0429   WBC 14.1* 8.1   HGB 13.8 12.0*   PLT 294 273     BMP:    Recent Labs     11/29/20  2314 12/01/20  0429   NA 133* 137   K 4.3 4.8   CL 94* 103   CO2 26 26   BUN 13 18   CREATININE 1.26* 1.33*   GLUCOSE 103* 172*       Physical Exam:    ??  General Appearance: alert and oriented to person, place and time, in no acute distress  Cardiovascular: normal rate, regular rhythm, normal S1 and S2, no murmurs, rubs, clicks, or gallops, distal pulses  intact  Pulmonary/Chest: clear to auscultation bilaterally- no wheezes, rales or rhonchi, normal air movement, no respiratory distress  Abdomen: soft, non-tender, non-distended, normal bowel sounds  Extremities: no cyanosis, clubbing or edema  Skin:??Right forearm dressed.  Neurological:??alert, oriented, normal speech, no focal findings or movement disorder noted    Assessment and Plan:         1.????Right forearm abscess/cellulitis  - s/p I&D on 6/1 by Dr. 8/1, wound cultures preliminary showing gram-positive cocci in pairs.  Await final results.   Continue on IV Vanco and Zosyn started 11/30/2020.    Appreciate Dr. 01/30/2021 for input.  Continue??Pain management with as needed Tylenol, IV Toradol and p.o. tramadol  2.????CKD stage 2??at baseline  3.????Hyperlipidemia - on Lipitor  4.????PAD,??s/p left to right femorofemoral bypass??-??continue on Plavix, aspirin, Lipitor, follows up with Dr. Dallas Schimke  5.????Tobacco dependence -  offered nicotine patch, however patient declined  6.????History of COPD  ??  Patient continues to require  inpatient admission related to need for IV antibiotics, wound care        Electronically signed by Damita Lack, MD on 12/02/2020 at 7:18 AM    Rounding Hospitalist

## 2020-12-02 NOTE — Plan of Care (Signed)
Problem: Pain  Goal: Verbalizes/displays adequate comfort level or baseline comfort level  Outcome: Progressing     Problem: Safety - Adult  Goal: Free from fall injury  Outcome: Progressing     Problem: ABCDS Injury Assessment  Goal: Absence of physical injury  Outcome: Progressing     Problem: Falls - Risk of:  Goal: Will remain free from falls  Description: Will remain free from falls  Outcome: Progressing     Problem: Infection - Adult  Goal: Absence of infection during hospitalization  Outcome: Progressing  Goal: Absence of fever/infection during anticipated neutropenic period  Outcome: Progressing

## 2020-12-02 NOTE — Progress Notes (Signed)
Dr. Dallas Schimke calls in, okay to discharge patient with PO ATB, and wife to complete dressing changes.

## 2020-12-02 NOTE — Discharge Summary (Signed)
Hospitalist Discharge Summary    Patient:  Mark Buchanan  Date of Birth: 1966-05-05    MRN: [id number]   Acct: [id number]    Primary Care Physician: Damita Lack, MD    Admit date:  11/29/2020    Discharge date:  12/02/2020       Discharge Diagnoses:     1. Right forearm cellulitis/abscess  2.CKD stage 2  3. Hyperlipidemia  4. PAD, s/p left to right fem-fem bypass  5. COPD     Discharge Medications:         Medication List      START taking these medications    doxycycline hyclate 100 mg capsule  Commonly known as: VIBRAMYCIN  Take 1 capsule by mouth 2 times daily for 7 days     lactobacillus capsule  Take 1 capsule by mouth 2 times daily (with meals)  Start taking on: December 03, 2020     traMADol 50 MG tablet  Commonly known as: ULTRAM  Take 1 tablet by mouth every 8 hours as needed for Pain for up to 3 days.        CONTINUE taking these medications    albuterol sulfate HFA 108 (90 Base) MCG/ACT inhaler  Commonly known as: Proventil HFA  Inhale 2 puffs into the lungs every 6 hours as needed for Wheezing     aspirin 81 MG tablet     atorvastatin 20 MG tablet  Commonly known as: LIPITOR     clopidogrel 75 MG tablet  Commonly known as: PLAVIX        STOP taking these medications    clindamycin 300 MG capsule  Commonly known as: CLEOCIN           Where to Get Your Medications      These medications were sent to [facility] [place], [place] - [address] - [place] [phone] Carmon Ginsberg [phone]  [address] [place] [address]    Phone: [phone]   ?? doxycycline hyclate 100 mg capsule  ?? lactobacillus capsule  ?? traMADol 50 MG tablet         Diet:  ADULT DIET; Regular    Activity:  Activity as tolerated (Patient may move about with assist as indicated or with supervision.)    Follow-up:  in 1 weeks with Damita Lack, MD    Consultants:  Orthopedic surgery Dr. Dallas Schimke     Physical Exam:    Vitals:  Patient Vitals for the past 24 hrs:   BP Temp Temp src Pulse Resp SpO2   12/02/20 1735 -- -- -- -- --  97 %   12/02/20 1506 -- -- -- -- 18 --   12/02/20 1436 -- -- -- -- 18 --   12/02/20 1430 126/88 98 ??F (36.7 ??C) Oral -- 18 --   12/02/20 0940 -- -- -- -- -- 96 %   12/02/20 0823 -- -- -- -- 18 --   12/02/20 0753 -- -- -- -- 16 --   12/02/20 0745 120/61 98.1 ??F (36.7 ??C) Oral 56 16 100 %   12/02/20 0116 -- -- -- -- 18 97 %   12/02/20 0100 115/62 98 ??F (36.7 ??C) Oral 61 18 97 %   12/02/20 0043 -- -- -- -- 18 --       General Appearance: alert and oriented to person, place and time, in no acute distress  Cardiovascular: normal rate, regular rhythm, normal S1 and S2, no murmurs, rubs,  clicks, or gallops, distal pulses intact  Pulmonary/Chest: clear to auscultation bilaterally- no wheezes, rales or rhonchi, normal air movement, no respiratory distress  Abdomen: soft, non-tender, non-distended, normal bowel sounds  Extremities: no cyanosis, clubbing or edema  Skin:??Right forearm??dressed.  Neurological:??alert, oriented, normal speech, no focal findings or movement disorder noted    Hospital Course:      The patient is a 55 y.o. male who presented to the emergency room complaining of right arm pain, swelling, redness started couple of days ago.  The patient states that about 2 weeks ago he got poked with a piece of wire of a grinder.  He was able to remove the wire but since then has been having some redness and drainage on his right forearm.  He did not attempt to treat it and last couple of days symptoms progressively worsened.  He developed severe sharp, squeezing pain, constant.  He noticed yellowish drainage at the site of puncture.  Reports no fevers, no chills, no nausea or vomiting.  Work-up in the emergency room revealed low-grade fever 99.3, heart rate 109, blood pressure 135/81.  O2 sats 98% on room air.  BMP revealed sodium 133, potassium 4.3, chloride 94, CO2 26, BUN 13 and creatinine 1.26.  WBC elevated 14.1, H&H normal, platelets 294.  Urine tox screen negative.  COVID was negative.  UA negative.  X-ray of  the right radius/ulna 2 views revealed soft tissue swelling without radiopaque foreign body or bony erosive changes.   Patient was started on IV vancomycin and Zosyn.  Orthopedic surgeon Dr. Dallas Schimke was contacted by ER physician.    Due to the extent of patient infection and possible abscess he was deemed appropriate for admission  ??  1.????Right forearm??abscess/cellulitis????- s/p I&D on 6/1??by Dr. Dallas Schimke, wound cultures preliminary showing gram-positive cocci in pairs. treated with  IV Vanco and Zosyn.Dr. Dallas Schimke felt the patient could be discharged with Doxycycline, wound care at home ( his wife was comfortable with doing dressing changes)  2.????CKD stage 2??at baseline  3.????Hyperlipidemia - continueLipitor  4.????PAD,??s/p left to right femorofemoral bypass??-??continue on Plavix, aspirin, Lipitor, follows up with Dr. Voncille Lo  5.????Tobacco dependence   6.????History of COPD    Disposition: home    Condition: Stable    Time Spent: 32 minutes      Electronically signed by Damita Lack, MD on 12/02/2020 at 10:02 PM  Discharging Hospitalist

## 2020-12-03 LAB — EKG 12-LEAD
Atrial Rate: 60 {beats}/min
P Axis: 60 degrees
P-R Interval: 134 ms
Q-T Interval: 400 ms
QRS Duration: 72 ms
QTc Calculation (Bazett): 400 ms
R Axis: 66 degrees
T Axis: 74 degrees
Ventricular Rate: 60 {beats}/min

## 2020-12-04 LAB — CULTURE, TISSUE: Culture: NORMAL

## 2020-12-05 NOTE — Care Coordination-Inpatient (Signed)
Care Transitions Outreach Attempt #1    Call within 2 business days of discharge: Yes   Attempted to reach patient for transitions of care follow up. Unable to reach patient. No VM set up and No HIPAA on file.      Patient: Mark Buchanan Patient DOB: [birth year] MRN: [id number]    Last Discharge [facility]       Complaint Diagnosis Description Type Department Provider    11/29/20 Arm Injury Right forearm cellulitis ... ED to Hosp-Admission (Discharged) (ADMITTED) [facility] Damita Lack, MD; Veselin Dimi...    11/29/20 Arm Pain Right arm cellulitis ED (AMA) [facility] ED Deanna Artis, MD            Was this an external facility discharge? No Discharge Facility: [facility]    Noted following upcoming appointments from discharge chart review:   [facility] follow up appointment(s):   Future Appointments   Date Time Provider Department Center   12/09/2020  1:00 PM Damita Lack, MD [id number]     Odie Sera, LPN  [facility]/ Care Transition Nurse  [phone]

## 2020-12-06 LAB — CULTURE, BLOOD 1: Culture: NO GROWTH

## 2020-12-06 NOTE — Care Coordination-Inpatient (Signed)
Care Transitions Outreach Attempt #2 final    Call within 2 business days of discharge: Yes   Attempted to reach patient for transitions of care follow up. Unable to reach patient. No voice mail set up.  Final attempt, episode resolved.      Patient: Mark Buchanan Patient DOB: Aug 24, 1965 MRN: [id number]    Last Discharge [facility]       Complaint Diagnosis Description Type Department Provider    11/29/20 Arm Injury Right forearm cellulitis ... ED to Hosp-Admission (Discharged) (ADMITTED) [facility] Damita Lack, MD; Veselin Dimi...    11/29/20 Arm Pain Right arm cellulitis ED (AMA) [facility] ED Deanna Artis, MD            Was this an external facility discharge? No Discharge Facility: [facility]    Noted following upcoming appointments from discharge chart review:   [facility] follow up appointment(s):   Future Appointments   Date Time Provider Department Center   12/09/2020  1:00 PM Damita Lack, MD [id number]

## 2020-12-09 ENCOUNTER — Ambulatory Visit
Admit: 2020-12-09 | Discharge: 2020-12-09 | Payer: BLUE CROSS/BLUE SHIELD | Attending: Internal Medicine | Primary: Internal Medicine

## 2020-12-09 DIAGNOSIS — L02413 Cutaneous abscess of right upper limb: Secondary | ICD-10-CM

## 2020-12-09 MED ORDER — LOSARTAN POTASSIUM 25 MG PO TABS
25 MG | ORAL_TABLET | Freq: Every day | ORAL | 1 refills | Status: AC
Start: 2020-12-09 — End: ?

## 2020-12-09 MED ORDER — MUPIROCIN CALCIUM 2 % EX CREA
2 % | CUTANEOUS | 0 refills | Status: AC
Start: 2020-12-09 — End: 2021-01-08

## 2020-12-09 NOTE — Progress Notes (Signed)
Post-Discharge Transitional Care  Follow Up      Mark Buchanan   Date of Birth:  Sep 22, 1965    Date of Office Visit:  12/09/2020  Date of Hospital Admission: 11/29/20  Date of Hospital Discharge: 12/02/20  Risk of hospital readmission (high >=14%. Medium >=10%) :Readmission Risk Score: 9 ( )      Care management risk score Rising risk (score 2-5) and Complex Care (Scores >=6): 1     Non face to face  following discharge, date last encounter closed (first attempt may have been earlier): 12/06/2020 10:43 AM    Call initiated 2 business days of discharge: Yes    ASSESSMENT/PLAN:   1.Abscess of right arm  Well-healing incision wound, continue dressing changes.  Prescribed topical Bactroban to prevent infection of the wound.  Patient completed doxycycline course.  -     mupirocin (BACTROBAN) 2 % cream; Apply 3 times daily., Disp-15 g, R-0, Normal  2.Cellulitis of right upper extremity  Completed doxycycline course  3.Primary hypertension  Blood pressure elevated.  Patient started on losartan.  Follow-up in 3 months  -     losartan (COZAAR) 25 MG tablet; Take 1 tablet by mouth daily, Disp-90 tablet, R-1Normal  4.Claudication in peripheral vascular disease (HCC)  Follows up with vascular surgeon in [facility] discharge follow-up  -     PR DISCHARGE MEDS RECONCILED W/ CURRENT OUTPATIENT MED LIST      Medical Decision Making: moderate complexity  Return in about 6 months (around 06/10/2021) for HTN, hyperlipidemia.           Subjective:   HPI:  Follow up of Hospital problems/diagnosis(es):    Inpatient course: Discharge summary reviewed- see chart.    Interval history/Current status:     Patient Active Problem List   Diagnosis   ??? Nicotine addiction   ??? GERD (gastroesophageal reflux disease)   ??? Claudication in peripheral vascular disease (HCC)   ??? Arthralgia of right hip   ??? Need for pneumococcal vaccination   ??? Need for Tdap vaccination   ??? ASVD (arteriosclerotic vascular disease)   ??? Claudication [facility])   ???  Essential hypertension   ??? Cellulitis of arm   ??? Right arm cellulitis       Medications listed as ordered at the time of discharge from hospital     Medication List          Accurate as of December 09, 2020  1:50 PM. If you have any questions, ask your nurse or doctor.            START taking these medications    losartan 25 MG tablet  Commonly known as: COZAAR  Take 1 tablet by mouth daily  Started by: Damita Lack, MD     mupirocin 2 % cream  Commonly known as: Bactroban  Apply 3 times daily.  Started by: Damita Lack, MD        CHANGE how you take these medications    doxycycline hyclate 100 MG tablet  Commonly known as: VIBRA-TABS  What changed: Another medication with the same name was removed. Continue taking this medication, and follow the directions you see here.  Changed by: Damita Lack, MD        CONTINUE taking these medications    albuterol sulfate HFA 108 (90 Base) MCG/ACT inhaler  Commonly known as: Proventil HFA  Inhale 2 puffs into the lungs every 6 hours as needed for Wheezing     *  aspirin 81 MG tablet     * Aspirin Low Dose 81 MG EC tablet  Generic drug: aspirin     atorvastatin 20 MG tablet  Commonly known as: LIPITOR     clopidogrel 75 MG tablet  Commonly known as: PLAVIX     lactobacillus capsule  Take 1 capsule by mouth 2 times daily (with meals)         * This list has 2 medication(s) that are the same as other medications prescribed for you. Read the directions carefully, and ask your doctor or other care provider to review them with you.               Where to Get Your Medications      These medications were sent to [facility] [place], [place] - [address] - [place] [phone] Carmon Ginsberg [phone]  [address] [place] [address]    Phone: [phone]   ?? losartan 25 MG tablet  ?? mupirocin 2 % cream           Medications marked "taking" at this time  Outpatient Medications Marked as Taking for the 12/09/20 encounter (Office Visit) with Damita Lack, MD   Medication Sig  Dispense Refill   ??? doxycycline hyclate (VIBRA-TABS) 100 MG tablet TAKE 1 TABLET BY MOUTH TWICE DAILY FOR 14 DAYS     ??? mupirocin (BACTROBAN) 2 % cream Apply 3 times daily. 15 g 0   ??? losartan (COZAAR) 25 MG tablet Take 1 tablet by mouth daily 90 tablet 1   ??? lactobacillus (CULTURELLE) capsule Take 1 capsule by mouth 2 times daily (with meals) 60 capsule 0   ??? clopidogrel (PLAVIX) 75 MG tablet Take 75 mg by mouth daily     ??? aspirin 81 MG tablet Take 81 mg by mouth daily     ??? atorvastatin (LIPITOR) 20 MG tablet Take 20 mg by mouth daily          Medications patient taking as of now reconciled against medications ordered at time of hospital discharge: Yes    A comprehensive review of systems was negative except for what was noted in the HPI.    Objective:    BP (!) 142/92    Pulse 80    Resp 18    Wt 135 lb (61.2 kg)    SpO2 94%    BMI 21.14 kg/m??   General Appearance: alert and oriented to person, place and time, well developed and well- nourished, in no acute distress  Skin: warm and dry, right forearm with well healing incision site from recent I&D.  Wound about 2 inches long, with healthy granulation, no drainage.    Head: normocephalic and atraumatic  Eyes: pupils equal, round, and reactive to light, extraocular eye movements intact, conjunctivae normal  ENT: tympanic membrane, external ear and ear canal normal bilaterally, nose without deformity, nasal mucosa and turbinates normal without polyps  Neck: supple and non-tender without mass, no cervical lymphadenopathy  Pulmonary/Chest: clear to auscultation bilaterally, no wheezes, rales or rhonchi, normal air movement, no respiratory distress  Cardiovascular: normal rate, regular rhythm, normal S1 and S2, no murmurs, rubs, clicks, or gallops, distal pulses intact  Abdomen: soft, non-tender, non-distended, normal bowel sounds  Extremities: no cyanosis, clubbing or edema  Musculoskeletal: normal range of motion, no joint swelling, deformity or  tenderness  Neurologic:no cranial nerve deficit, gait, coordination and speech normal      An electronic signature was used  to authenticate this note.  --Damita Lack, MD

## 2020-12-09 NOTE — Patient Instructions (Signed)
Print Now      ?   LOS   Follow-up   Charge Capture    Level of Service       Follow-up       Return in: 2 Weeks   4 Weeks   3 Months   6 Months   1 Years   2 Months       Days   Weeks   Months   Years     Return on: Approximately   PRN    For:

## 2020-12-13 DIAGNOSIS — E1169 Type 2 diabetes mellitus with other specified complication: Secondary | ICD-10-CM | POA: Diagnosis not present

## 2020-12-13 DIAGNOSIS — I10 Essential (primary) hypertension: Secondary | ICD-10-CM | POA: Diagnosis not present

## 2020-12-15 ENCOUNTER — Other Ambulatory Visit: Payer: Self-pay | Admitting: Orthopedic Surgery

## 2020-12-15 DIAGNOSIS — G894 Chronic pain syndrome: Secondary | ICD-10-CM

## 2020-12-15 NOTE — Telephone Encounter (Signed)
Patient called for refill: HYDROcodone-acetaminophen (NORCO/VICODIN) 5-325 MG tablet 56 tablet   - [place]     - reminded patient of office policy of requesting refills by noon on Thursdays

## 2020-12-16 MED ORDER — HYDROCODONE-ACETAMINOPHEN 5-325 MG PO TABS: 1.0000 | ORAL_TABLET | Freq: Four times a day (QID) | ORAL | 0 refills | Status: DC | PRN

## 2020-12-26 ENCOUNTER — Ambulatory Visit (INDEPENDENT_AMBULATORY_CARE_PROVIDER_SITE_OTHER): Payer: Medicare Other | Admitting: Orthopedic Surgery

## 2020-12-26 ENCOUNTER — Ambulatory Visit: Payer: Medicare Other

## 2020-12-26 ENCOUNTER — Encounter: Payer: Self-pay | Admitting: Orthopedic Surgery

## 2020-12-26 ENCOUNTER — Other Ambulatory Visit: Payer: Self-pay

## 2020-12-26 VITALS — BP 150/85 | HR 74 | Ht 68.0 in | Wt 262.1 lb

## 2020-12-26 DIAGNOSIS — M25511 Pain in right shoulder: Secondary | ICD-10-CM | POA: Diagnosis not present

## 2020-12-26 DIAGNOSIS — G8929 Other chronic pain: Secondary | ICD-10-CM

## 2020-12-26 DIAGNOSIS — M1711 Unilateral primary osteoarthritis, right knee: Secondary | ICD-10-CM | POA: Diagnosis not present

## 2020-12-26 MED ORDER — BUPIVACAINE-MELOXICAM ER 200-6 MG/7ML IJ SOLN: 400.0000 mg | Freq: Once | INTRAMUSCULAR | Status: DC

## 2020-12-26 NOTE — Progress Notes (Signed)
Established patient   Chief Complaint  Patient presents with   Knee Pain    R/ it hurts after sitting for a long period of time   Shoulder Pain    R/ it bothers me off and on.   55 year old male pain hydrocodone Is now presents with increasing pain right knee considering knee replacement  Stiffness after sitting for long time, for numbness and ligamenta flava associated also decreased range of motion  His right shoulder has been painful for a long time but he has noticed that overall he cannot hold off all with his grandchildren secondary to pain on the right shoulder.  He also notices pain at rest and pain at night  Physical Exam Constitutional:      General: He is not in acute distress.    Appearance: He is well-developed.     Comments: Well developed, well nourished Normal grooming and hygiene     Cardiovascular:     Comments: No peripheral edema Musculoskeletal:     Comments: Right knee has normal skin with varus alignment medial joint line tenderness small effusion slight decrease in extension flexion with stable ligaments  Skin:    General: Skin is warm and dry.  Neurological:     Mental Status: He is alert and oriented to person, place, and time.     Sensory: No sensory deficit.     Coordination: Coordination normal.     Gait: Gait abnormal.     Deep Tendon Reflexes: Reflexes are normal and symmetric.  Psychiatric:        Mood and Affect: Mood normal.        Behavior: Behavior normal.        Thought Content: Thought content normal.        Judgment: Judgment normal.     Comments: Affect normal    Imaging right shoulder moderate OA   Jan 2022 xrays show varus right knee mild deformity with medial narrowing severe and mod osteophytes   Encounter Diagnoses  Name Primary?   Chronic right shoulder pain Yes   Unilateral primary osteoarthritis, right knee    The procedure has been fully reviewed with the patient; The risks and benefits of surgery have been discussed  and explained and understood. Alternative treatment has also been reviewed, questions were encouraged and answered. The postoperative plan is also been reviewed. RIGHT TKA IN SEPT AND POSS LEFT 8 WEEKS AFTER

## 2020-12-29 DIAGNOSIS — K219 Gastro-esophageal reflux disease without esophagitis: Secondary | ICD-10-CM | POA: Diagnosis not present

## 2020-12-29 DIAGNOSIS — M1712 Unilateral primary osteoarthritis, left knee: Secondary | ICD-10-CM | POA: Diagnosis not present

## 2020-12-29 DIAGNOSIS — I10 Essential (primary) hypertension: Secondary | ICD-10-CM | POA: Diagnosis not present

## 2020-12-29 DIAGNOSIS — E1165 Type 2 diabetes mellitus with hyperglycemia: Secondary | ICD-10-CM | POA: Diagnosis not present

## 2021-01-03 ENCOUNTER — Other Ambulatory Visit: Payer: Self-pay | Admitting: Orthopedic Surgery

## 2021-01-03 DIAGNOSIS — G894 Chronic pain syndrome: Secondary | ICD-10-CM

## 2021-01-03 NOTE — Telephone Encounter (Signed)
Patient requests refill HYDROcodone-acetaminophen (NORCO/VICODIN) 5-325 MG tablet 56 tablet    [facility], [address], [place]

## 2021-01-04 MED ORDER — HYDROCODONE-ACETAMINOPHEN 5-325 MG PO TABS: 1.0000 | ORAL_TABLET | Freq: Four times a day (QID) | ORAL | 0 refills | Status: DC | PRN

## 2021-01-10 ENCOUNTER — Encounter: Payer: Self-pay | Admitting: Nutrition

## 2021-01-10 ENCOUNTER — Encounter: Payer: Medicare Other | Attending: Family Medicine | Admitting: Nutrition

## 2021-01-10 VITALS — Ht 68.0 in | Wt 262.0 lb

## 2021-01-10 DIAGNOSIS — E1165 Type 2 diabetes mellitus with hyperglycemia: Secondary | ICD-10-CM | POA: Insufficient documentation

## 2021-01-10 DIAGNOSIS — E118 Type 2 diabetes mellitus with unspecified complications: Secondary | ICD-10-CM | POA: Diagnosis not present

## 2021-01-10 DIAGNOSIS — E669 Obesity, unspecified: Secondary | ICD-10-CM | POA: Insufficient documentation

## 2021-01-10 DIAGNOSIS — I10 Essential (primary) hypertension: Secondary | ICD-10-CM | POA: Diagnosis not present

## 2021-01-10 DIAGNOSIS — IMO0002 Reserved for concepts with insufficient information to code with codable children: Secondary | ICD-10-CM

## 2021-01-10 NOTE — Patient Instructions (Signed)
Goals  Focus on more fresh fruits and vegetables Keep drinking gallon of water per day Test BS a few times a week instead of daily. Cut out snacks and processed foods. 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 Eat meals on time.

## 2021-01-10 NOTE — Progress Notes (Signed)
  Medical Nutrition Therapy:  Appt start time:  0930 and time: 1000  Assessment:  Primary concerns today: Diabetes and Obesity. Dx Dm x 3 years. Follow up  Wt stable. Still working with mowing yards. He has been eating more fruit, vegetables and cut out eating after 7 pm.  Scheduled to have knee surgery in September with right knee and then will do left knee in future. BS are 85-120's. Doing very well. Still taking Janumet Gained 11 lbs Hasn't been walking due to knee issues. Scheduled to have surgery soon. A1C down to 5.8%. Tcho 92 mg/dl, HDL 34 Low mg/dl and TG 45 and LDL 45.  BS doing much better. Feels better.  Preferred Learning Style: Auditory Visual-limited reading ability but likes to see things to learn Hands on  Learning Readiness:  Ready Change in progress   MEDICATIONS:    DIETARY INTAKE:  24-hr recall:  B (   8 am AM):  Omelet and fruit, water Lunch Pork rib, slaw and salad, water Dinner: Pork and beans, slaw and cornbread, water Usual physical activity: walks some.  Estimated energy needs: 1800 calories 200g carbohydrates 135g protein 50 g fat  Progress Towards Goal(s):  In progress.   Nutritional Diagnosis:  NB-1.1 Food and nutrition-related knowledge deficit As related to DIabetes Type 2.  As evidenced by A1C > 6.5%.    Intervention:  Nutrition and Diabetes education provided on My Plate, CHO counting, meal planning, portion sizes, timing of meals, avoiding snacks between meals unless having a low blood sugar, target ranges for A1C and blood sugars, signs/symptoms and treatment of hyper/hypoglycemia, monitoring blood sugars, taking medications as prescribed, benefits of exercising 30 minutes per day and prevention of complications of DM.  Goals  Focus on more fresh fruits and vegetables Keep drinking gallon of water per day Test BS a few times a week instead of daily. Cut out snacks and processed foods. 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 Eat meals on time. Teaching Method Utilized:  Visual Auditory Hands on  Handouts given during visit include: The Plate Method  Visual plate ideas   Barriers to learning/adherence to lifestyle change: low reading and writing ability  Demonstrated degree of understanding via:  Teach Back   Monitoring/Evaluation:  Dietary intake, exercise, , and body weight in 6 month(s).

## 2021-01-11 DIAGNOSIS — L110 Acquired keratosis follicularis: Secondary | ICD-10-CM | POA: Diagnosis not present

## 2021-01-11 DIAGNOSIS — M79675 Pain in left toe(s): Secondary | ICD-10-CM | POA: Diagnosis not present

## 2021-01-11 DIAGNOSIS — E1140 Type 2 diabetes mellitus with diabetic neuropathy, unspecified: Secondary | ICD-10-CM | POA: Diagnosis not present

## 2021-01-11 DIAGNOSIS — M79674 Pain in right toe(s): Secondary | ICD-10-CM | POA: Diagnosis not present

## 2021-01-11 DIAGNOSIS — M79672 Pain in left foot: Secondary | ICD-10-CM | POA: Diagnosis not present

## 2021-01-11 DIAGNOSIS — M79671 Pain in right foot: Secondary | ICD-10-CM | POA: Diagnosis not present

## 2021-01-16 DIAGNOSIS — H401131 Primary open-angle glaucoma, bilateral, mild stage: Secondary | ICD-10-CM | POA: Diagnosis not present

## 2021-01-16 DIAGNOSIS — H25813 Combined forms of age-related cataract, bilateral: Secondary | ICD-10-CM | POA: Diagnosis not present

## 2021-01-16 DIAGNOSIS — H179 Unspecified corneal scar and opacity: Secondary | ICD-10-CM | POA: Diagnosis not present

## 2021-01-16 DIAGNOSIS — H40023 Open angle with borderline findings, high risk, bilateral: Secondary | ICD-10-CM | POA: Diagnosis not present

## 2021-01-17 ENCOUNTER — Telehealth: Payer: Self-pay | Admitting: Orthopedic Surgery

## 2021-01-17 DIAGNOSIS — G894 Chronic pain syndrome: Secondary | ICD-10-CM

## 2021-01-17 MED ORDER — HYDROCODONE-ACETAMINOPHEN 5-325 MG PO TABS: 1.0000 | ORAL_TABLET | Freq: Four times a day (QID) | ORAL | 0 refills | Status: DC | PRN

## 2021-01-17 NOTE — Telephone Encounter (Signed)
Patient requests refill: HYDROcodone-acetaminophen (NORCO/VICODIN) 5-325 MG tablet 56 tablet   [facility], [address], [place]

## 2021-01-31 ENCOUNTER — Other Ambulatory Visit: Payer: Self-pay | Admitting: Orthopedic Surgery

## 2021-01-31 DIAGNOSIS — G894 Chronic pain syndrome: Secondary | ICD-10-CM

## 2021-01-31 MED ORDER — HYDROCODONE-ACETAMINOPHEN 5-325 MG PO TABS: 1.0000 | ORAL_TABLET | Freq: Four times a day (QID) | ORAL | 0 refills | Status: DC | PRN

## 2021-01-31 NOTE — Telephone Encounter (Signed)
Patient requests prescription for Hydrocodone/Acetaminophen 5-325 mgs.  Qty  56  Sig: Take 1 tablet by mouth every 6 (six) hours as needed for moderate pain.  Patient uses [facility] representative

## 2021-02-14 ENCOUNTER — Other Ambulatory Visit: Payer: Self-pay | Admitting: Orthopedic Surgery

## 2021-02-14 DIAGNOSIS — G894 Chronic pain syndrome: Secondary | ICD-10-CM

## 2021-02-14 MED ORDER — HYDROCODONE-ACETAMINOPHEN 5-325 MG PO TABS: 1.0000 | ORAL_TABLET | Freq: Four times a day (QID) | ORAL | 0 refills | Status: DC | PRN

## 2021-02-14 NOTE — Telephone Encounter (Signed)
Patient called for refill: HYDROcodone-acetaminophen (NORCO/VICODIN) 5-325 MG tablet 56 tablet   [facility], [place], [place]

## 2021-03-01 DIAGNOSIS — M1712 Unilateral primary osteoarthritis, left knee: Secondary | ICD-10-CM | POA: Diagnosis not present

## 2021-03-01 DIAGNOSIS — K219 Gastro-esophageal reflux disease without esophagitis: Secondary | ICD-10-CM | POA: Diagnosis not present

## 2021-03-01 DIAGNOSIS — E1165 Type 2 diabetes mellitus with hyperglycemia: Secondary | ICD-10-CM | POA: Diagnosis not present

## 2021-03-01 DIAGNOSIS — I10 Essential (primary) hypertension: Secondary | ICD-10-CM | POA: Diagnosis not present

## 2021-03-02 ENCOUNTER — Other Ambulatory Visit: Payer: Self-pay | Admitting: Orthopedic Surgery

## 2021-03-02 DIAGNOSIS — G894 Chronic pain syndrome: Secondary | ICD-10-CM

## 2021-03-02 MED ORDER — HYDROCODONE-ACETAMINOPHEN 5-325 MG PO TABS: 1.0000 | ORAL_TABLET | Freq: Four times a day (QID) | ORAL | 0 refills | Status: AC | PRN

## 2021-03-02 NOTE — Telephone Encounter (Signed)
Medication refill request (per patient at 11:53am on 03/02/21):  HYDROcodone-acetaminophen (NORCO/VICODIN) 5-325 MG tablet  [place], [place]

## 2021-03-02 NOTE — Telephone Encounter (Signed)
Patient requests refill / aware that while Dr Aline Brochure is out of clinic, that we are sending requests to practice manager to have provider on call to review requests.

## 2021-03-14 ENCOUNTER — Other Ambulatory Visit: Payer: Self-pay | Admitting: Orthopedic Surgery

## 2021-03-14 DIAGNOSIS — M1711 Unilateral primary osteoarthritis, right knee: Secondary | ICD-10-CM

## 2021-03-15 NOTE — Patient Instructions (Signed)
Terry Case  03/15/2021     '@[email or web address]'$ @   Your procedure is scheduled on  03/21/2021.   Report to [facility] at  [phone]  A.M.   Call this number if you have problems the morning of surgery:  [phone]   Remember:  Do not eat or drink after midnight.       DO NOT take any medications for diabetes the morning of your procedure.    Take these medicines the morning of surgery with A SIP OF WATER                         Amlodipine, benadryl, pepcid.     Do not wear jewelry, make-up or nail polish.  Do not wear lotions, powders, or perfumes, or deodorant.  Do not shave 48 hours prior to surgery.  Men may shave face and neck.  Do not bring valuables to the hospital.  [facility] is not responsible for any belongings or valuables.  Contacts, dentures or bridgework may not be worn into surgery.  Leave your suitcase in the car.  After surgery it may be brought to your room.  For patients admitted to the hospital, discharge time will be determined by your treatment team.  Patients discharged the day of surgery will not be allowed to drive home and must have someone with them for 24 hours.    Special instructions:   DO NOT smoke tobacco or vape fore 24 hours before your procedure.  Please read over the following fact sheets that you were given. Pain Booklet, Coughing and Deep Breathing, Blood Transfusion Information, Total Joint Packet, Surgical Site Infection Prevention, Anesthesia Post-op Instructions, and Care and Recovery After Surgery      Total Knee Replacement, Care After This sheet gives you information about how to care for yourself after your procedure. Your health care provider may also give you more specific instructions. If you have problems or questions, contact your health care provider. What can I expect after the procedure? After the procedure, it is common to have: Redness, pain, and swelling at the incision  area. Stiffness. Discomfort. A small amount of blood or clear fluid coming from your incision. Follow these instructions at home: Medicines Take over-the-counter and prescription medicines only as told by your health care provider. If you were prescribed a blood thinner (anticoagulant), take it as told by your health care provider. Ask your health care provider if the medicine prescribed to you: Requires you to avoid driving or using machinery. Can cause constipation. You may need to take these actions to prevent or treat constipation: Drink enough fluid to keep your urine pale yellow. Take over-the-counter or prescription medicines. Eat foods that are high in fiber, such as beans, whole grains, and fresh fruits and vegetables. Limit foods that are high in fat and processed sugars, such as fried or sweet foods. Incision care  Follow instructions from your health care provider about how to take care of your incision. Make sure you: Wash your hands with soap and water for at least 20 seconds before and after you change your bandage (dressing). If soap and water are not available, use hand sanitizer. Change your dressing as told by your health care provider. Leave stitches (sutures), staples, skin glue, or adhesive strips in place. These skin closures may need to stay in place for 2 weeks or longer. If adhesive strip edges start to loosen  and curl up, you may trim the loose edges. Do not remove adhesive strips completely unless your health care provider tells you to do that. Do not take baths, swim, or use a hot tub until your health care provider approves. Check your incision area every day for signs of infection. Check for: More redness, swelling, or pain. More fluid or blood. Warmth. Pus or a bad smell. Activity Rest as told by your health care provider. Avoid sitting for a long time without moving. Get up to take short walks every 1-2 hours. This is important to improve blood flow and  breathing. Ask for help if you feel weak or unsteady. Follow instructions from your health care provider about using a walker, crutches, or a cane. You may use your legs to support (bear) your body weight as told by your health care provider. Follow instructions about how much weight you may safely support on your affected leg (weight-bearing restrictions). A physical therapist may show you how to get out of a bed and chair and how to go up and down stairs. You will first do this with a walker, crutches, or a cane and then without any of these devices. Once you are able to walk without a limp, you may stop using a walker, crutches, or a cane. Do exercises as told by your health care provider or physical therapist. Avoid high-impact activities, including running, jumping rope, and doing jumping jacks. Do not play contact sports until your health care provider approves. Return to your normal activities as told by your health care provider. Ask your health care provider what activities are safe for you. Managing pain, stiffness, and swelling  If directed, put ice on your knee. To do this: Put ice in a plastic bag or use the icing device (cold flow pad) that you were given. Follow instructions from your health care provider about how to use the icing device. Place a towel between your skin and the bag or between your skin and the icing device. Leave the ice on for 20 minutes, 2-3 times a day. Remove the ice if your skin turns bright red. This is very important. If you cannot feel pain, heat, or cold, you have a greater risk of damage to the area. Move your toes often to reduce stiffness and swelling. Raise (elevate) your leg above the level of your heart while you are sitting or lying down. Use several pillows to keep your leg straight. Do not put a pillow just under the knee. If the knee is bent for a long time, this may lead to stiffness. Wear elastic knee support as told by your health care  provider. Safety  To help prevent falls, keep floors clear of objects you may trip over. Place items that you may need within easy reach. Wear an apron or tool belt with pockets for carrying objects. This leaves your hands free to help with your balance. Ask your health care provider when it is safe to drive. General instructions Wear compression stockings as told by your health care provider. These stockings help to prevent blood clots and reduce swelling in your legs. Continue with breathing exercises. This helps prevent lung infection. Do not use any products that contain nicotine or tobacco. These products include cigarettes, chewing tobacco, and vaping devices, such as e-cigarettes. These can delay healing after surgery. If you need help quitting, ask your health care provider. Tell your health care provider if you plan to have dental work. Also: Tell your dentist  about your joint replacement. Ask your health care provider if there are any special instructions you need to follow before having dental care and routine cleanings. Keep all follow-up visits. This is important. Contact a health care provider if: You have a fever or chills. You have a cough or feel short of breath. Your medicine is not controlling your pain. You have any of these signs of infection: More redness, swelling, or pain around your incision. More fluid or blood coming from your incision. Warmth coming from your incision. Pus or a bad smell coming from your incision. You fall. Get help right away if: You have severe pain. You have trouble breathing. You have chest pain. You have redness, swelling, pain, or warmth in your calf or leg. Your incision breaks open after sutures or staples are removed. These symptoms may represent a serious problem that is an emergency. Do not wait to see if the symptoms will go away. Get medical help right away. Call your local emergency services (911 in the [place] Do not drive  yourself to the hospital. Summary After the procedure, it is common to have pain and swelling at the incision area, a small amount of blood or fluid coming from your incision, and stiffness. Follow instructions from your health care provider about how to take care of your incision. Use crutches, a walker, or a cane as told by your health care provider. This information is not intended to replace advice given to you by your health care provider. Make sure you discuss any questions you have with your health care provider. Document Revised: 12/08/2019 Document Reviewed: 12/08/2019 Elsevier Patient Education  [place]. Spinal Anesthesia and Epidural Anesthesia, Care After This sheet gives you information about how to care for yourself after your procedure. Your doctor may also give you more specific instructions. If you have problems or questions, contact your doctor. What can I expect after the procedure? While the medicines you were given are still having effects, it is common to: Feel like you may vomit (nauseous). Vomit. Have a numb feeling or tingling in your legs. Have problems when you pee (urinate). Feel itchy. Follow these instructions at home: For the time period you were told by your doctor:  Rest. Do not do activities where you could fall or get hurt. Do not drive or use machines. Do not drink alcohol. Do not take sleeping pills or medicines that make you drowsy. Do not make big decisions or sign legal documents. Do not take care of children on your own. Eating and drinking If you vomit, wait a short time. When you can drink without vomiting, try to drink some water, juice, or clear soup. Drink enough fluid to keep your pee (urine) pale yellow. Make sure you do not feel like vomiting before you eat solid foods. Follow the diet that your doctor recommends. General instructions If you have sleep apnea, surgery and certain medicines can raise your risk for breathing  problems. Follow instructions from your doctor about when to wear your sleep device. Have a responsible adult stay with you for the time you are told. It is important to have someone help care for you until you are awake and alert. Return to your normal activities as told by your doctor. Ask your doctor what activities are safe for you. Take over-the-counter and prescription medicines only as told by your doctor. Do not use any products that contain nicotine or tobacco, such as cigarettes, e-cigarettes, and chewing tobacco. If you need  help quitting, ask your doctor. Keep all follow-up visits as told by your doctor. This is important. Contact a doctor if: It has been more than one day since your procedure and you feel like you may vomit or you are vomiting. You have a rash. Get help right away if: You have a fever. You have a headache that lasts a long time. You have a very bad headache. Your vision is blurry or you see two of a single object (double vision). You are dizzy or light-headed. You faint. Your arms or legs tingle, feel weak, or get numb. You have trouble breathing. You cannot pee. These symptoms may be an emergency. Get help right away. Call your local emergency services (911 in the [place] Do not wait to see if the symptoms will go away. Do not drive yourself to the hospital. Summary After the procedure, have a responsible adult stay with you at home. Do not do activities that might cause you to get hurt. Do not drive, use machinery, drink alcohol, or make important decisions as told by your doctor. Do not use products that contain nicotine or tobacco. Get help right away if you have a very bad headache, trouble breathing, or you cannot pee. This information is not intended to replace advice given to you by your health care provider. Make sure you discuss any questions you have with your health care provider. Document Revised: 03/03/2020 Document Reviewed:  08/06/2019 Elsevier Patient Education  [place] Anesthesia, Adult, Care After This sheet gives you information about how to care for yourself after your procedure. Your health care provider may also give you more specific instructions. If you have problems or questions, contact your health care provider. What can I expect after the procedure? After the procedure, the following side effects are common: Pain or discomfort at the IV site. Nausea. Vomiting. Sore throat. Trouble concentrating. Feeling cold or chills. Feeling weak or tired. Sleepiness and fatigue. Soreness and body aches. These side effects can affect parts of the body that were not involved in surgery. Follow these instructions at home: For the time period you were told by your health care provider:  Rest. Do not participate in activities where you could fall or become injured. Do not drive or use machinery. Do not drink alcohol. Do not take sleeping pills or medicines that cause drowsiness. Do not make important decisions or sign legal documents. Do not take care of children on your own. Eating and drinking Follow any instructions from your health care provider about eating or drinking restrictions. When you feel hungry, start by eating small amounts of foods that are soft and easy to digest (bland), such as toast. Gradually return to your regular diet. Drink enough fluid to keep your urine pale yellow. If you vomit, rehydrate by drinking water, juice, or clear broth. General instructions If you have sleep apnea, surgery and certain medicines can increase your risk for breathing problems. Follow instructions from your health care provider about wearing your sleep device: Anytime you are sleeping, including during daytime naps. While taking prescription pain medicines, sleeping medicines, or medicines that make you drowsy. Have a responsible adult stay with you for the time you are told. It is  important to have someone help care for you until you are awake and alert. Return to your normal activities as told by your health care provider. Ask your health care provider what activities are safe for you. Take over-the-counter and prescription medicines only as told by  your health care provider. If you smoke, do not smoke without supervision. Keep all follow-up visits as told by your health care provider. This is important. Contact a health care provider if: You have nausea or vomiting that does not get better with medicine. You cannot eat or drink without vomiting. You have pain that does not get better with medicine. You are unable to pass urine. You develop a skin rash. You have a fever. You have redness around your IV site that gets worse. Get help right away if: You have difficulty breathing. You have chest pain. You have blood in your urine or stool, or you vomit blood. Summary After the procedure, it is common to have a sore throat or nausea. It is also common to feel tired. Have a responsible adult stay with you for the time you are told. It is important to have someone help care for you until you are awake and alert. When you feel hungry, start by eating small amounts of foods that are soft and easy to digest (bland), such as toast. Gradually return to your regular diet. Drink enough fluid to keep your urine pale yellow. Return to your normal activities as told by your health care provider. Ask your health care provider what activities are safe for you. This information is not intended to replace advice given to you by your health care provider. Make sure you discuss any questions you have with your health care provider. Document Revised: 03/03/2020 Document Reviewed: 10/01/2019 Elsevier Patient Education  2022 [place]. How to Use Chlorhexidine for Bathing Chlorhexidine gluconate (CHG) is a germ-killing (antiseptic) solution that is used to clean the skin. It can get  rid of the bacteria that normally live on the skin and can keep them away for about 24 hours. To clean your skin with CHG, you may be given: A CHG solution to use in the shower or as part of a sponge bath. A prepackaged cloth that contains CHG. Cleaning your skin with CHG may help lower the risk for infection: While you are staying in the intensive care unit of the hospital. If you have a vascular access, such as a central line, to provide short-term or long-term access to your veins. If you have a catheter to drain urine from your bladder. If you are on a ventilator. A ventilator is a machine that helps you breathe by moving air in and out of your lungs. After surgery. What are the risks? Risks of using CHG include: A skin reaction. Hearing loss, if CHG gets in your ears and you have a perforated eardrum. Eye injury, if CHG gets in your eyes and is not rinsed out. The CHG product catching fire. Make sure that you avoid smoking and flames after applying CHG to your skin. Do not use CHG: If you have a chlorhexidine allergy or have previously reacted to chlorhexidine. On babies younger than 73 months of age. How to use CHG solution Use CHG only as told by your health care provider, and follow the instructions on the label. Use the full amount of CHG as directed. Usually, this is one bottle. During a shower Follow these steps when using CHG solution during a shower (unless your health care provider gives you different instructions): Start the shower. Use your normal soap and shampoo to wash your face and hair. Turn off the shower or move out of the shower stream. Pour the CHG onto a clean washcloth. Do not use any type of brush or rough-edged  sponge. Starting at your neck, lather your body down to your toes. Make sure you follow these instructions: If you will be having surgery, pay special attention to the part of your body where you will be having surgery. Scrub this area for at least 1  minute. Do not use CHG on your head or face. If the solution gets into your ears or eyes, rinse them well with water. Avoid your genital area. Avoid any areas of skin that have broken skin, cuts, or scrapes. Scrub your back and under your arms. Make sure to wash skin folds. Let the lather sit on your skin for 1-2 minutes or as long as told by your health care provider. Thoroughly rinse your entire body in the shower. Make sure that all body creases and crevices are rinsed well. Dry off with a clean towel. Do not put any substances on your body afterward--such as powder, lotion, or perfume--unless you are told to do so by your health care provider. Only use lotions that are recommended by the manufacturer. Put on clean clothes or pajamas. If it is the night before your surgery, sleep in clean sheets.  During a sponge bath Follow these steps when using CHG solution during a sponge bath (unless your health care provider gives you different instructions): Use your normal soap and shampoo to wash your face and hair. Pour the CHG onto a clean washcloth. Starting at your neck, lather your body down to your toes. Make sure you follow these instructions: If you will be having surgery, pay special attention to the part of your body where you will be having surgery. Scrub this area for at least 1 minute. Do not use CHG on your head or face. If the solution gets into your ears or eyes, rinse them well with water. Avoid your genital area. Avoid any areas of skin that have broken skin, cuts, or scrapes. Scrub your back and under your arms. Make sure to wash skin folds. Let the lather sit on your skin for 1-2 minutes or as long as told by your health care provider. Using a different clean, wet washcloth, thoroughly rinse your entire body. Make sure that all body creases and crevices are rinsed well. Dry off with a clean towel. Do not put any substances on your body afterward--such as powder, lotion, or  perfume--unless you are told to do so by your health care provider. Only use lotions that are recommended by the manufacturer. Put on clean clothes or pajamas. If it is the night before your surgery, sleep in clean sheets. How to use CHG prepackaged cloths Only use CHG cloths as told by your health care provider, and follow the instructions on the label. Use the CHG cloth on clean, dry skin. Do not use the CHG cloth on your head or face unless your health care provider tells you to. When washing with the CHG cloth: Avoid your genital area. Avoid any areas of skin that have broken skin, cuts, or scrapes. Before surgery Follow these steps when using a CHG cloth to clean before surgery (unless your health care provider gives you different instructions): Using the CHG cloth, vigorously scrub the part of your body where you will be having surgery. Scrub using a back-and-forth motion for 3 minutes. The area on your body should be completely wet with CHG when you are done scrubbing. Do not rinse. Discard the cloth and let the area air-dry. Do not put any substances on the area afterward, such as powder,  lotion, or perfume. Put on clean clothes or pajamas. If it is the night before your surgery, sleep in clean sheets.  For general bathing Follow these steps when using CHG cloths for general bathing (unless your health care provider gives you different instructions). Use a separate CHG cloth for each area of your body. Make sure you wash between any folds of skin and between your fingers and toes. Wash your body in the following order, switching to a new cloth after each step: The front of your neck, shoulders, and chest. Both of your arms, under your arms, and your hands. Your stomach and groin area, avoiding the genitals. Your right leg and foot. Your left leg and foot. The back of your neck, your back, and your buttocks. Do not rinse. Discard the cloth and let the area air-dry. Do not put any  substances on your body afterward--such as powder, lotion, or perfume--unless you are told to do so by your health care provider. Only use lotions that are recommended by the manufacturer. Put on clean clothes or pajamas. Contact a health care provider if: Your skin gets irritated after scrubbing. You have questions about using your solution or cloth. You swallow any chlorhexidine. Call your local poison control center (1-807 384 8596 in the [place] Get help right away if: Your eyes itch badly, or they become very red or swollen. Your skin itches badly and is red or swollen. Your hearing changes. You have trouble seeing. You have swelling or tingling in your mouth or throat. You have trouble breathing. These symptoms may represent a serious problem that is an emergency. Do not wait to see if the symptoms will go away. Get medical help right away. Call your local emergency services (911 in the [place] Do not drive yourself to the hospital. Summary Chlorhexidine gluconate (CHG) is a germ-killing (antiseptic) solution that is used to clean the skin. Cleaning your skin with CHG may help to lower your risk for infection. You may be given CHG to use for bathing. It may be in a bottle or in a prepackaged cloth to use on your skin. Carefully follow your health care provider's instructions and the instructions on the product label. Do not use CHG if you have a chlorhexidine allergy. Contact your health care provider if your skin gets irritated after scrubbing. This information is not intended to replace advice given to you by your health care provider. Make sure you discuss any questions you have with your health care provider. Document Revised: 08/29/2020 Document Reviewed: 08/29/2020 Elsevier Patient Education  2022 [employer].

## 2021-03-16 ENCOUNTER — Other Ambulatory Visit: Payer: Self-pay | Admitting: Orthopedic Surgery

## 2021-03-16 MED ORDER — HYDROCODONE-ACETAMINOPHEN 5-325 MG PO TABS: 1.0000 | ORAL_TABLET | Freq: Four times a day (QID) | ORAL | 0 refills | Status: DC | PRN

## 2021-03-16 NOTE — Telephone Encounter (Signed)
Patient requests refill: HYDROcodone-acetaminophen (NORCO/VICODIN) 5-325 MG tablet 28 tablet      [employer], [address] [employer]

## 2021-03-17 ENCOUNTER — Encounter (HOSPITAL_COMMUNITY)
Admission: RE | Admit: 2021-03-17 | Discharge: 2021-03-17 | Disposition: A | Discharge status: Home / Self Care | Payer: Medicare Other | Source: Ambulatory Visit | Attending: Orthopedic Surgery | Admitting: Orthopedic Surgery

## 2021-03-17 ENCOUNTER — Other Ambulatory Visit (HOSPITAL_COMMUNITY)
Admission: RE | Admit: 2021-03-17 | Discharge: 2021-03-17 | Disposition: A | Discharge status: Home / Self Care | Payer: Medicare Other | Source: Ambulatory Visit | Attending: Orthopedic Surgery | Admitting: Orthopedic Surgery

## 2021-03-17 ENCOUNTER — Encounter (HOSPITAL_COMMUNITY): Payer: Self-pay

## 2021-03-17 ENCOUNTER — Other Ambulatory Visit: Payer: Self-pay

## 2021-03-17 VITALS — BP 130/82 | HR 70 | Temp 98.5°F | Resp 18 | Ht 68.0 in | Wt 261.0 lb

## 2021-03-17 DIAGNOSIS — Z01818 Encounter for other preprocedural examination: Secondary | ICD-10-CM | POA: Insufficient documentation

## 2021-03-17 DIAGNOSIS — Z20822 Contact with and (suspected) exposure to covid-19: Secondary | ICD-10-CM | POA: Diagnosis not present

## 2021-03-17 DIAGNOSIS — Z01812 Encounter for preprocedural laboratory examination: Secondary | ICD-10-CM | POA: Insufficient documentation

## 2021-03-17 DIAGNOSIS — E119 Type 2 diabetes mellitus without complications: Secondary | ICD-10-CM

## 2021-03-17 LAB — CBC WITH DIFFERENTIAL/PLATELET
Abs Immature Granulocytes: 0.01 10*3/uL (ref 0.00–0.07)
Basophils Absolute: 0 10*3/uL (ref 0.0–0.1)
Basophils Relative: 1 %
Eosinophils Absolute: 0.2 10*3/uL (ref 0.0–0.5)
Eosinophils Relative: 4 %
HCT: 39.6 % (ref 39.0–52.0)
Hemoglobin: 12.3 g/dL — ABNORMAL LOW (ref 13.0–17.0)
Immature Granulocytes: 0 %
Lymphocytes Relative: 28 %
Lymphs Abs: 1.2 10*3/uL (ref 0.7–4.0)
MCH: 27 pg (ref 26.0–34.0)
MCHC: 31.1 g/dL (ref 30.0–36.0)
MCV: 86.8 fL (ref 80.0–100.0)
Monocytes Absolute: 0.4 10*3/uL (ref 0.1–1.0)
Monocytes Relative: 10 %
Neutro Abs: 2.4 10*3/uL (ref 1.7–7.7)
Neutrophils Relative %: 57 %
Platelets: 226 10*3/uL (ref 150–400)
RBC: 4.56 MIL/uL (ref 4.22–5.81)
RDW: 14.5 % (ref 11.5–15.5)
WBC: 4.3 10*3/uL (ref 4.0–10.5)
nRBC: 0 % (ref 0.0–0.2)

## 2021-03-17 LAB — BASIC METABOLIC PANEL
Anion gap: 7 (ref 5–15)
BUN: 21 mg/dL — ABNORMAL HIGH (ref 6–20)
CO2: 23 mmol/L (ref 22–32)
Calcium: 8.4 mg/dL — ABNORMAL LOW (ref 8.9–10.3)
Chloride: 107 mmol/L (ref 98–111)
Creatinine, Ser: 0.92 mg/dL (ref 0.61–1.24)
GFR, Estimated: >60 mL/min (ref >60)
Glucose, Bld: 95 mg/dL (ref 70–99)
Potassium: 4.3 mmol/L (ref 3.5–5.1)
Sodium: 137 mmol/L (ref 135–145)

## 2021-03-17 LAB — HEMOGLOBIN A1C
Hgb A1c MFr Bld: 5.8 % — ABNORMAL HIGH (ref 4.8–5.6)
Mean Plasma Glucose: 119.76 mg/dL

## 2021-03-17 LAB — TYPE AND SCREEN
ABO/RH(D): B POS
Antibody Screen: NEGATIVE

## 2021-03-17 LAB — PREPARE RBC (CROSSMATCH)

## 2021-03-17 LAB — SARS CORONAVIRUS 2 (TAT 6-24 HRS): SARS Coronavirus 2: NEGATIVE

## 2021-03-20 ENCOUNTER — Telehealth: Payer: Self-pay | Admitting: Orthopedic Surgery

## 2021-03-20 NOTE — Telephone Encounter (Signed)
Patient called to relay that the person who was to bring the CPM to his home and instruct how to use did not come today - and surgery is tomorrow, 03/21/21. Please advise.

## 2021-03-20 NOTE — Telephone Encounter (Signed)
They will have to bring if after They will call him  Will you let him know?  I am in clinic.

## 2021-03-20 NOTE — Telephone Encounter (Signed)
Called patient; relayed; understands.
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
## 2021-03-21 ENCOUNTER — Ambulatory Visit (HOSPITAL_COMMUNITY): Payer: Medicare Other

## 2021-03-21 ENCOUNTER — Encounter (HOSPITAL_COMMUNITY): Payer: Self-pay | Admitting: Orthopedic Surgery

## 2021-03-21 ENCOUNTER — Ambulatory Visit (HOSPITAL_COMMUNITY): Payer: Medicare Other | Admitting: Certified Registered"

## 2021-03-21 ENCOUNTER — Other Ambulatory Visit: Payer: Self-pay

## 2021-03-21 ENCOUNTER — Encounter (HOSPITAL_COMMUNITY)
Admission: RE | Disposition: A | Discharge status: Home / Self Care | Payer: Self-pay | Source: Home / Self Care | Attending: Orthopedic Surgery

## 2021-03-21 ENCOUNTER — Observation Stay (HOSPITAL_COMMUNITY)
Admission: RE | Admit: 2021-03-21 | Discharge: 2021-03-22 | Disposition: A | Discharge status: Home / Self Care | Payer: Medicare Other | Attending: Orthopedic Surgery | Admitting: Orthopedic Surgery

## 2021-03-21 DIAGNOSIS — Z79899 Other long term (current) drug therapy: Secondary | ICD-10-CM | POA: Diagnosis not present

## 2021-03-21 DIAGNOSIS — M21161 Varus deformity, not elsewhere classified, right knee: Secondary | ICD-10-CM | POA: Diagnosis not present

## 2021-03-21 DIAGNOSIS — Z87891 Personal history of nicotine dependence: Secondary | ICD-10-CM | POA: Diagnosis not present

## 2021-03-21 DIAGNOSIS — G4733 Obstructive sleep apnea (adult) (pediatric): Secondary | ICD-10-CM | POA: Diagnosis not present

## 2021-03-21 DIAGNOSIS — Z96651 Presence of right artificial knee joint: Secondary | ICD-10-CM

## 2021-03-21 DIAGNOSIS — I10 Essential (primary) hypertension: Secondary | ICD-10-CM | POA: Diagnosis not present

## 2021-03-21 DIAGNOSIS — J449 Chronic obstructive pulmonary disease, unspecified: Secondary | ICD-10-CM | POA: Diagnosis not present

## 2021-03-21 DIAGNOSIS — Z7984 Long term (current) use of oral hypoglycemic drugs: Secondary | ICD-10-CM | POA: Diagnosis not present

## 2021-03-21 DIAGNOSIS — M1711 Unilateral primary osteoarthritis, right knee: Secondary | ICD-10-CM | POA: Diagnosis not present

## 2021-03-21 DIAGNOSIS — J45909 Unspecified asthma, uncomplicated: Secondary | ICD-10-CM | POA: Diagnosis not present

## 2021-03-21 DIAGNOSIS — E119 Type 2 diabetes mellitus without complications: Secondary | ICD-10-CM | POA: Diagnosis not present

## 2021-03-21 DIAGNOSIS — G8918 Other acute postprocedural pain: Secondary | ICD-10-CM | POA: Diagnosis not present

## 2021-03-21 HISTORY — PX: TOTAL KNEE ARTHROPLASTY: SHX125

## 2021-03-21 LAB — GLUCOSE, CAPILLARY
Glucose-Capillary: 112 mg/dL — ABNORMAL HIGH (ref 70–99)
Glucose-Capillary: 86 mg/dL (ref 70–99)
Glucose-Capillary: 123 mg/dL — ABNORMAL HIGH (ref 70–99)
Glucose-Capillary: 146 mg/dL — ABNORMAL HIGH (ref 70–99)

## 2021-03-21 LAB — ABO/RH: ABO/RH(D): B POS

## 2021-03-21 SURGERY — ARTHROPLASTY, KNEE, TOTAL
Anesthesia: Spinal | Site: Knee | Laterality: Right

## 2021-03-21 MED ORDER — FAMOTIDINE 20 MG PO TABS
20.0000 mg | ORAL_TABLET | Freq: Every day | ORAL | Status: DC
  Administered 2021-03-21: 20 mg via ORAL
  Filled 2021-03-21 (×2): qty 1

## 2021-03-21 MED ORDER — ASPIRIN EC 325 MG PO TBEC
325.0000 mg | DELAYED_RELEASE_TABLET | Freq: Every day | ORAL | Status: DC
  Administered 2021-03-22: 325 mg via ORAL
  Filled 2021-03-21: qty 1

## 2021-03-21 MED ORDER — MIDAZOLAM HCL 5 MG/5ML IJ SOLN
INTRAMUSCULAR | Status: DC | PRN
  Administered 2021-03-21: 2 mg via INTRAVENOUS

## 2021-03-21 MED ORDER — 0.9 % SODIUM CHLORIDE (POUR BTL) OPTIME
TOPICAL | Status: DC | PRN
  Administered 2021-03-21: 1000 mL

## 2021-03-21 MED ORDER — ROPIVACAINE HCL 5 MG/ML IJ SOLN
INTRAMUSCULAR | Status: AC
  Filled 2021-03-21: qty 30

## 2021-03-21 MED ORDER — SODIUM CHLORIDE 0.9 % IR SOLN
Status: DC | PRN
  Administered 2021-03-21: 3000 mL

## 2021-03-21 MED ORDER — MIDAZOLAM HCL 2 MG/2ML IJ SOLN
INTRAMUSCULAR | Status: AC
  Filled 2021-03-21: qty 2

## 2021-03-21 MED ORDER — FENTANYL CITRATE (PF) 100 MCG/2ML IJ SOLN
INTRAMUSCULAR | Status: DC | PRN
  Administered 2021-03-21 (×2): 50 ug via INTRAVENOUS

## 2021-03-21 MED ORDER — ONDANSETRON HCL 4 MG/2ML IJ SOLN
4.0000 mg | Freq: Four times a day (QID) | INTRAMUSCULAR | Status: DC
  Filled 2021-03-21 (×3): qty 2

## 2021-03-21 MED ORDER — NETARSUDIL-LATANOPROST 0.02-0.005 % OP SOLN: 1.0000 [drp] | Freq: Every day | OPHTHALMIC | Status: DC

## 2021-03-21 MED ORDER — LACTATED RINGERS IV SOLN: INTRAVENOUS | Status: DC

## 2021-03-21 MED ORDER — CEFAZOLIN SODIUM-DEXTROSE 2-4 GM/100ML-% IV SOLN
2.0000 g | INTRAVENOUS | Status: AC
  Administered 2021-03-21: 2 g via INTRAVENOUS

## 2021-03-21 MED ORDER — CEFAZOLIN SODIUM-DEXTROSE 2-4 GM/100ML-% IV SOLN
2.0000 g | Freq: Four times a day (QID) | INTRAVENOUS | Status: AC
Start: 2021-03-21 — End: 2021-03-21
  Administered 2021-03-21 (×2): 2 g via INTRAVENOUS
  Filled 2021-03-21 (×2): qty 100

## 2021-03-21 MED ORDER — EPHEDRINE SULFATE 50 MG/ML IJ SOLN
INTRAMUSCULAR | Status: DC | PRN
  Administered 2021-03-21 (×3): 5 mg via INTRAVENOUS

## 2021-03-21 MED ORDER — CEFAZOLIN SODIUM-DEXTROSE 2-4 GM/100ML-% IV SOLN
INTRAVENOUS | Status: AC
  Filled 2021-03-21: qty 100

## 2021-03-21 MED ORDER — TRAMADOL HCL 50 MG PO TABS
50.0000 mg | ORAL_TABLET | Freq: Four times a day (QID) | ORAL | Status: DC
  Administered 2021-03-21 – 2021-03-22 (×3): 50 mg via ORAL
  Filled 2021-03-21 (×3): qty 1

## 2021-03-21 MED ORDER — PROPOFOL 10 MG/ML IV BOLUS
INTRAVENOUS | Status: AC
  Filled 2021-03-21: qty 20

## 2021-03-21 MED ORDER — POVIDONE-IODINE 10 % EX SWAB
2.0000 "application " | Freq: Once | CUTANEOUS | Status: DC
  Administered 2021-03-21: 2 via TOPICAL

## 2021-03-21 MED ORDER — ATORVASTATIN CALCIUM 20 MG PO TABS
20.0000 mg | ORAL_TABLET | Freq: Every day | ORAL | Status: DC
  Administered 2021-03-21: 20 mg via ORAL
  Filled 2021-03-21: qty 1

## 2021-03-21 MED ORDER — LINAGLIPTIN 5 MG PO TABS
5.0000 mg | ORAL_TABLET | ORAL | Status: DC
  Administered 2021-03-22: 5 mg via ORAL
  Filled 2021-03-21: qty 1

## 2021-03-21 MED ORDER — FENTANYL CITRATE (PF) 100 MCG/2ML IJ SOLN
INTRAMUSCULAR | Status: AC
  Filled 2021-03-21: qty 2

## 2021-03-21 MED ORDER — DOCUSATE SODIUM 100 MG PO CAPS
100.0000 mg | ORAL_CAPSULE | Freq: Two times a day (BID) | ORAL | Status: DC
  Administered 2021-03-21: 100 mg via ORAL
  Filled 2021-03-21 (×2): qty 1

## 2021-03-21 MED ORDER — ORAL CARE MOUTH RINSE: 15.0000 mL | Freq: Once | OROMUCOSAL | Status: AC

## 2021-03-21 MED ORDER — HALOBETASOL PROPIONATE 0.05 % EX CREA: 1.0000 "application " | TOPICAL_CREAM | Freq: Two times a day (BID) | CUTANEOUS | Status: DC | PRN

## 2021-03-21 MED ORDER — PHENYLEPHRINE HCL (PRESSORS) 10 MG/ML IV SOLN
INTRAVENOUS | Status: DC | PRN
  Administered 2021-03-21 (×3): 80 ug via INTRAVENOUS
  Administered 2021-03-21: 40 ug via INTRAVENOUS

## 2021-03-21 MED ORDER — DEXMEDETOMIDINE (PRECEDEX) IN NS 20 MCG/5ML (4 MCG/ML) IV SYRINGE
PREFILLED_SYRINGE | INTRAVENOUS | Status: AC
  Filled 2021-03-21: qty 5

## 2021-03-21 MED ORDER — ALBUTEROL SULFATE (2.5 MG/3ML) 0.083% IN NEBU: 3.0000 mL | INHALATION_SOLUTION | RESPIRATORY_TRACT | Status: DC | PRN

## 2021-03-21 MED ORDER — GLYCOPYRROLATE 0.2 MG/ML IJ SOLN
INTRAMUSCULAR | Status: DC | PRN
  Administered 2021-03-21: .1 mg via INTRAVENOUS

## 2021-03-21 MED ORDER — BUPIVACAINE-MELOXICAM ER 200-6 MG/7ML IJ SOLN
INTRAMUSCULAR | Status: DC | PRN
  Administered 2021-03-21: 13.4 mL

## 2021-03-21 MED ORDER — PREGABALIN 50 MG PO CAPS
50.0000 mg | ORAL_CAPSULE | Freq: Three times a day (TID) | ORAL | Status: DC
  Administered 2021-03-21 (×3): 50 mg via ORAL
  Filled 2021-03-21 (×4): qty 1

## 2021-03-21 MED ORDER — PHENOL 1.4 % MT LIQD: 1.0000 | OROMUCOSAL | Status: DC | PRN

## 2021-03-21 MED ORDER — DORZOLAMIDE HCL 2 % OP SOLN
1.0000 [drp] | Freq: Two times a day (BID) | OPHTHALMIC | Status: DC
  Administered 2021-03-21 (×2): 1 [drp] via OPHTHALMIC
  Filled 2021-03-21 (×2): qty 10

## 2021-03-21 MED ORDER — ONDANSETRON HCL 4 MG/2ML IJ SOLN: 4.0000 mg | Freq: Four times a day (QID) | INTRAMUSCULAR | Status: DC | PRN

## 2021-03-21 MED ORDER — SENNOSIDES-DOCUSATE SODIUM 8.6-50 MG PO TABS: 1.0000 | ORAL_TABLET | Freq: Every evening | ORAL | Status: DC | PRN

## 2021-03-21 MED ORDER — ROPIVACAINE HCL 5 MG/ML IJ SOLN
INTRAMUSCULAR | Status: DC | PRN
  Administered 2021-03-21: 30 mL via EPIDURAL

## 2021-03-21 MED ORDER — FENTANYL CITRATE PF 50 MCG/ML IJ SOSY
25.0000 ug | PREFILLED_SYRINGE | INTRAMUSCULAR | Status: DC | PRN
  Administered 2021-03-21 (×2): 50 ug via INTRAVENOUS
  Filled 2021-03-21 (×2): qty 1

## 2021-03-21 MED ORDER — CHLORHEXIDINE GLUCONATE 0.12 % MT SOLN: 15.0000 mL | Freq: Once | OROMUCOSAL | Status: AC

## 2021-03-21 MED ORDER — ONDANSETRON HCL 4 MG PO TABS: 4.0000 mg | ORAL_TABLET | Freq: Four times a day (QID) | ORAL | Status: DC | PRN

## 2021-03-21 MED ORDER — METOCLOPRAMIDE HCL 5 MG/ML IJ SOLN: 5.0000 mg | Freq: Three times a day (TID) | INTRAMUSCULAR | Status: DC | PRN

## 2021-03-21 MED ORDER — LIDOCAINE HCL (CARDIAC) PF 50 MG/5ML IV SOSY
PREFILLED_SYRINGE | INTRAVENOUS | Status: DC | PRN
  Administered 2021-03-21: 1 mL via INTRAVENOUS

## 2021-03-21 MED ORDER — OXYCODONE HCL 5 MG PO TABS
5.0000 mg | ORAL_TABLET | ORAL | Status: DC
  Administered 2021-03-21 – 2021-03-22 (×4): 5 mg via ORAL
  Filled 2021-03-21 (×5): qty 1

## 2021-03-21 MED ORDER — PHENYLEPHRINE 40 MCG/ML (10ML) SYRINGE FOR IV PUSH (FOR BLOOD PRESSURE SUPPORT)
PREFILLED_SYRINGE | INTRAVENOUS | Status: AC
  Filled 2021-03-21: qty 10

## 2021-03-21 MED ORDER — EPHEDRINE 5 MG/ML INJ
INTRAVENOUS | Status: AC
  Filled 2021-03-21: qty 5

## 2021-03-21 MED ORDER — MENTHOL 3 MG MT LOZG: 1.0000 | LOZENGE | OROMUCOSAL | Status: DC | PRN

## 2021-03-21 MED ORDER — GLYCOPYRROLATE PF 0.2 MG/ML IJ SOSY
PREFILLED_SYRINGE | INTRAMUSCULAR | Status: AC
  Filled 2021-03-21: qty 1

## 2021-03-21 MED ORDER — TRANEXAMIC ACID-NACL 1000-0.7 MG/100ML-% IV SOLN
1000.0000 mg | INTRAVENOUS | Status: AC
  Administered 2021-03-21: 1000 mg via INTRAVENOUS

## 2021-03-21 MED ORDER — CELECOXIB 100 MG PO CAPS
200.0000 mg | ORAL_CAPSULE | Freq: Every day | ORAL | Status: DC
  Administered 2021-03-21: 200 mg via ORAL
  Filled 2021-03-21 (×2): qty 2

## 2021-03-21 MED ORDER — DEXMEDETOMIDINE (PRECEDEX) IN NS 20 MCG/5ML (4 MCG/ML) IV SYRINGE
PREFILLED_SYRINGE | INTRAVENOUS | Status: DC | PRN
  Administered 2021-03-21: 20 ug via INTRAVENOUS

## 2021-03-21 MED ORDER — TRANEXAMIC ACID-NACL 1000-0.7 MG/100ML-% IV SOLN
INTRAVENOUS | Status: AC
  Filled 2021-03-21: qty 100

## 2021-03-21 MED ORDER — DIPHENHYDRAMINE HCL 25 MG PO CAPS
50.0000 mg | ORAL_CAPSULE | Freq: Every morning | ORAL | Status: DC
  Administered 2021-03-22: 50 mg via ORAL
  Filled 2021-03-21: qty 2

## 2021-03-21 MED ORDER — SODIUM CHLORIDE 0.9 % IV SOLN: INTRAVENOUS | Status: DC

## 2021-03-21 MED ORDER — INSULIN ASPART 100 UNIT/ML IJ SOLN
0.0000 [IU] | Freq: Three times a day (TID) | INTRAMUSCULAR | Status: DC
  Administered 2021-03-21: 1 [IU] via SUBCUTANEOUS

## 2021-03-21 MED ORDER — METHOCARBAMOL 1000 MG/10ML IJ SOLN
500.0000 mg | Freq: Four times a day (QID) | INTRAVENOUS | Status: DC
  Administered 2021-03-21: 500 mg via INTRAVENOUS
  Filled 2021-03-21: qty 500

## 2021-03-21 MED ORDER — BUPIVACAINE HCL (PF) 0.5 % IJ SOLN
INTRAMUSCULAR | Status: DC | PRN
  Administered 2021-03-21: 3 mL

## 2021-03-21 MED ORDER — SITAGLIP PHOS-METFORMIN HCL ER 100-1000 MG PO TB24: 1.0000 | ORAL_TABLET | ORAL | Status: DC

## 2021-03-21 MED ORDER — ALUM & MAG HYDROXIDE-SIMETH 200-200-20 MG/5ML PO SUSP: 30.0000 mL | ORAL | Status: DC | PRN

## 2021-03-21 MED ORDER — METHOCARBAMOL 1000 MG/10ML IJ SOLN
500.0000 mg | Freq: Four times a day (QID) | INTRAVENOUS | Status: DC | PRN
  Filled 2021-03-21: qty 5

## 2021-03-21 MED ORDER — AMLODIPINE BESYLATE 5 MG PO TABS
5.0000 mg | ORAL_TABLET | Freq: Every day | ORAL | Status: DC
  Administered 2021-03-21: 5 mg via ORAL
  Filled 2021-03-21 (×2): qty 1

## 2021-03-21 MED ORDER — ONDANSETRON HCL 4 MG/2ML IJ SOLN
4.0000 mg | Freq: Once | INTRAMUSCULAR | Status: AC | PRN
  Administered 2021-03-21: 4 mg via INTRAVENOUS

## 2021-03-21 MED ORDER — METHOCARBAMOL 500 MG PO TABS
500.0000 mg | ORAL_TABLET | Freq: Four times a day (QID) | ORAL | Status: DC | PRN
  Administered 2021-03-21: 500 mg via ORAL
  Filled 2021-03-21: qty 1

## 2021-03-21 MED ORDER — METFORMIN HCL ER 500 MG PO TB24
1000.0000 mg | ORAL_TABLET | ORAL | Status: DC
  Administered 2021-03-22: 1000 mg via ORAL
  Filled 2021-03-21: qty 2

## 2021-03-21 MED ORDER — OXYCODONE HCL 5 MG PO TABS: 5.0000 mg | ORAL_TABLET | ORAL | Status: DC | PRN

## 2021-03-21 MED ORDER — DIPHENHYDRAMINE HCL 12.5 MG/5ML PO ELIX: 12.5000 mg | ORAL_SOLUTION | ORAL | Status: DC | PRN

## 2021-03-21 MED ORDER — HYDROMORPHONE HCL 1 MG/ML IJ SOLN: 0.5000 mg | INTRAMUSCULAR | Status: DC | PRN

## 2021-03-21 MED ORDER — CHLORHEXIDINE GLUCONATE 0.12 % MT SOLN
OROMUCOSAL | Status: AC
  Administered 2021-03-21: 15 mL via OROMUCOSAL
  Filled 2021-03-21: qty 15

## 2021-03-21 MED ORDER — LATANOPROST 0.005 % OP SOLN
1.0000 [drp] | Freq: Every day | OPHTHALMIC | Status: DC
  Administered 2021-03-21: 1 [drp] via OPHTHALMIC
  Filled 2021-03-21: qty 2.5

## 2021-03-21 MED ORDER — PROPOFOL 500 MG/50ML IV EMUL
INTRAVENOUS | Status: DC | PRN
  Administered 2021-03-21: 50 ug/kg/min via INTRAVENOUS

## 2021-03-21 MED ORDER — METOCLOPRAMIDE HCL 10 MG PO TABS: 5.0000 mg | ORAL_TABLET | Freq: Three times a day (TID) | ORAL | Status: DC | PRN

## 2021-03-21 MED ORDER — TRANEXAMIC ACID-NACL 1000-0.7 MG/100ML-% IV SOLN
1000.0000 mg | Freq: Once | INTRAVENOUS | Status: AC
  Administered 2021-03-21: 1000 mg via INTRAVENOUS
  Filled 2021-03-21: qty 100

## 2021-03-21 MED ORDER — ACETAMINOPHEN 500 MG PO TABS
1000.0000 mg | ORAL_TABLET | Freq: Four times a day (QID) | ORAL | Status: AC
  Administered 2021-03-21 – 2021-03-22 (×4): 1000 mg via ORAL
  Filled 2021-03-21 (×4): qty 2

## 2021-03-21 SURGICAL SUPPLY — 61 items
ATTUNE MED DOME PAT 41 KNEE (Knees) ×2 IMPLANT
ATTUNE PS FEM RT SZ 7 CEM KNEE (Femur) ×2 IMPLANT
BANDAGE ESMARK 6X9 LF (GAUZE/BANDAGES/DRESSINGS) ×1 IMPLANT
BASE TIBIAL CEM ATTUNE SZ 7 (Knees) ×2 IMPLANT
BASEPLATE TIB CEM ATTUNE SZ7 (Knees) ×1 IMPLANT
BLADE SAGITTAL 25.0X1.27X90 (BLADE) ×2 IMPLANT
BLADE SAW SAG 90X13X1.27 (BLADE) ×2 IMPLANT
BLADE SURG SZ10 CARB STEEL (BLADE) ×2 IMPLANT
BNDG ESMARK 6X9 LF (GAUZE/BANDAGES/DRESSINGS) ×2
CEMENT HV SMART SET (Cement) ×4 IMPLANT
CLOTH BEACON ORANGE TIMEOUT ST (SAFETY) ×2 IMPLANT
COOLER ICEMAN CLASSIC (MISCELLANEOUS) ×2 IMPLANT
COVER LIGHT HANDLE STERIS (MISCELLANEOUS) ×4 IMPLANT
CUFF TOURN SGL QUICK 34 (TOURNIQUET CUFF) ×2
CUFF TRNQT CYL 34X4.125X (TOURNIQUET CUFF) ×1 IMPLANT
DRAPE BACK TABLE (DRAPES) ×2 IMPLANT
DRAPE EXTREMITY T 121X128X90 (DISPOSABLE) ×2 IMPLANT
DRAPE INCISE IOBAN 66X45 STRL (DRAPES) ×2 IMPLANT
DRESSING AQUACEL AG ADV 3.5X12 (MISCELLANEOUS) ×1 IMPLANT
DRSG AQUACEL AG ADV 3.5X12 (MISCELLANEOUS) ×2
DURAPREP 26ML APPLICATOR (WOUND CARE) ×4 IMPLANT
ELECT REM PT RETURN 9FT ADLT (ELECTROSURGICAL) ×2
ELECTRODE REM PT RTRN 9FT ADLT (ELECTROSURGICAL) ×1 IMPLANT
GLOVE SS N UNI LF 8.5 STRL (GLOVE) ×2 IMPLANT
GLOVE SURG POLYISO LF SZ8 (GLOVE) ×4 IMPLANT
GLOVE SURG UNDER POLY LF SZ7 (GLOVE) ×4 IMPLANT
GOWN STRL REUS W/TWL LRG LVL3 (GOWN DISPOSABLE) ×6 IMPLANT
GOWN STRL REUS W/TWL XL LVL3 (GOWN DISPOSABLE) ×2 IMPLANT
HANDPIECE INTERPULSE COAX TIP (DISPOSABLE) ×2
HOOD W/PEELAWAY (MISCELLANEOUS) ×8 IMPLANT
INSERT TIB ATTUNE FB SZ7X10 (Insert) ×2 IMPLANT
INST SET MAJOR BONE (KITS) ×2 IMPLANT
IV NS IRRIG 3000ML ARTHROMATIC (IV SOLUTION) ×2 IMPLANT
KIT BLADEGUARD II DBL (SET/KITS/TRAYS/PACK) ×2 IMPLANT
KIT TURNOVER KIT A (KITS) ×2 IMPLANT
MANIFOLD NEPTUNE II (INSTRUMENTS) ×2 IMPLANT
MARKER SKIN DUAL TIP RULER LAB (MISCELLANEOUS) ×2 IMPLANT
NEEDLE HYPO 21X1.5 SAFETY (NEEDLE) ×2 IMPLANT
NS IRRIG 1000ML POUR BTL (IV SOLUTION) ×2 IMPLANT
PACK TOTAL JOINT (CUSTOM PROCEDURE TRAY) ×2 IMPLANT
PAD ARMBOARD 7.5X6 YLW CONV (MISCELLANEOUS) ×2 IMPLANT
PAD COLD SHLDR WRAP-ON (PAD) ×2 IMPLANT
PENCIL SMOKE EVACUATOR (MISCELLANEOUS) ×2 IMPLANT
PILLOW KNEE EXTENSION 0 DEG (MISCELLANEOUS) ×2 IMPLANT
PIN/DRILL PACK ORTHO 1/8X3.0 (PIN) ×2 IMPLANT
SAW OSC TIP CART 19.5X105X1.3 (SAW) ×2 IMPLANT
SET BASIN LINEN APH (SET/KITS/TRAYS/PACK) ×2 IMPLANT
SET HNDPC FAN SPRY TIP SCT (DISPOSABLE) ×1 IMPLANT
SPONGE T-LAP 18X18 ~~LOC~~+RFID (SPONGE) ×6 IMPLANT
STAPLER VISISTAT 35W (STAPLE) ×2 IMPLANT
SUT BRALON NAB BRD #1 30IN (SUTURE) ×4 IMPLANT
SUT MNCRL 0 VIOLET CTX 36 (SUTURE) ×1 IMPLANT
SUT MON AB 0 CT1 (SUTURE) ×2 IMPLANT
SUT MONOCRYL 0 CTX 36 (SUTURE) ×2
SYR 20ML LL LF (SYRINGE) ×2 IMPLANT
SYR BULB IRRIG 60ML STRL (SYRINGE) ×2 IMPLANT
TOWEL OR 17X26 4PK STRL BLUE (TOWEL DISPOSABLE) ×2 IMPLANT
TOWER CARTRIDGE SMART MIX (DISPOSABLE) ×2 IMPLANT
TRAY FOLEY MTR SLVR 16FR STAT (SET/KITS/TRAYS/PACK) ×2 IMPLANT
WATER STERILE IRR 1000ML POUR (IV SOLUTION) ×4 IMPLANT
YANKAUER SUCT 12FT TUBE ARGYLE (SUCTIONS) ×2 IMPLANT

## 2021-03-21 NOTE — Anesthesia Procedure Notes (Signed)
Spinal  Patient location during procedure: OR Start time: 03/21/2021 7:32 AM End time: 03/21/2021 7:36 AM Staffing Performed: resident/CRNA  Anesthesiologist: [name], MD Resident/CRNA: Tacy Learn, CRNA Preanesthetic Checklist Completed: patient identified, IV checked, site marked, risks and benefits discussed, surgical consent, monitors and equipment checked, pre-op evaluation and timeout performed Spinal Block Patient position: sitting Prep: ChloraPrep Patient monitoring: heart rate, cardiac monitor, continuous pulse ox and blood pressure Approach: right paramedian Location: L4-5 Injection technique: single-shot Needle Needle type: Sprotte  Needle gauge: 22 G Needle length: 10 cm Assessment Sensory level: T4 Events: CSF return Additional Notes Pt tolerated well

## 2021-03-21 NOTE — Op Note (Signed)
03/21/2021  10:19 AM  PATIENT:  Terry Case  55 y.o. male  PRE-OPERATIVE DIAGNOSIS:  Right knee osteoarthritis  POST-OPERATIVE DIAGNOSIS:  Right knee osteoarthritis  PROCEDURE:  Procedure(s): TOTAL KNEE ARTHROPLASTY (Right) (Tibial condylar reduction osteotomy) Extensive medial and posteromedial release   Findings External rotation deformity of the proximal tibia mild varus deformity 8 degree flexion contracture mild to moderate arthritis patella severe arthritis medial femoral compartment large osteophytes large loose body  Implants 7 femur 7 tibia 10 polyethylene insert Patellar button 41  Distal femoral cut 11 Proximal tibial cut 3+4, medial side reference Patella 25-1/2- to 16  Details of procedure standard preop evaluation was performed patient was cleared for surgery chart review was completed and the surgical site was confirmed and marked.  Patient was taken the operating for spinal anesthesia followed by insertion of Foley catheter and then the right leg was prepped and draped sterilely  Preop evaluation revealed 8 degree flexion contracture and 85 degrees of preop knee flexion  After the timeout was completed the limb was exsanguinated with a six-inch Esmarch the tourniquet was elevated to 280 mmHg.  A midline incision was made subcutaneous tissue was divided down to the extensor mechanism.  A medial arthrotomy was performed the patella was everted the fat pad was excised the medial soft tissue sleeve was elevated to the mid coronal plane removing the medial meniscus.  The lateral meniscus anterior horn was also resected along with the ACL and PCL and an osteotome was used to open up the notch  The knee was very tight.  The patellofemoral ligament was released  3/10 drill bit was used into the femoral canal the canal was suction and irrigated.  The distal femoral cut was set for 11 mm distal resection 5 degree right.  Once this block was pinned in place the distal  femoral cut was made checked for flatness.  The tibia could not be subluxated forward at this point therefore I continue with the femoral preparation measuring a size 7 femur pending for 70 making the 4 cuts  I then was able to bring the tibia forward after further releases of the residual PCL tissue as well as osteophytes including a large posteromedial osteophytes and a large loose body.  The medial side remain tight and further release was done back to the posterior lateral corner  The tibial guide was set for neutral referencing the tibial spine medial lateral malleolus central ankle joint tibial shaft.  Once this proximal cut was made a size 5 spacer block was placed and it was tight I took an additional 4 mm cut and then use progressive spaces up to a size 8 noting medial lateral gap mismatch and trapezoidal shape.  I took further soft tissue release from the posteromedial side of the joint and the medial soft tissue sleeve down past the tibial tubercle.  I then did a trial reduction with a size 7 femur and 8 and 7 tibia.  I made the decision at this time to do a tibial reduction osteotomy and use a size 7 downsizing the tibia  I did this was an osteotome and a soft  I then was able to get balance gaps in flexion extension with a 10 mm insert  The notch cut was made trial reduction was performed with 8 and 10 mm insert.  The 10 gave the best stability well-meaning full extension  I made the proximal tibial preparation and then measured the patella cutting it from a 25-1/2  down to a 16 and using a 41 x 10-1/2 button  The joint was irrigated the bone was dried we drilled additional holes in the proximal tibia to aid cement fixation we then cemented the implants and in place once they were in place we removed excess cement and then trialed again with an 8 and a 10 mm insert.  The 10 mm insert gave the best stability and the 10 mm insert was placed  Range of motion check with passive  flexion test revealed 110 degrees of flexion and full extension and we were balanced  After thorough irrigation and drying the knee 2 vials of Zynrelef were placed into the joint the joint was closed with #1 Braylon in interrupted fashion followed by interrupted subcutaneous and subcu reticular closure with 0 Monocryl in 2 layers  Staples were used to close the skin sterile dressing was applied  The Cryo/Cuff was activated and placed along with the TED hose  Postoperatively patient will receive saphenous nerve block  SURGEON:  Surgeon(s) and Role:    * Carole Civil, MD - Primary  PHYSICIAN ASSISTANT:   ASSISTANTS: Corrie Dandy  ANESTHESIA:   spinal plus postoperative PACU saphenous nerve block  EBL:  5 mL   BLOOD ADMINISTERED:none  DRAINS: none   LOCAL MEDICATIONS USED:  OTHER Zynrelef 400 mg  SPECIMEN:  No Specimen  DISPOSITION OF SPECIMEN:  N/A  COUNTS:  YES  TOURNIQUET:   Total Tourniquet Time Documented: Thigh (Right) - 121 minutes Total: Thigh (Right) - 121 minutes   DICTATION: .Viviann Spare Dictation  PLAN OF CARE: Admit for overnight observation  PATIENT DISPOSITION:  PACU - hemodynamically stable.   Delay start of Pharmacological VTE agent (>24hrs) due to surgical blood loss or risk of bleeding: yes

## 2021-03-21 NOTE — Anesthesia Preprocedure Evaluation (Signed)
Anesthesia Evaluation  Patient identified by MRN, date of birth, ID band Patient awake    Reviewed: Allergy & Precautions, H&P , NPO status , Patient's Chart, lab work & pertinent test results, reviewed documented beta blocker date and time   Airway Mallampati: II  TM Distance: >3 FB Neck ROM: full    Dental no notable dental hx. (+) Teeth Intact   Pulmonary asthma , sleep apnea , COPD, former smoker,    Pulmonary exam normal breath sounds clear to auscultation       Cardiovascular Exercise Tolerance: Good hypertension, negative cardio ROS   Rhythm:regular Rate:Normal     Neuro/Psych negative neurological ROS  negative psych ROS   GI/Hepatic Neg liver ROS, GERD  Medicated,  Endo/Other  negative endocrine ROSdiabetes, Type 2  Renal/GU negative Renal ROS  negative genitourinary   Musculoskeletal   Abdominal   Peds  Hematology negative hematology ROS (+)   Anesthesia Other Findings   Reproductive/Obstetrics negative OB ROS                             Anesthesia Physical Anesthesia Plan  ASA: 3  Anesthesia Plan: Spinal   Post-op Pain Management:  Regional for Post-op pain   Induction:   PONV Risk Score and Plan: Propofol infusion  Airway Management Planned:   Additional Equipment:   Intra-op Plan:   Post-operative Plan:   Informed Consent: I have reviewed the patients History and Physical, chart, labs and discussed the procedure including the risks, benefits and alternatives for the proposed anesthesia with the patient or authorized representative who has indicated his/her understanding and acceptance.     Dental Advisory Given  Plan Discussed with: CRNA  Anesthesia Plan Comments:         Anesthesia Quick Evaluation

## 2021-03-21 NOTE — Interval H&P Note (Signed)
History and Physical Interval Note:  03/21/2021 7:25 AM  Terry Case  has presented today for surgery, with the diagnosis of Right knee osteoarthritis.  The various methods of treatment have been discussed with the patient and family. After consideration of risks, benefits and other options for treatment, the patient has consented to  Procedure(s): TOTAL KNEE ARTHROPLASTY (Right) as a surgical intervention.  The patient's history has been reviewed, patient examined, no change in status, stable for surgery.  I have reviewed the patient's chart and labs.  Questions were answered to the patient's satisfaction.     Arther Abbott

## 2021-03-21 NOTE — Transfer of Care (Signed)
Immediate Anesthesia Transfer of Care Note  Patient: Terry Case  Procedure(s) Performed: TOTAL KNEE ARTHROPLASTY (Right: Knee)  Patient Location: PACU  Anesthesia Type:Spinal  Level of Consciousness: awake, alert , oriented and patient cooperative  Airway & Oxygen Therapy: Patient Spontanous Breathing  Post-op Assessment: Report given to RN, Post -op Vital signs reviewed and stable and Patient moving all extremities X 4  Post vital signs: Reviewed and stable  Last Vitals:  Vitals Value Taken Time  BP 123/100 03/21/21 1030  Temp    Pulse 66 03/21/21 1029  Resp 16 03/21/21 1034  SpO2 100 % 03/21/21 1029  Vitals shown include unvalidated device data.  Last Pain:  Vitals:   03/21/21 1023  TempSrc:   PainSc: Asleep      Patients Stated Pain Goal: 6 (97/98/92 1194)  Complications: No notable events documented.

## 2021-03-21 NOTE — Brief Op Note (Signed)
03/21/2021  10:19 AM  PATIENT:  Terry Case  55 y.o. male  PRE-OPERATIVE DIAGNOSIS:  Right knee osteoarthritis  POST-OPERATIVE DIAGNOSIS:  Right knee osteoarthritis  PROCEDURE:  Procedure(s): TOTAL KNEE ARTHROPLASTY (Right) (Tibial condylar reduction osteotomy) Extensive medial and posteromedial release   Findings External rotation deformity of the proximal tibia mild varus deformity 8 degree flexion contracture mild to moderate arthritis patella severe arthritis medial femoral compartment large osteophytes large loose body  Implants 7 femur 7 tibia 10 polyethylene insert Patellar button 41  Distal femoral cut 11 Proximal tibial cut 3+4, medial side reference Patella 25-1/2-16  SURGEON:  Surgeon(s) and Role:    Carole Civil, MD - Primary  PHYSICIAN ASSISTANT:   ASSISTANTS: Corrie Dandy  ANESTHESIA:   spinal plus postoperative PACU saphenous nerve block  EBL:  5 mL   BLOOD ADMINISTERED:none  DRAINS: none   LOCAL MEDICATIONS USED:  OTHER Zynrelef 400 mg  SPECIMEN:  No Specimen  DISPOSITION OF SPECIMEN:  N/A  COUNTS:  YES  TOURNIQUET:   Total Tourniquet Time Documented: Thigh (Right) - 121 minutes Total: Thigh (Right) - 121 minutes   DICTATION: .Viviann Spare Dictation  PLAN OF CARE: Admit for overnight observation  PATIENT DISPOSITION:  PACU - hemodynamically stable.   Delay start of Pharmacological VTE agent (>24hrs) due to surgical blood loss or risk of bleeding: yes

## 2021-03-21 NOTE — Evaluation (Signed)
Physical Therapy Evaluation Patient Details Name: Terry Case MRN: [id number] DOB: [birth year] Today's Date: 03/21/2021  RIGHT KNEE ROM:  0 105 degrees AMBULATION DISTANCE: 150 feet using RW with Supervision/Mod Independent   History of Present Illness  Terry Case is a 55 y/o male, s/p Right TKA on 03/21/21, with the diagnosis of Right knee osteoarthritis.  Clinical Impression  Patient instructed in and give written instructions for HEP with good return demonstrated, able to achieve up to 105 degrees right knee flexion self stretching while seated at bedside, good endurance/distance for gait training with fair/good return for right heel to toe stepping during gait training with no loss of balance.  Patient tolerated sitting up in chair after therapy with RLE dangling.  Patient will benefit from continued physical therapy in hospital and recommended venue below to increase strength, balance, endurance for safe ADLs and gait.         Recommendations for follow up therapy are one component of a multi-disciplinary discharge planning process, led by the attending physician.  Recommendations may be updated based on patient status, additional functional criteria and insurance authorization.  Follow Up Recommendations Home health PT;Supervision for mobility/OOB;Supervision - Intermittent    Equipment Recommendations  3in1 (PT)    Recommendations for Other Services       Precautions / Restrictions Precautions Precautions: Fall Restrictions Weight Bearing Restrictions: Yes RLE Weight Bearing: Weight bearing as tolerated      Mobility  Bed Mobility Overal bed mobility: Modified Independent                  Transfers Overall transfer level: Needs assistance Equipment used: Rolling walker (2 wheeled) Transfers: Sit to/from [employer] Transfers Sit to Stand: Modified independent (Device/Increase time);Supervision Stand pivot transfers: Modified independent  (Device/Increase time);Supervision       General transfer comment: good return for sit to stands and transferring using RW  Ambulation/Gait Ambulation/Gait assistance: Supervision Gait Distance (Feet): 150 Feet Assistive device: Rolling walker (2 wheeled) Gait Pattern/deviations: Decreased step length - left;Decreased stride length;Antalgic Gait velocity: decreased   General Gait Details: good endurance/tolerance for ambulation with fair/good return for right heel to toe stepping without loss of balance  Stairs            Wheelchair Mobility    Modified Rankin (Stroke Patients Only)       Balance Overall balance assessment: Needs assistance Sitting-balance support: Feet supported;No upper extremity supported Sitting balance-Leahy Scale: Good Sitting balance - Comments: seated at EOB   Standing balance support: During functional activity;Bilateral upper extremity supported Standing balance-Leahy Scale: Fair Standing balance comment: fair/good using RW                             Pertinent Vitals/Pain Pain Assessment: 0-10 Pain Score: 5  Pain Location: right knee Pain Descriptors / Indicators: Sore Pain Intervention(s): Limited activity within patient's tolerance;Monitored during session;Premedicated before session;Repositioned    Home Living Family/patient expects to be discharged to:: Private residence Living Arrangements: Spouse/significant other Available Help at Discharge: Family;Available 24 hours/day Type of Home: Apartment Home Access: Level entry     Home Layout: One level Home Equipment: Walker - 2 wheels      Prior Function Level of Independence: Independent         Comments: Hydrographic surveyor, drives     Hand Dominance   Dominant Hand: Right    Extremity/Trunk Assessment   Upper Extremity Assessment Upper Extremity Assessment:  Overall [facility] for tasks assessed    Lower Extremity Assessment Lower Extremity Assessment:  Overall WFL for tasks assessed;RLE deficits/detail RLE Deficits / Details: grossly 4/5 RLE Sensation: WNL RLE Coordination: WNL    Cervical / Trunk Assessment Cervical / Trunk Assessment: Normal  Communication   Communication: No difficulties  Cognition Arousal/Alertness: Awake/alert Behavior During Therapy: WFL for tasks assessed/performed Overall Cognitive Status: Within Functional Limits for tasks assessed                                        General Comments      Exercises Total Joint Exercises Ankle Circles/Pumps: Supine;10 reps;Right;Strengthening;AROM Quad Sets: Supine;10 reps;Right;Strengthening;AROM Short Arc Quad: Supine;10 reps;Right;Strengthening;AROM Heel Slides: Supine;10 reps;Right;Strengthening;AROM Goniometric ROM: right knee: 0 - 105 degrees   Assessment/Plan    PT Assessment Patient needs continued PT services  PT Problem List Decreased strength;Decreased activity tolerance;Decreased balance;Decreased range of motion;Decreased mobility       PT Treatment Interventions DME instruction;Gait training;Stair training;Functional mobility training;Therapeutic activities;Therapeutic exercise;Patient/family education;Balance training    PT Goals (Current goals can be found in the Care Plan section)  Acute Rehab PT Goals Patient Stated Goal: return home with family to assist PT Goal Formulation: With patient Time For Goal Achievement: 03/22/21 Potential to Achieve Goals: Good    Frequency BID   Barriers to discharge        Co-evaluation               AM-PAC PT "6 Clicks" Mobility  Outcome Measure Help needed turning from your back to your side while in a flat bed without using bedrails?: None Help needed moving from lying on your back to sitting on the side of a flat bed without using bedrails?: None Help needed moving to and from a bed to a chair (including a wheelchair)?: None Help needed standing up from a chair using your  arms (e.g., wheelchair or bedside chair)?: None Help needed to walk in hospital room?: A Little Help needed climbing 3-5 steps with a railing? : A Little 6 Click Score: 22    End of Session   Activity Tolerance: Patient tolerated treatment well;Patient limited by fatigue Patient left: in chair;with call bell/phone within reach Nurse Communication: Mobility status PT Visit Diagnosis: Unsteadiness on feet (R26.81);Other abnormalities of gait and mobility (R26.89);Muscle weakness (generalized) (M62.81)    Time: 0354-6568 PT Time Calculation (min) (ACUTE ONLY): 31 min   Charges:   PT Evaluation $PT Eval Moderate Complexity: 1 Mod PT Treatments $Therapeutic Activity: 23-37 mins        3:07 PM, 03/21/21 Lonell Grandchild, MPT Physical Therapist with [facility] 336 [phone] office [phone] mobile phone

## 2021-03-21 NOTE — Anesthesia Postprocedure Evaluation (Signed)
Anesthesia Post Note  Patient: Terry Case  Procedure(s) Performed: TOTAL KNEE ARTHROPLASTY (Right: Knee)  Patient location during evaluation: Phase II Anesthesia Type: Spinal Level of consciousness: awake Pain management: pain level controlled Vital Signs Assessment: post-procedure vital signs reviewed and stable Respiratory status: spontaneous breathing and respiratory function stable Cardiovascular status: blood pressure returned to baseline and stable Postop Assessment: no headache and no apparent nausea or vomiting Anesthetic complications: no Comments: Late entry   No notable events documented.   Last Vitals:  Vitals:   03/21/21 1313 03/21/21 1533  BP: (!) 136/100 134/90  Pulse:  65  Resp:  16  Temp:  36.6 C  SpO2:  96%    Last Pain:  Vitals:   03/21/21 1155  TempSrc: Oral  PainSc:                  [name]

## 2021-03-21 NOTE — Anesthesia Procedure Notes (Signed)
Anesthesia Regional Block: Adductor canal block   Pre-Anesthetic Checklist: , timeout performed,  Correct Patient, Correct Site, Correct Laterality,  Correct Procedure, Correct Position, site marked,  Risks and benefits discussed,  Surgical consent,  Pre-op evaluation,  At surgeon's request and post-op pain management  Laterality: Right  Prep: chloraprep       Needles:  Injection technique: Single-shot  Needle Type: Stimiplex     Needle Length: 15cm  Needle Gauge: 22     Additional Needles:   Procedures:,,,, ultrasound used (permanent image in chart),,    Narrative:  Start time: 03/21/2021 10:27 AM End time: 03/21/2021 10:29 AM Injection made incrementally with aspirations every 5 mL.  Performed by: With CRNAs  Anesthesiologist: [name], MD CRNA: Tacy Learn, CRNA

## 2021-03-21 NOTE — Plan of Care (Signed)
  Problem: Acute Rehab PT Goals(only PT should resolve) Goal: Pt Will Go Supine/Side To Sit Outcome: Progressing Flowsheets (Taken 03/21/2021 1509) Pt will go Supine/Side to Sit: Independently Goal: Patient Will Transfer Sit To/From Stand Outcome: Progressing Flowsheets (Taken 03/21/2021 1509) Patient will transfer sit to/from stand: with modified independence Goal: Pt Will Transfer Bed To Chair/Chair To Bed Outcome: Progressing Flowsheets (Taken 03/21/2021 1509) Pt will Transfer Bed to Chair/Chair to Bed: with modified independence Goal: Pt Will Ambulate Outcome: Progressing Flowsheets (Taken 03/21/2021 1509) Pt will Ambulate:  > 125 feet  with modified independence  with rolling walker   3:09 PM, 03/21/21 Lonell Grandchild, MPT Physical Therapist with [facility] [phone] office [phone] mobile phone

## 2021-03-22 ENCOUNTER — Telehealth: Payer: Self-pay | Admitting: Orthopedic Surgery

## 2021-03-22 ENCOUNTER — Encounter (HOSPITAL_COMMUNITY): Payer: Self-pay | Admitting: Orthopedic Surgery

## 2021-03-22 DIAGNOSIS — Z79899 Other long term (current) drug therapy: Secondary | ICD-10-CM | POA: Diagnosis not present

## 2021-03-22 DIAGNOSIS — Z7984 Long term (current) use of oral hypoglycemic drugs: Secondary | ICD-10-CM | POA: Diagnosis not present

## 2021-03-22 DIAGNOSIS — Z87891 Personal history of nicotine dependence: Secondary | ICD-10-CM | POA: Diagnosis not present

## 2021-03-22 DIAGNOSIS — J45909 Unspecified asthma, uncomplicated: Secondary | ICD-10-CM | POA: Diagnosis not present

## 2021-03-22 DIAGNOSIS — I10 Essential (primary) hypertension: Secondary | ICD-10-CM | POA: Diagnosis not present

## 2021-03-22 DIAGNOSIS — E119 Type 2 diabetes mellitus without complications: Secondary | ICD-10-CM | POA: Diagnosis not present

## 2021-03-22 DIAGNOSIS — M1711 Unilateral primary osteoarthritis, right knee: Secondary | ICD-10-CM | POA: Diagnosis not present

## 2021-03-22 DIAGNOSIS — Z96651 Presence of right artificial knee joint: Secondary | ICD-10-CM | POA: Diagnosis not present

## 2021-03-22 DIAGNOSIS — J449 Chronic obstructive pulmonary disease, unspecified: Secondary | ICD-10-CM | POA: Diagnosis not present

## 2021-03-22 DIAGNOSIS — M21161 Varus deformity, not elsewhere classified, right knee: Secondary | ICD-10-CM | POA: Diagnosis not present

## 2021-03-22 LAB — GLUCOSE, CAPILLARY: Glucose-Capillary: 118 mg/dL — ABNORMAL HIGH (ref 70–99)

## 2021-03-22 LAB — BASIC METABOLIC PANEL
Anion gap: 5 (ref 5–15)
BUN: 14 mg/dL (ref 6–20)
CO2: 27 mmol/L (ref 22–32)
Calcium: 8.3 mg/dL — ABNORMAL LOW (ref 8.9–10.3)
Chloride: 102 mmol/L (ref 98–111)
Creatinine, Ser: 0.9 mg/dL (ref 0.61–1.24)
GFR, Estimated: >60 mL/min (ref >60)
Glucose, Bld: 127 mg/dL — ABNORMAL HIGH (ref 70–99)
Potassium: 4.2 mmol/L (ref 3.5–5.1)
Sodium: 134 mmol/L — ABNORMAL LOW (ref 135–145)

## 2021-03-22 LAB — CBC
HCT: 35.5 % — ABNORMAL LOW (ref 39.0–52.0)
Hemoglobin: 10.9 g/dL — ABNORMAL LOW (ref 13.0–17.0)
MCH: 26.9 pg (ref 26.0–34.0)
MCHC: 30.7 g/dL (ref 30.0–36.0)
MCV: 87.7 fL (ref 80.0–100.0)
Platelets: 205 10*3/uL (ref 150–400)
RBC: 4.05 MIL/uL — ABNORMAL LOW (ref 4.22–5.81)
RDW: 14.5 % (ref 11.5–15.5)
WBC: 7.2 10*3/uL (ref 4.0–10.5)
nRBC: 0 % (ref 0.0–0.2)

## 2021-03-22 MED ORDER — CELECOXIB 200 MG PO CAPS: 200.0000 mg | ORAL_CAPSULE | Freq: Every day | ORAL | 0 refills | Status: DC

## 2021-03-22 MED ORDER — OXYCODONE HCL 5 MG PO TABS: 5.0000 mg | ORAL_TABLET | ORAL | 0 refills | Status: DC

## 2021-03-22 MED ORDER — TRAMADOL HCL 50 MG PO TABS: 50.0000 mg | ORAL_TABLET | Freq: Four times a day (QID) | ORAL | 0 refills | Status: AC

## 2021-03-22 MED ORDER — ASPIRIN 325 MG PO TBEC: 325.0000 mg | DELAYED_RELEASE_TABLET | Freq: Every day | ORAL | 0 refills | Status: DC

## 2021-03-22 MED ORDER — 3-IN-1 BEDSIDE TOILET MISC: 0 refills | Status: AC

## 2021-03-22 MED ORDER — METHOCARBAMOL 500 MG PO TABS: 500.0000 mg | ORAL_TABLET | Freq: Four times a day (QID) | ORAL | 1 refills | Status: DC | PRN

## 2021-03-22 NOTE — Telephone Encounter (Signed)
I called her, he has walker, sending 3 in one now. To [facility]  To you [employer]

## 2021-03-22 NOTE — Discharge Summary (Signed)
Physician Discharge Summary  Patient ID: Terry Case MRN: [id number] DOB/AGE: 57/12/1965 57 y.o.  Admit date: 03/21/2021 Discharge date: 03/22/2021  Admission Diagnoses: Primary osteoarthritis right knee  Discharge Diagnoses: Same  Discharged Condition: Able  Procedure: Right total knee Attune fixed-bearing posterior stabilized  Hospital Course: [facility] course patient tolerated procedure well under spinal anesthetic with postop saphenous nerve block Intra-Op Zynrelef  Ambulated well had no trouble with his therapy RIGHT KNEE ROM:  0 105 degrees AMBULATION DISTANCE: 150 feet using RW with Supervision/Mod Independent  CBC Latest Ref Rng & Units 03/22/2021 03/17/2021 07/31/2018  WBC 4.0 - 10.5 K/uL 7.2 4.3 5.2  Hemoglobin 13.0 - 17.0 g/dL 10.9(L) 12.3(L) 12.7(L)  Hematocrit 39.0 - 52.0 % 35.5(L) 39.6 40.9  Platelets 150 - 400 K/uL 205 226 261   BMP Latest Ref Rng & Units 03/22/2021 03/17/2021 07/31/2018  Glucose 70 - 99 mg/dL 127(H) 95 106(H)  BUN 6 - 20 mg/dL 14 21(H) 21(H)  Creatinine 0.61 - 1.24 mg/dL 0.90 0.92 1.02  Sodium 135 - 145 mmol/L 134(L) 137 134(L)  Potassium 3.5 - 5.1 mmol/L 4.2 4.3 4.0  Chloride 98 - 111 mmol/L 102 107 102  CO2 22 - 32 mmol/L 27 23 23   Calcium 8.9 - 10.3 mg/dL 8.3(L) 8.4(L) 9.3          Discharge Exam: BP (!) 141/78 (BP Location: Left Arm)   Pulse 91   Temp 99.9 F (37.7 C) (Oral)   Resp 18   Ht 5\' 8"  (1.727 m)   Wt 118.4 kg   SpO2 94%   BMI 39.68 kg/m  Physical Exam  Neurovascular exam is intact.  Mental status baseline.  Calf supple soft.  Disposition:    Allergies as of 03/22/2021       Reactions   Fish Allergy Hives, Swelling   Tomato Hives, Swelling   Vicodin [hydrocodone-acetaminophen] Itching, Rash   Can take Vicodin as long as he takes benadryl         Medication List     STOP taking these medications    aspirin 81 MG tablet Replaced by: aspirin 325 MG EC tablet    HYDROcodone-acetaminophen 5-325 MG tablet Commonly known as: NORCO/VICODIN       TAKE these medications    albuterol 108 (90 Base) MCG/ACT inhaler Commonly known as: VENTOLIN HFA INHALE 2 PUFFS BY MOUTH EVERY 4 HOURS AS NEEDED IF YOU CAN'T CATCH YOUR BREATH   amLODipine 5 MG tablet Commonly known as: NORVASC Take 5 mg by mouth daily.   aspirin 325 MG EC tablet Take 1 tablet (325 mg total) by mouth daily with breakfast. Replaces: aspirin 81 MG tablet   atorvastatin 20 MG tablet Commonly known as: LIPITOR Take 20 mg by mouth at bedtime.   celecoxib 200 MG capsule Commonly known as: CELEBREX Take 1 capsule (200 mg total) by mouth daily.   diphenhydrAMINE 25 MG tablet Commonly known as: BENADRYL Take 50 mg by mouth in the morning.   dorzolamide 2 % ophthalmic solution Commonly known as: TRUSOPT Place 1 drop into both eyes 2 (two) times daily.   famotidine 20 MG tablet Commonly known as: PEPCID Take 20 mg by mouth daily.   halobetasol 0.05 % cream Commonly known as: ULTRAVATE Apply 1 application topically 2 (two) times daily as needed (eczema).   ibuprofen 800 MG tablet Commonly known as: ADVIL TAKE 1 TABLET(800 MG) BY MOUTH EVERY 8 HOURS AS NEEDED   Janumet XR 202 474 4008 MG Tb24 Generic drug: SitaGLIPtin-MetFORMIN HCl  Take 1 tablet by mouth every morning.   methocarbamol 500 MG tablet Commonly known as: ROBAXIN Take 1 tablet (500 mg total) by mouth every 6 (six) hours as needed for muscle spasms.   oxyCODONE 5 MG immediate release tablet Commonly known as: Oxy IR/ROXICODONE Take 1 tablet (5 mg total) by mouth every 4 (four) hours.   Rocklatan 0.02-0.005 % Soln Generic drug: Netarsudil-Latanoprost Place 1 drop into both eyes at bedtime.   traMADol 50 MG tablet Commonly known as: ULTRAM Take 1 tablet (50 mg total) by mouth every 6 (six) hours for 7 days.         Signed: Arther Abbott 03/22/2021, 7:48 AM

## 2021-03-22 NOTE — Progress Notes (Signed)
Physical Therapy Treatment Patient Details Name: Terry Case MRN: [id number] DOB: 02/01/1966 Today's Date: 03/22/2021  RIGHT KNEE ROM:  0 - 110 degrees AMBULATION DISTANCE: 200 feet using RW with Modified Independent   History of Present Illness REN ASPINALL is a 55 y/o male, s/p Right TKA on 03/21/21, with the diagnosis of Right knee osteoarthritis.    PT Comments    Patient demonstrates increased endurance/distance for ambulation with fair/good return for right heel to toe stepping without loss of balance,  good return for going up/down steps in stairwell using bilateral side rails with spouse present for family training and understanding acknowleged by patient and his spouse for proper step sequence and position of helper.  Patient tolerated sitting up at bedside after therapy with his spouse present in room.  Patient will benefit from continued physical therapy in hospital and recommended venue below to increase strength, balance, endurance for safe ADLs and gait.    Recommendations for follow up therapy are one component of a multi-disciplinary discharge planning process, led by the attending physician.  Recommendations may be updated based on patient status, additional functional criteria and insurance authorization.  Follow Up Recommendations  Home health PT;Supervision for mobility/OOB;Supervision - Intermittent     Equipment Recommendations  3in1 (PT)    Recommendations for Other Services       Precautions / Restrictions Precautions Precautions: Fall Restrictions Weight Bearing Restrictions: Yes RLE Weight Bearing: Weight bearing as tolerated     Mobility  Bed Mobility Overal bed mobility: Independent                  Transfers Overall transfer level: Modified independent Equipment used: Rolling walker (2 wheeled)   Sit to Stand: Modified independent (Device/Increase time) Stand pivot transfers: Modified independent (Device/Increase time)        General transfer comment: good return for sit to stands and transfers using RW  Ambulation/Gait Ambulation/Gait assistance: Modified independent (Device/Increase time) Gait Distance (Feet): 200 Feet Assistive device: Rolling walker (2 wheeled) Gait Pattern/deviations: Decreased step length - left;Decreased stride length;Antalgic Gait velocity: decreased   General Gait Details: good return for right heel to toe stepping during ambulation without loss of balance   Stairs Stairs: Yes Stairs assistance: Modified independent (Device/Increase time) Stair Management: Step to pattern;Two rails Number of Stairs: 5 General stair comments: demonstrates good return for going up/down steps in stairwell without loss of balance with spouse present for family training and understanding acknowleged by patient and his spouse   Wheelchair Mobility    Modified Rankin (Stroke Patients Only)       Balance Overall balance assessment: Modified Independent Sitting-balance support: Feet supported;No upper extremity supported Sitting balance-Leahy Scale: Good Sitting balance - Comments: seated at EOB   Standing balance support: During functional activity;Bilateral upper extremity supported Standing balance-Leahy Scale: Good Standing balance comment: using RW                            Cognition Arousal/Alertness: Awake/alert Behavior During Therapy: WFL for tasks assessed/performed Overall Cognitive Status: Within Functional Limits for tasks assessed                                        Exercises      General Comments        Pertinent Vitals/Pain Pain Score: 7  Pain Location: right knee  Pain Descriptors / Indicators: Sore Pain Intervention(s): Limited activity within patient's tolerance;Monitored during session;Repositioned    Home Living                      Prior Function            PT Goals (current goals can now be found in the care  plan section) Acute Rehab PT Goals Patient Stated Goal: return home with family to assist PT Goal Formulation: With patient/family Time For Goal Achievement: 03/22/21 Potential to Achieve Goals: Good Progress towards PT goals: Progressing toward goals    Frequency    BID      PT Plan Current plan remains appropriate    Co-evaluation              AM-PAC PT "6 Clicks" Mobility   Outcome Measure  Help needed turning from your back to your side while in a flat bed without using bedrails?: None Help needed moving from lying on your back to sitting on the side of a flat bed without using bedrails?: None Help needed moving to and from a bed to a chair (including a wheelchair)?: None Help needed standing up from a chair using your arms (e.g., wheelchair or bedside chair)?: None Help needed to walk in hospital room?: A Little Help needed climbing 3-5 steps with a railing? : A Little 6 Click Score: 22    End of Session   Activity Tolerance: Patient tolerated treatment well Patient left: in bed;with call bell/phone within reach;with family/visitor present (left seated at bedside with spouse in room) Nurse Communication: Mobility status PT Visit Diagnosis: Unsteadiness on feet (R26.81);Other abnormalities of gait and mobility (R26.89);Muscle weakness (generalized) (M62.81)     Time: 6378-5885 PT Time Calculation (min) (ACUTE ONLY): 17 min  Charges:  $Gait Training: 8-22 mins $Therapeutic Activity: 8-22 mins                     9:31 AM, 03/22/21 Lonell Grandchild, MPT Physical Therapist with [facility] [phone] office [phone] mobile phone

## 2021-03-22 NOTE — Progress Notes (Signed)
Patient ID: DEMITRIOS MOLYNEUX, male   DOB: 1965-08-19, 55 y.o.   MRN: [id number] POD 1  Status post right total knee  Patient is anxious to go home did well with physical therapy yesterday  BP (!) 141/78 (BP Location: Left Arm)   Pulse 91   Temp 99.9 F (37.7 C) (Oral)   Resp 18   Ht 5\' 8"  (1.727 m)   Wt 118.4 kg   SpO2 94%   BMI 39.68 kg/m    Neurovascular exam is intact patient is awake alert and oriented x3   CBC Latest Ref Rng & Units 03/22/2021 03/17/2021 07/31/2018  WBC 4.0 - 10.5 K/uL 7.2 4.3 5.2  Hemoglobin 13.0 - 17.0 g/dL 10.9(L) 12.3(L) 12.7(L)  Hematocrit 39.0 - 52.0 % 35.5(L) 39.6 40.9  Platelets 150 - 400 K/uL 205 226 261    BMET    Component Value Date/Time   NA 134 (L) 03/22/2021 0453   K 4.2 03/22/2021 0453   CL 102 03/22/2021 0453   CO2 27 03/22/2021 0453   GLUCOSE 127 (H) 03/22/2021 0453   BUN 14 03/22/2021 0453   CREATININE 0.90 03/22/2021 0453   CALCIUM 8.3 (L) 03/22/2021 0453   GFRNONAA >60 03/22/2021 0453   GFRAA >60 07/31/2018 1338

## 2021-03-22 NOTE — TOC Transition Note (Signed)
Transition of Care [facility]) - CM/SW Discharge Note   Patient Details  Name: Terry Case MRN: [id number] Date of Birth: 1965-10-06  Transition of Care [facility]) CM/SW Contact:  Iona Beard, [place] Phone Number: 03/22/2021, 10:15 AM   Clinical Narrative:    [facility] consulted for HH/DME needs. CSW spoke to Geraldine with [facility] who has accepted referral for [facility] services for pt. CSW spoke with Amy at Dr. Ruthe Mannan office who has sent referral over to [place] for a walker for pt. CSW also asked that they send an order for a 3n1 as this was recommended by PT. Amy states she will send that order over for pt. TOC signing off.   Final next level of care: [place] Barriers to Discharge: No Barriers Identified   Patient Goals and CMS Choice Patient states their goals for this hospitalization and ongoing recovery are:: Home with [facility] CMS Medicare.gov Compare Post Acute Care list provided to:: Patient Choice offered to / list presented to : Patient  Discharge Placement                       Discharge Plan and Services                                     Social Determinants of Health (SDOH) Interventions     Readmission Risk Interventions No flowsheet data found.

## 2021-03-22 NOTE — Telephone Encounter (Signed)
Call received from [facility], social worker, ph# [phone], asking if Dr Aline Brochure is ordering a rolling walker and also a 3 in 1.  Please advise.

## 2021-03-23 DIAGNOSIS — J449 Chronic obstructive pulmonary disease, unspecified: Secondary | ICD-10-CM | POA: Diagnosis not present

## 2021-03-23 DIAGNOSIS — K219 Gastro-esophageal reflux disease without esophagitis: Secondary | ICD-10-CM | POA: Diagnosis not present

## 2021-03-23 DIAGNOSIS — L309 Dermatitis, unspecified: Secondary | ICD-10-CM | POA: Diagnosis not present

## 2021-03-23 DIAGNOSIS — Z7984 Long term (current) use of oral hypoglycemic drugs: Secondary | ICD-10-CM | POA: Diagnosis not present

## 2021-03-23 DIAGNOSIS — I10 Essential (primary) hypertension: Secondary | ICD-10-CM | POA: Diagnosis not present

## 2021-03-23 DIAGNOSIS — G4733 Obstructive sleep apnea (adult) (pediatric): Secondary | ICD-10-CM | POA: Diagnosis not present

## 2021-03-23 DIAGNOSIS — G8929 Other chronic pain: Secondary | ICD-10-CM | POA: Diagnosis not present

## 2021-03-23 DIAGNOSIS — Z87891 Personal history of nicotine dependence: Secondary | ICD-10-CM | POA: Diagnosis not present

## 2021-03-23 DIAGNOSIS — M549 Dorsalgia, unspecified: Secondary | ICD-10-CM | POA: Diagnosis not present

## 2021-03-23 DIAGNOSIS — Z96651 Presence of right artificial knee joint: Secondary | ICD-10-CM | POA: Diagnosis not present

## 2021-03-23 DIAGNOSIS — M75101 Unspecified rotator cuff tear or rupture of right shoulder, not specified as traumatic: Secondary | ICD-10-CM | POA: Diagnosis not present

## 2021-03-23 DIAGNOSIS — E119 Type 2 diabetes mellitus without complications: Secondary | ICD-10-CM | POA: Diagnosis not present

## 2021-03-23 DIAGNOSIS — M1711 Unilateral primary osteoarthritis, right knee: Secondary | ICD-10-CM | POA: Diagnosis not present

## 2021-03-23 DIAGNOSIS — Z7982 Long term (current) use of aspirin: Secondary | ICD-10-CM | POA: Diagnosis not present

## 2021-03-23 DIAGNOSIS — Z471 Aftercare following joint replacement surgery: Secondary | ICD-10-CM | POA: Diagnosis not present

## 2021-03-26 DIAGNOSIS — Z471 Aftercare following joint replacement surgery: Secondary | ICD-10-CM | POA: Diagnosis not present

## 2021-03-26 DIAGNOSIS — M75101 Unspecified rotator cuff tear or rupture of right shoulder, not specified as traumatic: Secondary | ICD-10-CM | POA: Diagnosis not present

## 2021-03-26 DIAGNOSIS — Z96651 Presence of right artificial knee joint: Secondary | ICD-10-CM | POA: Diagnosis not present

## 2021-03-26 DIAGNOSIS — L309 Dermatitis, unspecified: Secondary | ICD-10-CM | POA: Diagnosis not present

## 2021-03-26 DIAGNOSIS — M549 Dorsalgia, unspecified: Secondary | ICD-10-CM | POA: Diagnosis not present

## 2021-03-26 DIAGNOSIS — G8929 Other chronic pain: Secondary | ICD-10-CM | POA: Diagnosis not present

## 2021-03-26 DIAGNOSIS — I10 Essential (primary) hypertension: Secondary | ICD-10-CM | POA: Diagnosis not present

## 2021-03-26 DIAGNOSIS — Z7984 Long term (current) use of oral hypoglycemic drugs: Secondary | ICD-10-CM | POA: Diagnosis not present

## 2021-03-26 DIAGNOSIS — G4733 Obstructive sleep apnea (adult) (pediatric): Secondary | ICD-10-CM | POA: Diagnosis not present

## 2021-03-26 DIAGNOSIS — Z87891 Personal history of nicotine dependence: Secondary | ICD-10-CM | POA: Diagnosis not present

## 2021-03-26 DIAGNOSIS — M1711 Unilateral primary osteoarthritis, right knee: Secondary | ICD-10-CM | POA: Diagnosis not present

## 2021-03-26 DIAGNOSIS — Z7982 Long term (current) use of aspirin: Secondary | ICD-10-CM | POA: Diagnosis not present

## 2021-03-26 DIAGNOSIS — E119 Type 2 diabetes mellitus without complications: Secondary | ICD-10-CM | POA: Diagnosis not present

## 2021-03-26 DIAGNOSIS — K219 Gastro-esophageal reflux disease without esophagitis: Secondary | ICD-10-CM | POA: Diagnosis not present

## 2021-03-26 DIAGNOSIS — J449 Chronic obstructive pulmonary disease, unspecified: Secondary | ICD-10-CM | POA: Diagnosis not present

## 2021-03-28 DIAGNOSIS — Z471 Aftercare following joint replacement surgery: Secondary | ICD-10-CM | POA: Diagnosis not present

## 2021-03-28 DIAGNOSIS — M1711 Unilateral primary osteoarthritis, right knee: Secondary | ICD-10-CM | POA: Diagnosis not present

## 2021-03-28 DIAGNOSIS — J449 Chronic obstructive pulmonary disease, unspecified: Secondary | ICD-10-CM | POA: Diagnosis not present

## 2021-03-28 DIAGNOSIS — E119 Type 2 diabetes mellitus without complications: Secondary | ICD-10-CM | POA: Diagnosis not present

## 2021-03-28 DIAGNOSIS — G4733 Obstructive sleep apnea (adult) (pediatric): Secondary | ICD-10-CM | POA: Diagnosis not present

## 2021-03-28 DIAGNOSIS — Z7984 Long term (current) use of oral hypoglycemic drugs: Secondary | ICD-10-CM | POA: Diagnosis not present

## 2021-03-28 DIAGNOSIS — L309 Dermatitis, unspecified: Secondary | ICD-10-CM | POA: Diagnosis not present

## 2021-03-28 DIAGNOSIS — K219 Gastro-esophageal reflux disease without esophagitis: Secondary | ICD-10-CM | POA: Diagnosis not present

## 2021-03-28 DIAGNOSIS — M549 Dorsalgia, unspecified: Secondary | ICD-10-CM | POA: Diagnosis not present

## 2021-03-28 DIAGNOSIS — Z87891 Personal history of nicotine dependence: Secondary | ICD-10-CM | POA: Diagnosis not present

## 2021-03-28 DIAGNOSIS — G8929 Other chronic pain: Secondary | ICD-10-CM | POA: Diagnosis not present

## 2021-03-28 DIAGNOSIS — Z96651 Presence of right artificial knee joint: Secondary | ICD-10-CM | POA: Diagnosis not present

## 2021-03-28 DIAGNOSIS — Z7982 Long term (current) use of aspirin: Secondary | ICD-10-CM | POA: Diagnosis not present

## 2021-03-28 DIAGNOSIS — M75101 Unspecified rotator cuff tear or rupture of right shoulder, not specified as traumatic: Secondary | ICD-10-CM | POA: Diagnosis not present

## 2021-03-28 DIAGNOSIS — I10 Essential (primary) hypertension: Secondary | ICD-10-CM | POA: Diagnosis not present

## 2021-03-30 ENCOUNTER — Other Ambulatory Visit: Payer: Self-pay | Admitting: Radiology

## 2021-03-30 ENCOUNTER — Telehealth: Payer: Self-pay | Admitting: Orthopedic Surgery

## 2021-03-30 ENCOUNTER — Ambulatory Visit (HOSPITAL_COMMUNITY)
Admission: RE | Admit: 2021-03-30 | Discharge: 2021-03-30 | Disposition: A | Discharge status: Home / Self Care | Payer: Medicare Other | Source: Ambulatory Visit | Attending: Orthopedic Surgery | Admitting: Orthopedic Surgery

## 2021-03-30 ENCOUNTER — Other Ambulatory Visit: Payer: Self-pay

## 2021-03-30 DIAGNOSIS — Z96651 Presence of right artificial knee joint: Secondary | ICD-10-CM | POA: Insufficient documentation

## 2021-03-30 DIAGNOSIS — I10 Essential (primary) hypertension: Secondary | ICD-10-CM | POA: Diagnosis not present

## 2021-03-30 DIAGNOSIS — M549 Dorsalgia, unspecified: Secondary | ICD-10-CM | POA: Diagnosis not present

## 2021-03-30 DIAGNOSIS — Z7982 Long term (current) use of aspirin: Secondary | ICD-10-CM | POA: Diagnosis not present

## 2021-03-30 DIAGNOSIS — J449 Chronic obstructive pulmonary disease, unspecified: Secondary | ICD-10-CM | POA: Diagnosis not present

## 2021-03-30 DIAGNOSIS — Z7984 Long term (current) use of oral hypoglycemic drugs: Secondary | ICD-10-CM | POA: Diagnosis not present

## 2021-03-30 DIAGNOSIS — G8929 Other chronic pain: Secondary | ICD-10-CM | POA: Diagnosis not present

## 2021-03-30 DIAGNOSIS — L309 Dermatitis, unspecified: Secondary | ICD-10-CM | POA: Diagnosis not present

## 2021-03-30 DIAGNOSIS — E119 Type 2 diabetes mellitus without complications: Secondary | ICD-10-CM | POA: Diagnosis not present

## 2021-03-30 DIAGNOSIS — M75101 Unspecified rotator cuff tear or rupture of right shoulder, not specified as traumatic: Secondary | ICD-10-CM | POA: Diagnosis not present

## 2021-03-30 DIAGNOSIS — G4733 Obstructive sleep apnea (adult) (pediatric): Secondary | ICD-10-CM | POA: Diagnosis not present

## 2021-03-30 DIAGNOSIS — M1711 Unilateral primary osteoarthritis, right knee: Secondary | ICD-10-CM | POA: Diagnosis not present

## 2021-03-30 DIAGNOSIS — Z471 Aftercare following joint replacement surgery: Secondary | ICD-10-CM | POA: Diagnosis not present

## 2021-03-30 DIAGNOSIS — Z87891 Personal history of nicotine dependence: Secondary | ICD-10-CM | POA: Diagnosis not present

## 2021-03-30 DIAGNOSIS — R600 Localized edema: Secondary | ICD-10-CM | POA: Diagnosis not present

## 2021-03-30 DIAGNOSIS — K219 Gastro-esophageal reflux disease without esophagitis: Secondary | ICD-10-CM | POA: Diagnosis not present

## 2021-03-30 NOTE — Telephone Encounter (Signed)
I called central scheduling, patient can go to [facility] now and they will work him in for the doppler.  I called patient and advised, he will go on tho hospital.  They will not hold pt after study and will call results to me.

## 2021-03-30 NOTE — Telephone Encounter (Signed)
Done

## 2021-03-30 NOTE — Telephone Encounter (Signed)
Call via voice message left during non-clinic time today, 03/30/21, 12:50pm, from [facility], physical therapist, who relays that she has been out to see patient, and that she has concerns about a possible clot, lower right leg. States he can walk but is showing some potential for a blood clot. Her number is [phone].* *Just received a call from patient, 2:28pm, relaying that the therapist asked him to also call. Relayed I would send immediate message and that he will be further advised

## 2021-03-30 NOTE — Telephone Encounter (Signed)
IMPRESSION: No lower extremity DVT  I called patient to advise Made sure he is taking ASA he states he is and will continue He is not wearing his support hose, I told him to wear them, put them on in the morning, and wear all day, he voiced understanding  To you FYI

## 2021-04-03 DIAGNOSIS — Z87891 Personal history of nicotine dependence: Secondary | ICD-10-CM | POA: Diagnosis not present

## 2021-04-03 DIAGNOSIS — Z96651 Presence of right artificial knee joint: Secondary | ICD-10-CM | POA: Diagnosis not present

## 2021-04-03 DIAGNOSIS — I10 Essential (primary) hypertension: Secondary | ICD-10-CM | POA: Diagnosis not present

## 2021-04-03 DIAGNOSIS — M75101 Unspecified rotator cuff tear or rupture of right shoulder, not specified as traumatic: Secondary | ICD-10-CM | POA: Diagnosis not present

## 2021-04-03 DIAGNOSIS — M549 Dorsalgia, unspecified: Secondary | ICD-10-CM | POA: Diagnosis not present

## 2021-04-03 DIAGNOSIS — Z7984 Long term (current) use of oral hypoglycemic drugs: Secondary | ICD-10-CM | POA: Diagnosis not present

## 2021-04-03 DIAGNOSIS — K219 Gastro-esophageal reflux disease without esophagitis: Secondary | ICD-10-CM | POA: Diagnosis not present

## 2021-04-03 DIAGNOSIS — L309 Dermatitis, unspecified: Secondary | ICD-10-CM | POA: Diagnosis not present

## 2021-04-03 DIAGNOSIS — M1711 Unilateral primary osteoarthritis, right knee: Secondary | ICD-10-CM | POA: Diagnosis not present

## 2021-04-03 DIAGNOSIS — G8929 Other chronic pain: Secondary | ICD-10-CM | POA: Diagnosis not present

## 2021-04-03 DIAGNOSIS — G4733 Obstructive sleep apnea (adult) (pediatric): Secondary | ICD-10-CM | POA: Diagnosis not present

## 2021-04-03 DIAGNOSIS — Z7982 Long term (current) use of aspirin: Secondary | ICD-10-CM | POA: Diagnosis not present

## 2021-04-03 DIAGNOSIS — Z471 Aftercare following joint replacement surgery: Secondary | ICD-10-CM | POA: Diagnosis not present

## 2021-04-03 DIAGNOSIS — E119 Type 2 diabetes mellitus without complications: Secondary | ICD-10-CM | POA: Diagnosis not present

## 2021-04-03 DIAGNOSIS — J449 Chronic obstructive pulmonary disease, unspecified: Secondary | ICD-10-CM | POA: Diagnosis not present

## 2021-04-05 ENCOUNTER — Ambulatory Visit (INDEPENDENT_AMBULATORY_CARE_PROVIDER_SITE_OTHER): Payer: Medicare Other | Admitting: Orthopedic Surgery

## 2021-04-05 ENCOUNTER — Other Ambulatory Visit: Payer: Self-pay

## 2021-04-05 ENCOUNTER — Encounter: Payer: Self-pay | Admitting: Orthopedic Surgery

## 2021-04-05 DIAGNOSIS — Z96651 Presence of right artificial knee joint: Secondary | ICD-10-CM

## 2021-04-05 MED ORDER — OXYCODONE HCL 5 MG PO TABS: 5.0000 mg | ORAL_TABLET | ORAL | 0 refills | Status: AC

## 2021-04-05 NOTE — Patient Instructions (Signed)
Physical therapy has been ordered for you at [facility]. [phone] is the phone number you need to call them today for appointment as soon as possible

## 2021-04-05 NOTE — Progress Notes (Signed)
Chief Complaint  Patient presents with   Post-op Follow-up    Right knee 03/21/21    Encounter Diagnosis  Name Primary?   S/P total knee replacement, right 03/21/21 Yes   Terry Case had an ultrasound to rule out DVT as the therapist was concerned about the swelling he was having in his right calf it was negative  He is postop day 15 from his right total knee with an attune fixed-bearing posterior stabilized knee  He currently has 0 to 90 degrees of flexion he does have some swelling in the leg but it does not seem too bad  We are going to refill his oxycodone for pain he will follow-up in 3 weeks his therapy will start in [place]  He can shower in 7 days  Meds ordered this encounter  Medications   oxyCODONE (OXY IR/ROXICODONE) 5 MG immediate release tablet    Sig: Take 1 tablet (5 mg total) by mouth every 4 (four) hours for 7 days.    Dispense:  42 tablet    Refill:  0

## 2021-04-06 DIAGNOSIS — Z7982 Long term (current) use of aspirin: Secondary | ICD-10-CM | POA: Diagnosis not present

## 2021-04-06 DIAGNOSIS — Z471 Aftercare following joint replacement surgery: Secondary | ICD-10-CM | POA: Diagnosis not present

## 2021-04-06 DIAGNOSIS — Z7984 Long term (current) use of oral hypoglycemic drugs: Secondary | ICD-10-CM | POA: Diagnosis not present

## 2021-04-06 DIAGNOSIS — G8929 Other chronic pain: Secondary | ICD-10-CM | POA: Diagnosis not present

## 2021-04-06 DIAGNOSIS — M1711 Unilateral primary osteoarthritis, right knee: Secondary | ICD-10-CM | POA: Diagnosis not present

## 2021-04-06 DIAGNOSIS — J449 Chronic obstructive pulmonary disease, unspecified: Secondary | ICD-10-CM | POA: Diagnosis not present

## 2021-04-06 DIAGNOSIS — I10 Essential (primary) hypertension: Secondary | ICD-10-CM | POA: Diagnosis not present

## 2021-04-06 DIAGNOSIS — M75101 Unspecified rotator cuff tear or rupture of right shoulder, not specified as traumatic: Secondary | ICD-10-CM | POA: Diagnosis not present

## 2021-04-06 DIAGNOSIS — L309 Dermatitis, unspecified: Secondary | ICD-10-CM | POA: Diagnosis not present

## 2021-04-06 DIAGNOSIS — G4733 Obstructive sleep apnea (adult) (pediatric): Secondary | ICD-10-CM | POA: Diagnosis not present

## 2021-04-06 DIAGNOSIS — Z96651 Presence of right artificial knee joint: Secondary | ICD-10-CM | POA: Diagnosis not present

## 2021-04-06 DIAGNOSIS — Z87891 Personal history of nicotine dependence: Secondary | ICD-10-CM | POA: Diagnosis not present

## 2021-04-06 DIAGNOSIS — M549 Dorsalgia, unspecified: Secondary | ICD-10-CM | POA: Diagnosis not present

## 2021-04-06 DIAGNOSIS — K219 Gastro-esophageal reflux disease without esophagitis: Secondary | ICD-10-CM | POA: Diagnosis not present

## 2021-04-06 DIAGNOSIS — E119 Type 2 diabetes mellitus without complications: Secondary | ICD-10-CM | POA: Diagnosis not present

## 2021-04-11 ENCOUNTER — Other Ambulatory Visit: Payer: Self-pay

## 2021-04-11 ENCOUNTER — Ambulatory Visit (HOSPITAL_COMMUNITY): Payer: Medicare Other | Attending: Orthopedic Surgery

## 2021-04-11 DIAGNOSIS — M25561 Pain in right knee: Secondary | ICD-10-CM | POA: Diagnosis not present

## 2021-04-11 DIAGNOSIS — R2689 Other abnormalities of gait and mobility: Secondary | ICD-10-CM | POA: Diagnosis not present

## 2021-04-11 DIAGNOSIS — R262 Difficulty in walking, not elsewhere classified: Secondary | ICD-10-CM | POA: Diagnosis not present

## 2021-04-11 DIAGNOSIS — M6281 Muscle weakness (generalized): Secondary | ICD-10-CM | POA: Diagnosis not present
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
## 2021-04-11 NOTE — Addendum Note (Signed)
Addended by: Toniann Fail on: 04/11/2021 09:40 AM   Modules accepted: Orders

## 2021-04-11 NOTE — Patient Instructions (Signed)
Access Code: [email or web address] URL: [email or web address] Date: 04/11/2021 Prepared by: Sherlyn Lees  Exercises Supine Heel Slide with Strap - 1 x daily - 7 x weekly - 3 sets - 10 reps Supine Knee Extension Mobilization with Weight - 1 x daily - 7 x weekly Hooklying Hamstring Stretch with Strap - 1 x daily - 7 x weekly - 3 sets - 1-3 reps - 60 sec hold Mini Squat with Counter Support - 1 x daily - 7 x weekly - 3 sets - 10 reps Prone Knee Extension Hang - 1-3 x daily - 7 x weekly Prone Quadriceps Stretch with Strap - 1 x daily - 7 x weekly - 3 sets - 3 reps - 60 sec hold

## 2021-04-12 NOTE — Telephone Encounter (Signed)
Called patient to discuss Colorectal Cancer Screening

## 2021-04-13 ENCOUNTER — Other Ambulatory Visit: Payer: Self-pay

## 2021-04-13 ENCOUNTER — Encounter (HOSPITAL_COMMUNITY): Payer: Self-pay

## 2021-04-13 ENCOUNTER — Ambulatory Visit (HOSPITAL_COMMUNITY): Payer: Medicare Other

## 2021-04-13 DIAGNOSIS — M6281 Muscle weakness (generalized): Secondary | ICD-10-CM

## 2021-04-13 DIAGNOSIS — R262 Difficulty in walking, not elsewhere classified: Secondary | ICD-10-CM

## 2021-04-13 DIAGNOSIS — M25561 Pain in right knee: Secondary | ICD-10-CM | POA: Diagnosis not present

## 2021-04-13 DIAGNOSIS — R2689 Other abnormalities of gait and mobility: Secondary | ICD-10-CM | POA: Diagnosis not present
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
## 2021-04-14 DIAGNOSIS — E1169 Type 2 diabetes mellitus with other specified complication: Secondary | ICD-10-CM | POA: Diagnosis not present

## 2021-04-14 DIAGNOSIS — I10 Essential (primary) hypertension: Secondary | ICD-10-CM | POA: Diagnosis not present

## 2021-04-14 DIAGNOSIS — Z23 Encounter for immunization: Secondary | ICD-10-CM | POA: Diagnosis not present

## 2021-04-17 ENCOUNTER — Encounter (HOSPITAL_COMMUNITY): Payer: Self-pay | Admitting: Physical Therapy

## 2021-04-17 ENCOUNTER — Other Ambulatory Visit: Payer: Self-pay

## 2021-04-17 ENCOUNTER — Ambulatory Visit (HOSPITAL_COMMUNITY): Payer: Medicare Other | Admitting: Physical Therapy

## 2021-04-17 DIAGNOSIS — M25561 Pain in right knee: Secondary | ICD-10-CM

## 2021-04-17 DIAGNOSIS — R2689 Other abnormalities of gait and mobility: Secondary | ICD-10-CM | POA: Diagnosis not present

## 2021-04-17 DIAGNOSIS — R262 Difficulty in walking, not elsewhere classified: Secondary | ICD-10-CM | POA: Diagnosis not present

## 2021-04-17 DIAGNOSIS — M6281 Muscle weakness (generalized): Secondary | ICD-10-CM | POA: Diagnosis not present
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
## 2021-04-17 NOTE — Patient Instructions (Signed)
Access Code: [email or web address] URL: [email or web address] Date: 04/17/2021 Prepared by: [facility] Cheng Dec  Exercises Seated Long Arc Quad - 1 x daily - 7 x weekly - 10 reps - 10 second hold

## 2021-04-18 ENCOUNTER — Other Ambulatory Visit: Payer: Self-pay | Admitting: Orthopedic Surgery

## 2021-04-19 ENCOUNTER — Ambulatory Visit (HOSPITAL_COMMUNITY): Payer: Medicare Other

## 2021-04-20 ENCOUNTER — Other Ambulatory Visit: Payer: Self-pay | Admitting: Orthopedic Surgery

## 2021-04-20 MED ORDER — OXYCODONE HCL 5 MG PO CAPS: 5.0000 mg | ORAL_CAPSULE | ORAL | 0 refills | Status: DC | PRN

## 2021-04-20 NOTE — Telephone Encounter (Signed)
Patient called for refill of medication   - ?not on current medication list?  oxyCODONE (OXY IR/ROXICODONE)  [employer], [place], [place]

## 2021-04-24 ENCOUNTER — Other Ambulatory Visit: Payer: Self-pay

## 2021-04-24 ENCOUNTER — Ambulatory Visit (HOSPITAL_COMMUNITY): Payer: Medicare Other

## 2021-04-24 DIAGNOSIS — R2689 Other abnormalities of gait and mobility: Secondary | ICD-10-CM | POA: Diagnosis not present

## 2021-04-24 DIAGNOSIS — M25561 Pain in right knee: Secondary | ICD-10-CM | POA: Diagnosis not present

## 2021-04-24 DIAGNOSIS — R262 Difficulty in walking, not elsewhere classified: Secondary | ICD-10-CM

## 2021-04-24 DIAGNOSIS — M6281 Muscle weakness (generalized): Secondary | ICD-10-CM
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
## 2021-04-26 ENCOUNTER — Ambulatory Visit (INDEPENDENT_AMBULATORY_CARE_PROVIDER_SITE_OTHER): Payer: Medicare Other | Admitting: Orthopedic Surgery

## 2021-04-26 ENCOUNTER — Other Ambulatory Visit: Payer: Self-pay

## 2021-04-26 ENCOUNTER — Ambulatory Visit (HOSPITAL_COMMUNITY): Payer: Medicare Other

## 2021-04-26 ENCOUNTER — Encounter: Payer: Self-pay | Admitting: Orthopedic Surgery

## 2021-04-26 DIAGNOSIS — Z96651 Presence of right artificial knee joint: Secondary | ICD-10-CM

## 2021-04-26 DIAGNOSIS — M6281 Muscle weakness (generalized): Secondary | ICD-10-CM

## 2021-04-26 DIAGNOSIS — R2689 Other abnormalities of gait and mobility: Secondary | ICD-10-CM

## 2021-04-26 DIAGNOSIS — M25561 Pain in right knee: Secondary | ICD-10-CM

## 2021-04-26 DIAGNOSIS — R262 Difficulty in walking, not elsewhere classified: Secondary | ICD-10-CM | POA: Diagnosis not present

## 2021-04-26 MED ORDER — OXYCODONE HCL 5 MG PO TABS
5.0000 mg | ORAL_TABLET | Freq: Four times a day (QID) | ORAL | 0 refills | Status: AC | PRN
Start: 2021-04-26 — End: 2021-04-29

## 2021-04-26 NOTE — Progress Notes (Signed)
Chief Complaint  Patient presents with   Post-op Follow-up    03/21/21 right total knee replacement improving    Encounter Diagnosis  Name Primary?   S/P total knee replacement, right 03/21/21 Yes    55 year old male status post right total knee on the 20th he is postop day 36  He is using his cane he is doing well his swelling is gone down his range of motion is 0 to 115 degrees  Is taking oxycodone 5 mg 6 times a day  We discussed changing this to every 4 dosing and weaning him off of this over the next month  Continue therapy follow-up in 1 month  Encounter Diagnosis  Name Primary?   S/P total knee replacement, right 03/21/21 Yes   Meds ordered this encounter  Medications   oxyCODONE (OXY IR/ROXICODONE) 5 MG immediate release tablet    Sig: Take 1 tablet (5 mg total) by mouth every 6 (six) hours as needed for up to 3 days for severe pain.    Dispense:  12 tablet    Refill:  0
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
## 2021-05-01 ENCOUNTER — Other Ambulatory Visit: Payer: Self-pay

## 2021-05-01 ENCOUNTER — Encounter (HOSPITAL_COMMUNITY): Payer: Medicare Other

## 2021-05-01 DIAGNOSIS — K219 Gastro-esophageal reflux disease without esophagitis: Secondary | ICD-10-CM | POA: Diagnosis not present

## 2021-05-01 DIAGNOSIS — M1712 Unilateral primary osteoarthritis, left knee: Secondary | ICD-10-CM | POA: Diagnosis not present

## 2021-05-01 DIAGNOSIS — E1165 Type 2 diabetes mellitus with hyperglycemia: Secondary | ICD-10-CM | POA: Diagnosis not present

## 2021-05-01 DIAGNOSIS — I10 Essential (primary) hypertension: Secondary | ICD-10-CM | POA: Diagnosis not present

## 2021-05-01 MED ORDER — HYDROCODONE-ACETAMINOPHEN 10-325 MG PO TABS: 1.0000 | ORAL_TABLET | Freq: Four times a day (QID) | ORAL | 0 refills | Status: AC | PRN

## 2021-05-01 NOTE — Telephone Encounter (Signed)
Oxycodone 5 MG capsule  Take 1 capsule (5mg  total) by mouth every 2(two) hours as needed.   PATIENT USES [facility] ON [address]

## 2021-05-03 ENCOUNTER — Encounter (HOSPITAL_COMMUNITY): Payer: Medicare Other

## 2021-05-04 NOTE — Telephone Encounter (Signed)
ERROR

## 2021-05-08 ENCOUNTER — Encounter (HOSPITAL_COMMUNITY): Payer: Medicare Other

## 2021-05-10 ENCOUNTER — Encounter (HOSPITAL_COMMUNITY): Payer: Medicare Other

## 2021-05-12 ENCOUNTER — Other Ambulatory Visit: Payer: Self-pay | Admitting: Orthopedic Surgery

## 2021-05-15 ENCOUNTER — Encounter (HOSPITAL_COMMUNITY): Payer: Medicare Other

## 2021-05-16 ENCOUNTER — Other Ambulatory Visit: Payer: Self-pay | Admitting: Orthopedic Surgery

## 2021-05-16 NOTE — Telephone Encounter (Signed)
Patient called for refill HYDROcodone-acetaminophen (NORCO) 10-325 MG tablet 28 tablet     [place], [place]

## 2021-05-17 ENCOUNTER — Other Ambulatory Visit: Payer: Self-pay | Admitting: Orthopedic Surgery

## 2021-05-17 ENCOUNTER — Encounter (HOSPITAL_COMMUNITY): Payer: Medicare Other

## 2021-05-17 DIAGNOSIS — Z96651 Presence of right artificial knee joint: Secondary | ICD-10-CM

## 2021-05-17 MED ORDER — HYDROCODONE-ACETAMINOPHEN 7.5-325 MG PO TABS
1.0000 | ORAL_TABLET | ORAL | 0 refills | Status: AC | PRN
Start: 2021-05-17 — End: 2021-05-22

## 2021-05-22 ENCOUNTER — Encounter (HOSPITAL_COMMUNITY): Payer: Medicare Other

## 2021-05-24 ENCOUNTER — Encounter (HOSPITAL_COMMUNITY): Payer: Medicare Other | Admitting: Physical Therapy

## 2021-05-31 ENCOUNTER — Ambulatory Visit (INDEPENDENT_AMBULATORY_CARE_PROVIDER_SITE_OTHER): Payer: Medicare Other | Admitting: Orthopedic Surgery

## 2021-05-31 ENCOUNTER — Other Ambulatory Visit: Payer: Self-pay

## 2021-05-31 ENCOUNTER — Encounter: Payer: Self-pay | Admitting: Orthopedic Surgery

## 2021-05-31 DIAGNOSIS — Z96651 Presence of right artificial knee joint: Secondary | ICD-10-CM

## 2021-05-31 MED ORDER — HYDROCODONE-ACETAMINOPHEN 5-325 MG PO TABS
1.0000 | ORAL_TABLET | Freq: Four times a day (QID) | ORAL | 0 refills | Status: DC | PRN
Start: 2021-05-31 — End: 2021-06-14

## 2021-05-31 NOTE — Progress Notes (Signed)
Chief Complaint  Patient presents with   Post-op Follow-up    Right Total knee replacement states leg swelling /sock leaves a line in his leg    Encounter Diagnosis  Name Primary?   S/P total knee replacement, right 03/21/21 Yes   Terry Case had a right total knee 8 to 9 weeks ago he complains of swelling after walking  He does elevate the leg seems to go down he has no pain in his knee on the surgical side has some pain in the other knee which is also at end-stage osteoarthritis  Exam of his right knee full extension 120 degrees of flexion stable in flexion extension  No edema noted today  Recommend TED hose  Norco 5 mg every 6 as needed which she was on before surgery  Keep previous appointment as scheduled  Meds ordered this encounter  Medications   HYDROcodone-acetaminophen (NORCO/VICODIN) 5-325 MG tablet    Sig: Take 1 tablet by mouth every 6 (six) hours as needed for moderate pain.    Dispense:  30 tablet    Refill:  0

## 2021-06-14 ENCOUNTER — Other Ambulatory Visit: Payer: Self-pay | Admitting: Orthopedic Surgery

## 2021-06-14 MED ORDER — HYDROCODONE-ACETAMINOPHEN 5-325 MG PO TABS: 1.0000 | ORAL_TABLET | Freq: Four times a day (QID) | ORAL | 0 refills | Status: DC | PRN

## 2021-06-14 NOTE — Telephone Encounter (Signed)
Patient called requesting refill for his pain medicine.   HYDROcodone-acetaminophen (NORCO/VICODIN) 5-325 MG tablet  Pharmacy: [facility] on [address]

## 2021-06-19 DIAGNOSIS — M79672 Pain in left foot: Secondary | ICD-10-CM | POA: Diagnosis not present

## 2021-06-19 DIAGNOSIS — M79671 Pain in right foot: Secondary | ICD-10-CM | POA: Diagnosis not present

## 2021-06-19 DIAGNOSIS — L110 Acquired keratosis follicularis: Secondary | ICD-10-CM | POA: Diagnosis not present

## 2021-06-19 DIAGNOSIS — M79675 Pain in left toe(s): Secondary | ICD-10-CM | POA: Diagnosis not present

## 2021-06-19 DIAGNOSIS — E1140 Type 2 diabetes mellitus with diabetic neuropathy, unspecified: Secondary | ICD-10-CM | POA: Diagnosis not present

## 2021-06-19 DIAGNOSIS — M79674 Pain in right toe(s): Secondary | ICD-10-CM | POA: Diagnosis not present

## 2021-06-29 ENCOUNTER — Ambulatory Visit (INDEPENDENT_AMBULATORY_CARE_PROVIDER_SITE_OTHER): Payer: Medicare Other | Admitting: Orthopedic Surgery

## 2021-06-29 ENCOUNTER — Encounter: Payer: Self-pay | Admitting: Orthopedic Surgery

## 2021-06-29 ENCOUNTER — Other Ambulatory Visit: Payer: Self-pay

## 2021-06-29 VITALS — Ht 68.0 in | Wt 277.0 lb

## 2021-06-29 DIAGNOSIS — M25562 Pain in left knee: Secondary | ICD-10-CM

## 2021-06-29 DIAGNOSIS — Z96651 Presence of right artificial knee joint: Secondary | ICD-10-CM | POA: Diagnosis not present

## 2021-06-29 DIAGNOSIS — M1711 Unilateral primary osteoarthritis, right knee: Secondary | ICD-10-CM

## 2021-06-29 DIAGNOSIS — M1712 Unilateral primary osteoarthritis, left knee: Secondary | ICD-10-CM

## 2021-06-29 DIAGNOSIS — G8929 Other chronic pain: Secondary | ICD-10-CM | POA: Diagnosis not present

## 2021-06-29 MED ORDER — HYDROCODONE-ACETAMINOPHEN 5-325 MG PO TABS: 1.0000 | ORAL_TABLET | Freq: Four times a day (QID) | ORAL | 0 refills | Status: DC | PRN

## 2021-06-29 NOTE — Progress Notes (Signed)
Chief Complaint  Patient presents with   Routine Post Op    Rt TKR DOS 03/21/21   Medication Refill    Hydrocodone    Encounter Diagnoses  Name Primary?   S/P total knee replacement, right 03/21/21 Yes   Primary osteoarthritis of right knee    Primary osteoarthritis of left knee    Chronic pain of left knee    Terry Case is doing well with his right total knee.  He is having some pain in his left osteoarthritic knee for which he takes hydrocodone  The right knee the incision is great there is no swelling there is no signs of infection there is no erythema he comes to full extension he flexes 115 degrees  He walks without support  Recommend 52-month follow-up  He can continue the hydrocodone for the chronic pain of his left knee  Meds ordered this encounter  Medications   HYDROcodone-acetaminophen (NORCO/VICODIN) 5-325 MG tablet    Sig: Take 1 tablet by mouth every 6 (six) hours as needed for moderate pain.    Dispense:  30 tablet    Refill:  0

## 2021-06-30 DIAGNOSIS — M1712 Unilateral primary osteoarthritis, left knee: Secondary | ICD-10-CM | POA: Diagnosis not present

## 2021-06-30 DIAGNOSIS — K219 Gastro-esophageal reflux disease without esophagitis: Secondary | ICD-10-CM | POA: Diagnosis not present

## 2021-06-30 DIAGNOSIS — E1165 Type 2 diabetes mellitus with hyperglycemia: Secondary | ICD-10-CM | POA: Diagnosis not present

## 2021-06-30 DIAGNOSIS — I10 Essential (primary) hypertension: Secondary | ICD-10-CM | POA: Diagnosis not present

## 2021-07-12 ENCOUNTER — Other Ambulatory Visit: Payer: Self-pay | Admitting: Orthopedic Surgery

## 2021-07-12 MED ORDER — HYDROCODONE-ACETAMINOPHEN 5-325 MG PO TABS: 1.0000 | ORAL_TABLET | Freq: Four times a day (QID) | ORAL | 0 refills | Status: DC | PRN

## 2021-07-12 NOTE — Telephone Encounter (Signed)
Patient requesting refill for his pain medicine.   HYDROcodone-acetaminophen (NORCO/VICODIN) 5-325 MG tablet  Pharmacy:  [facility] on [address]

## 2021-07-13 ENCOUNTER — Other Ambulatory Visit: Payer: Self-pay | Admitting: Orthopedic Surgery

## 2021-07-18 ENCOUNTER — Ambulatory Visit: Payer: Medicare Other | Admitting: Nutrition

## 2021-07-18 DIAGNOSIS — H40023 Open angle with borderline findings, high risk, bilateral: Secondary | ICD-10-CM | POA: Diagnosis not present

## 2021-07-18 DIAGNOSIS — H25813 Combined forms of age-related cataract, bilateral: Secondary | ICD-10-CM | POA: Diagnosis not present

## 2021-07-18 DIAGNOSIS — H401131 Primary open-angle glaucoma, bilateral, mild stage: Secondary | ICD-10-CM | POA: Diagnosis not present

## 2021-07-18 DIAGNOSIS — E119 Type 2 diabetes mellitus without complications: Secondary | ICD-10-CM | POA: Diagnosis not present

## 2021-07-18 DIAGNOSIS — H179 Unspecified corneal scar and opacity: Secondary | ICD-10-CM | POA: Diagnosis not present

## 2021-07-18 DIAGNOSIS — H35033 Hypertensive retinopathy, bilateral: Secondary | ICD-10-CM | POA: Diagnosis not present

## 2021-07-27 ENCOUNTER — Other Ambulatory Visit: Payer: Self-pay | Admitting: Orthopedic Surgery

## 2021-07-27 NOTE — Telephone Encounter (Signed)
Patient requests refill - reviewed office policy regarding requests to be received by noon on Thursdays - HYDROcodone-acetaminophen (NORCO/VICODIN) 5-325 MG tablet 30 tablet      [facility],  [place], [place], [place]

## 2021-08-01 MED ORDER — HYDROCODONE-ACETAMINOPHEN 5-325 MG PO TABS: 1.0000 | ORAL_TABLET | Freq: Four times a day (QID) | ORAL | 0 refills | Status: DC | PRN

## 2021-08-08 ENCOUNTER — Ambulatory Visit: Payer: Medicare Other | Admitting: Nutrition

## 2021-08-10 ENCOUNTER — Other Ambulatory Visit: Payer: Self-pay | Admitting: Orthopedic Surgery

## 2021-08-15 ENCOUNTER — Telehealth: Payer: Self-pay | Admitting: Orthopedic Surgery

## 2021-08-15 MED ORDER — HYDROCODONE-ACETAMINOPHEN 5-325 MG PO TABS: 1.0000 | ORAL_TABLET | Freq: Four times a day (QID) | ORAL | 0 refills | Status: DC | PRN

## 2021-08-15 NOTE — Telephone Encounter (Signed)
Patient called for refill: HYDROcodone-acetaminophen (NORCO/VICODIN) 5-325 MG tablet 30 tablet      [facility], [place], [place]

## 2021-08-24 ENCOUNTER — Ambulatory Visit: Payer: Medicare Other | Admitting: Nutrition

## 2021-08-28 ENCOUNTER — Other Ambulatory Visit: Payer: Self-pay

## 2021-08-28 MED ORDER — HYDROCODONE-ACETAMINOPHEN 5-325 MG PO TABS: 1.0000 | ORAL_TABLET | Freq: Four times a day (QID) | ORAL | 0 refills | Status: DC | PRN

## 2021-08-28 NOTE — Telephone Encounter (Signed)
Hydrocodone-Acetaminophen 5/325 mg  Qty 30 Tablets  Take 1 tablet by mouth every 6 (six) hours as needed for moderate pain.  PATIENT USES [facility] ON [address]

## 2021-08-29 DIAGNOSIS — E1165 Type 2 diabetes mellitus with hyperglycemia: Secondary | ICD-10-CM | POA: Diagnosis not present

## 2021-08-29 DIAGNOSIS — I10 Essential (primary) hypertension: Secondary | ICD-10-CM | POA: Diagnosis not present

## 2021-08-29 DIAGNOSIS — E1169 Type 2 diabetes mellitus with other specified complication: Secondary | ICD-10-CM | POA: Diagnosis not present

## 2021-08-31 ENCOUNTER — Ambulatory Visit: Payer: Medicare Other | Admitting: Nutrition

## 2021-09-06 ENCOUNTER — Other Ambulatory Visit: Payer: Self-pay

## 2021-09-06 ENCOUNTER — Encounter: Payer: Self-pay | Admitting: Nutrition

## 2021-09-06 ENCOUNTER — Other Ambulatory Visit: Payer: Self-pay | Admitting: Orthopedic Surgery

## 2021-09-06 ENCOUNTER — Encounter: Payer: Medicare Other | Attending: Family Medicine | Admitting: Nutrition

## 2021-09-06 DIAGNOSIS — I10 Essential (primary) hypertension: Secondary | ICD-10-CM | POA: Insufficient documentation

## 2021-09-06 DIAGNOSIS — E782 Mixed hyperlipidemia: Secondary | ICD-10-CM | POA: Insufficient documentation

## 2021-09-06 DIAGNOSIS — E118 Type 2 diabetes mellitus with unspecified complications: Secondary | ICD-10-CM | POA: Insufficient documentation

## 2021-09-06 NOTE — Patient Instructions (Signed)
Goals ? ?Eat three balanced meal per day ?Don't skip meals. ?Don'teat after  7pm. ?Increase more fresh fruits, vegetables and whole grain ?Walk 150 minutes per week. ?Lose 1 lb per week ? ?

## 2021-09-06 NOTE — Progress Notes (Unsigned)
°  Medical Nutrition Therapy:  Appt start time:  1638 and time: 1500  Assessment:  Primary concerns today: Diabetes and Obesity. Dx Dm x 3 years. Follow up     Wt stable. Still working with mowing yards. He has been eating more fruit, vegetables and cut out eating after 7 pm.  Scheduled to have knee surgery in September with right knee and then will do left knee in future. BS are 85-120's. Doing very well. Still taking Janumet Gained 11 lbs Hasn't been walking due to knee issues. Scheduled to have surgery soon. A1C down to 5.8%. Tcho 92 mg/dl, HDL 34 Low mg/dl and TG 45 and LDL 45.  BS doing much better. Feels better.  Preferred Learning Style: Auditory Visual-limited reading ability but likes to see things to learn Hands on  Learning Readiness:  Ready Change in progress   MEDICATIONS:    DIETARY INTAKE:  24-hr recall:  B (   8 am AM):  Honey cherrios, or 2 eggs and orange, toast Lunch  CHicken salad  on bread with tomatoes, with grapes, water Dinner: CHicken salad sandwich,water Usual physical activity: walks some.  Estimated energy needs: 1800 calories 200g carbohydrates 135g protein 50 g fat  Progress Towards Goal(s):  In progress.   Nutritional Diagnosis:  NB-1.1 Food and nutrition-related knowledge deficit As related to DIabetes Type 2.  As evidenced by A1C > 6.5%.    Intervention:  Nutrition and Diabetes education provided on My Plate, CHO counting, meal planning, portion sizes, timing of meals, avoiding snacks between meals unless having a low blood sugar, target ranges for A1C and blood sugars, signs/symptoms and treatment of hyper/hypoglycemia, monitoring blood sugars, taking medications as prescribed, benefits of exercising 30 minutes per day and prevention of complications of DM.  Goals  Focus on more fresh fruits and vegetables Keep drinking gallon of water per day Test BS a few times a week instead of daily. Cut out snacks and processed foods. 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 Eat meals on time. Teaching Method Utilized:  Visual Auditory Hands on  Handouts given during visit include: The Plate Method  Visual plate ideas   Barriers to learning/adherence to lifestyle change: low reading and writing ability  Demonstrated degree of understanding via:  Teach Back   Monitoring/Evaluation:  Dietary intake, exercise, , and body weight in 6 month(s).

## 2021-09-12 ENCOUNTER — Other Ambulatory Visit: Payer: Self-pay | Admitting: Orthopedic Surgery

## 2021-09-12 MED ORDER — HYDROCODONE-ACETAMINOPHEN 5-325 MG PO TABS: 1.0000 | ORAL_TABLET | Freq: Four times a day (QID) | ORAL | 0 refills | Status: DC | PRN

## 2021-09-12 NOTE — Telephone Encounter (Signed)
Refill request sent to provider.

## 2021-09-12 NOTE — Telephone Encounter (Signed)
Patient requests refill: ?HYDROcodone-acetaminophen (NORCO/VICODIN) 5-325 MG tablet 30 tablet  ?    [facility], [employer], [place] ?

## 2021-09-18 DIAGNOSIS — M79674 Pain in right toe(s): Secondary | ICD-10-CM | POA: Diagnosis not present

## 2021-09-18 DIAGNOSIS — E1140 Type 2 diabetes mellitus with diabetic neuropathy, unspecified: Secondary | ICD-10-CM | POA: Diagnosis not present

## 2021-09-18 DIAGNOSIS — M79675 Pain in left toe(s): Secondary | ICD-10-CM | POA: Diagnosis not present

## 2021-09-18 DIAGNOSIS — L110 Acquired keratosis follicularis: Secondary | ICD-10-CM | POA: Diagnosis not present

## 2021-09-18 DIAGNOSIS — M79671 Pain in right foot: Secondary | ICD-10-CM | POA: Diagnosis not present

## 2021-09-18 DIAGNOSIS — M79672 Pain in left foot: Secondary | ICD-10-CM | POA: Diagnosis not present

## 2021-09-20 ENCOUNTER — Encounter: Payer: Self-pay | Admitting: Nutrition

## 2021-09-27 ENCOUNTER — Other Ambulatory Visit: Payer: Self-pay | Admitting: Orthopedic Surgery

## 2021-09-27 NOTE — Telephone Encounter (Signed)
Patient called for refill of the following 2 medications ? Terry Case ? ?HYDROcodone-acetaminophen (NORCO/VICODIN) 5-325 MG tablet 30 tablet  ?And also  ?  Tylenol 800 mg  (not on current list?) ?

## 2021-09-28 ENCOUNTER — Ambulatory Visit (INDEPENDENT_AMBULATORY_CARE_PROVIDER_SITE_OTHER): Payer: Medicare Other | Admitting: Orthopedic Surgery

## 2021-09-28 DIAGNOSIS — M1711 Unilateral primary osteoarthritis, right knee: Secondary | ICD-10-CM

## 2021-09-28 DIAGNOSIS — Z96651 Presence of right artificial knee joint: Secondary | ICD-10-CM

## 2021-09-28 DIAGNOSIS — M1712 Unilateral primary osteoarthritis, left knee: Secondary | ICD-10-CM

## 2021-09-28 MED ORDER — HYDROCODONE-ACETAMINOPHEN 5-325 MG PO TABS: 1.0000 | ORAL_TABLET | Freq: Four times a day (QID) | ORAL | 0 refills | Status: DC | PRN

## 2021-09-28 MED ORDER — IBUPROFEN 800 MG PO TABS: 800.0000 mg | ORAL_TABLET | Freq: Three times a day (TID) | ORAL | 1 refills | Status: DC | PRN

## 2021-09-28 NOTE — Progress Notes (Signed)
FOLLOW UP  ? ?Encounter Diagnoses  ?Name Primary?  ? S/P total knee replacement, right 03/21/21 Yes  ? Primary osteoarthritis of right knee   ? Primary osteoarthritis of left knee   ? ? ? ?Chief Complaint  ?Patient presents with  ? Knee Pain  ?  S/p TKA RIGHT ?DOS 03/21/21  ? ? ? ?Terry Case 56 years old 6 months postop right total knee arthritis both knees currently on hydrocodone for chronic pain here for 56-monthfollow-up right knee ? ?He s doing well  ? ?His left knee still bothers him  ? ?RIGHT KNEE: 0-110, STABLE AND NO SIGNS OF INFECTION  ? ?Encounter Diagnoses  ?Name Primary?  ? S/P total knee replacement, right 03/21/21 Yes  ? Primary osteoarthritis of right knee   ? Primary osteoarthritis of left knee   ? ? ? ?I D LIKE TO SEE HIM IN 6 MOS. AND XRAYS ON BOTH KNEES  ? ?Meds ordered this encounter  ?Medications  ? ibuprofen (ADVIL) 800 MG tablet  ?  Sig: Take 1 tablet (800 mg total) by mouth every 8 (eight) hours as needed.  ?  Dispense:  90 tablet  ?  Refill:  1  ? HYDROcodone-acetaminophen (NORCO/VICODIN) 5-325 MG tablet  ?  Sig: Take 1 tablet by mouth every 6 (six) hours as needed for moderate pain.  ?  Dispense:  56 tablet  ?  Refill:  0  ? ? ?

## 2021-09-28 NOTE — Telephone Encounter (Signed)
Spoke to patient. He needs the Ibuprofen '800mg'$  refilled and Norco. Request sent to provider.  ?

## 2021-10-02 DIAGNOSIS — I10 Essential (primary) hypertension: Secondary | ICD-10-CM | POA: Diagnosis not present

## 2021-10-02 DIAGNOSIS — E1169 Type 2 diabetes mellitus with other specified complication: Secondary | ICD-10-CM | POA: Diagnosis not present

## 2021-10-02 DIAGNOSIS — J301 Allergic rhinitis due to pollen: Secondary | ICD-10-CM | POA: Diagnosis not present

## 2021-10-03 ENCOUNTER — Other Ambulatory Visit: Payer: Self-pay | Admitting: Orthopedic Surgery

## 2021-10-07 ENCOUNTER — Other Ambulatory Visit: Payer: Self-pay | Admitting: Orthopedic Surgery

## 2021-10-16 ENCOUNTER — Telehealth: Payer: Self-pay

## 2021-10-16 ENCOUNTER — Other Ambulatory Visit: Payer: Self-pay

## 2021-10-16 DIAGNOSIS — M1711 Unilateral primary osteoarthritis, right knee: Secondary | ICD-10-CM

## 2021-10-16 DIAGNOSIS — M1712 Unilateral primary osteoarthritis, left knee: Secondary | ICD-10-CM

## 2021-10-16 DIAGNOSIS — Z96651 Presence of right artificial knee joint: Secondary | ICD-10-CM

## 2021-10-16 MED ORDER — HYDROCODONE-ACETAMINOPHEN 5-325 MG PO TABS: 1.0000 | ORAL_TABLET | Freq: Four times a day (QID) | ORAL | 0 refills | Status: DC | PRN

## 2021-10-16 NOTE — Telephone Encounter (Signed)
Hydrocodone-Acetaminophen 5/325 MG  Qty 56 Tablets   Take 1 tablet by mouth every 6 (six) hours as needed for moderate pain.   PATIENT USES [facility] ON [address]   

## 2021-10-16 NOTE — Telephone Encounter (Signed)
Request sent to provider.

## 2021-10-26 ENCOUNTER — Other Ambulatory Visit: Payer: Self-pay

## 2021-10-26 ENCOUNTER — Telehealth: Payer: Self-pay | Admitting: Orthopedic Surgery

## 2021-10-26 DIAGNOSIS — M1711 Unilateral primary osteoarthritis, right knee: Secondary | ICD-10-CM

## 2021-10-26 DIAGNOSIS — Z96651 Presence of right artificial knee joint: Secondary | ICD-10-CM

## 2021-10-26 DIAGNOSIS — M1712 Unilateral primary osteoarthritis, left knee: Secondary | ICD-10-CM

## 2021-10-26 MED ORDER — HYDROCODONE-ACETAMINOPHEN 5-325 MG PO TABS: 1.0000 | ORAL_TABLET | Freq: Four times a day (QID) | ORAL | 0 refills | Status: DC | PRN

## 2021-10-26 NOTE — Telephone Encounter (Signed)
Patient wants refill on his pain medicine  ? ?HYDROcodone-acetaminophen (NORCO/VICODIN) 5-325 MG tablet ? ? ? ?ibuprofen (ADVIL) 800 MG tablet ? ? ?Pharmacy:  [facility] on [address]  ?

## 2021-10-26 NOTE — Telephone Encounter (Signed)
Request sent to provider.

## 2021-10-31 DIAGNOSIS — R42 Dizziness and giddiness: Secondary | ICD-10-CM | POA: Diagnosis not present

## 2021-11-06 ENCOUNTER — Encounter: Payer: Medicare Other | Attending: Family Medicine | Admitting: Nutrition

## 2021-11-06 VITALS — Ht 68.0 in | Wt 285.6 lb

## 2021-11-06 DIAGNOSIS — E118 Type 2 diabetes mellitus with unspecified complications: Secondary | ICD-10-CM | POA: Diagnosis not present

## 2021-11-06 DIAGNOSIS — I10 Essential (primary) hypertension: Secondary | ICD-10-CM | POA: Insufficient documentation

## 2021-11-06 DIAGNOSIS — E669 Obesity, unspecified: Secondary | ICD-10-CM | POA: Insufficient documentation

## 2021-11-06 DIAGNOSIS — E782 Mixed hyperlipidemia: Secondary | ICD-10-CM | POA: Insufficient documentation

## 2021-11-06 NOTE — Patient Instructions (Addendum)
Goals  Increase low carb vegetables. Avoid salty foods and processed foods-low sodium  Walk 30 minute 3-4 times per week See about  getting a referral from your MD  to see a counselor

## 2021-11-06 NOTE — Progress Notes (Signed)
  Medical Nutrition Therapy:  Appt start time:  0800 and time: 0830  Assessment:  Primary concerns today: Diabetes and Obesity. Dx Dm x 3 years. Follow up   Gained 5 lbs. Stressed with family issues. Admits he needs to start preparing his own foods, so he has what he needs to eat. Has been eating more processed foods than he should. Still mowing yards for activity. Still taking Janumet.  Diet is low in low carb vegetables, whole fruits and plant based meals. Willing to work on preparing better balanced meals.   Preferred Learning Style: Auditory Visual-limited reading ability but likes to see things to learn Hands on  Learning Readiness:  Ready Change in progress   MEDICATIONS:    DIETARY INTAKE:  24-hr recall:  B (   8 am AM): Eggs , toast and fruit, water Lunch  Pintos 1 cup , water Dinner: 1 c pintos,   Usual physical activity: walks some.  Estimated energy needs: 1800 calories 200g carbohydrates 135g protein 50 g fat  Progress Towards Goal(s):  In progress.   Nutritional Diagnosis:  NB-1.1 Food and nutrition-related knowledge deficit As related to DIabetes Type 2.  As evidenced by A1C > 6.5%.    Intervention:  Nutrition and Diabetes education provided on My Plate, CHO counting, meal planning, portion sizes, timing of meals, avoiding snacks between meals unless having a low blood sugar, target ranges for A1C and blood sugars, signs/symptoms and treatment of hyper/hypoglycemia, monitoring blood sugars, taking medications as prescribed, benefits of exercising 30 minutes per day and prevention of complications of DM.  Goals   Increase low carb vegetables. Avoid salty foods and processed foods-low sodium  Walk 30 minute 3-4 times per week See about  getting a referral from your MD  to see a counselor   Teaching Method Utilized:  Visual Auditory Hands on  Handouts given during visit include: The Plate Method  Visual plate ideas   Barriers to  learning/adherence to lifestyle change: low reading and writing ability  Demonstrated degree of understanding via:  Teach Back   Monitoring/Evaluation:  Dietary intake, exercise, , and body weight in 6 month(s).

## 2021-11-14 ENCOUNTER — Other Ambulatory Visit: Payer: Self-pay

## 2021-11-14 ENCOUNTER — Telehealth: Payer: Self-pay

## 2021-11-14 DIAGNOSIS — M1711 Unilateral primary osteoarthritis, right knee: Secondary | ICD-10-CM

## 2021-11-14 DIAGNOSIS — M1712 Unilateral primary osteoarthritis, left knee: Secondary | ICD-10-CM

## 2021-11-14 DIAGNOSIS — Z96651 Presence of right artificial knee joint: Secondary | ICD-10-CM

## 2021-11-14 MED ORDER — HYDROCODONE-ACETAMINOPHEN 5-325 MG PO TABS: 1.0000 | ORAL_TABLET | Freq: Four times a day (QID) | ORAL | 0 refills | Status: DC | PRN

## 2021-11-14 NOTE — Telephone Encounter (Signed)
Request sent to provider.

## 2021-11-14 NOTE — Telephone Encounter (Signed)
Hydrocodone-Acetaminophen 5/325 MG  Qty 56 Tablets   Take 1 tablet by mouth every 6 (six) hours as needed for moderate pain.   PATIENT USES [facility] ON [address]   

## 2021-11-17 ENCOUNTER — Inpatient Hospital Stay
Admit: 2021-11-17 | Discharge: 2021-11-17 | Disposition: A | Discharge status: Home / Self Care | Payer: BLUE CROSS/BLUE SHIELD | Attending: Emergency Medicine

## 2021-11-17 ENCOUNTER — Emergency Department: Admit: 2021-11-17 | Payer: BLUE CROSS/BLUE SHIELD | Primary: Internal Medicine

## 2021-11-17 DIAGNOSIS — M542 Cervicalgia: Secondary | ICD-10-CM

## 2021-11-17 DIAGNOSIS — R519 Headache, unspecified: Secondary | ICD-10-CM

## 2021-11-17 LAB — BASIC METABOLIC PANEL
Anion Gap: 10 mmol/L (ref 9–17)
BUN: 10 mg/dL (ref 6–20)
Bun/Cre Ratio: 9 (ref 9–20)
CO2: 24 mmol/L (ref 20–31)
Calcium: 9.7 mg/dL (ref 8.6–10.4)
Chloride: 100 mmol/L (ref 98–107)
Creatinine: 1.09 mg/dL (ref 0.70–1.20)
Est, Glom Filt Rate: >60 mL/min/{1.73_m2} (ref >60)
Glucose: 99 mg/dL (ref 70–99)
Potassium: 3.8 mmol/L (ref 3.7–5.3)
Sodium: 134 mmol/L — ABNORMAL LOW (ref 135–144)

## 2021-11-17 LAB — CBC WITH AUTO DIFFERENTIAL
Absolute Eos #: 0.3 10*3/uL (ref 0.0–0.4)
Absolute Lymph #: 2.1 10*3/uL (ref 1.0–4.8)
Absolute Mono #: 0.6 10*3/uL (ref 0.0–1.0)
Basophils Absolute: 0.1 10*3/uL (ref 0.0–0.2)
Basophils: 1 % (ref 0–2)
Eosinophils %: 5 % (ref 0–5)
Hematocrit: 45.8 % (ref 41–53)
Hemoglobin: 15.4 g/dL (ref 13.5–17.5)
Lymphocytes: 30 % (ref 13–44)
MCH: 30.1 pg (ref 26–34)
MCHC: 33.5 g/dL (ref 31–37)
MCV: 89.8 fL (ref 80–100)
Monocytes: 8 % (ref 5–9)
Platelets: 267 10*3/uL (ref 140–450)
RBC: 5.1 m/uL (ref 4.5–5.9)
RDW: 13.6 % (ref 12.1–15.2)
Seg Neutrophils: 56 % (ref 39–75)
Segs Absolute: 4.1 10*3/uL (ref 2.1–6.5)
WBC: 7.2 10*3/uL (ref 3.5–11.0)

## 2021-11-17 LAB — D-DIMER, QUANTITATIVE: D-Dimer, Quant: 0.61 ug/mL FEU — ABNORMAL HIGH (ref 0.00–0.59)

## 2021-11-17 MED ORDER — TIZANIDINE HCL 4 MG PO TABS
4 MG | ORAL_TABLET | Freq: Three times a day (TID) | ORAL | 0 refills | Status: AC | PRN
Start: 2021-11-17 — End: ?

## 2021-11-17 MED ORDER — IOPAMIDOL 76 % IV SOLN
76 % | Freq: Once | INTRAVENOUS | Status: AC | PRN
Start: 2021-11-17 — End: 2021-11-17
  Administered 2021-11-17: 22:00:00 75 mL via INTRAVENOUS

## 2021-11-17 NOTE — ED Provider Notes (Signed)
EMERGENCY DEPARTMENT ENCOUNTER      CHIEF COMPLAINT    Chief Complaint   Patient presents with    Gait Problem     Unsteady few weeks both legs, more-so right    Dizziness     x3days    Neck Pain     Swelling right side of neck    Headache       HPI    Mark Buchanan is a 56 y.o. male who presentsto ED with right-sided neck pain for the past 5 days.  The patient denies injury.  Patient also has been having headaches and dizziness.  Denies trauma to the neck.  Patient is a smoker patient has history of COPD and hyperlipidemia.    PAST MEDICAL HISTORY    Past Medical History:   Diagnosis Date    COPD (chronic obstructive pulmonary disease) (HCC)     Hyperlipidemia        SURGICAL HISTORY    Past Surgical History:   Procedure Laterality Date    ARM SURGERY Right 11/30/2020    IRRIGATION AND DRAINAGE OF RIGHT FOREARM performed by Denny Peon, MD at [facility] OR    BONE INCISION AND DRAINAGE Right 11/30/2020    Right Forearm Incision and Drainage, Dr. Dallas Schimke    CORONARY ANGIOPLASTY WITH STENT PLACEMENT      EXPLORATION OF WOUND OF EXTREMITY Left 02/14/2016    WOUND EXPLORATION AND REVISION performed by Romie Levee, MD at [facility] OR       CURRENT MEDICATIONS    Current Outpatient Rx   Medication Sig Dispense Refill    tiZANidine (ZANAFLEX) 4 MG tablet Take 1 tablet by mouth 3 times daily as needed (for muscle spasm) 20 tablet 0    ASPIRIN LOW DOSE 81 MG EC tablet TAKE 1 TABLET BY MOUTH DAILY (Patient not taking: Reported on 12/09/2020)      doxycycline hyclate (VIBRA-TABS) 100 MG tablet TAKE 1 TABLET BY MOUTH TWICE DAILY FOR 14 DAYS (Patient not taking: Reported on 11/17/2021)      losartan (COZAAR) 25 MG tablet Take 1 tablet by mouth daily (Patient not taking: Reported on 11/17/2021) 90 tablet 1    lactobacillus (CULTURELLE) capsule Take 1 capsule by mouth 2 times daily (with meals) (Patient not taking: Reported on 11/17/2021) 60 capsule 0    clopidogrel (PLAVIX) 75 MG tablet Take 1 tablet by mouth daily      aspirin 81  MG tablet Take 1 tablet by mouth daily      atorvastatin (LIPITOR) 20 MG tablet Take 1 tablet by mouth daily      albuterol (PROVENTIL HFA) 108 (90 BASE) MCG/ACT inhaler Inhale 2 puffs into the lungs every 6 hours as needed for Wheezing 1 Inhaler 3       ALLERGIES    No Known Allergies    FAMILY HISTORY    History reviewed. No pertinent family history.    SOCIAL HISTORY    Social History     Socioeconomic History    Marital status: Married     Spouse name: None    Number of children: None    Years of education: None    Highest education level: None   Tobacco Use    Smoking status: Every Day     Packs/day: 1.00     Types: Cigarettes    Smokeless tobacco: Never   Substance and Sexual Activity    Alcohol use: No    Drug use: No  Social Determinants of Health     Financial Resource Strain: Low Risk     Difficulty of Paying Living Expenses: Not hard at all   Food Insecurity: No Food Insecurity    Worried About Programme researcher, broadcasting/film/videounning Out of Food in the Last Year: Never true    Ran Out of Food in the Last Year: Never true           Review of Systems:  Constitutional:  Denies fever, chills, weight loss or weakness   Eyes:  Denies photophobia or discharge   HENT: Positive for right sided neck pain.  Respiratory:  Denies cough or shortness of breath   Cardiovascular:  Denies chest pain, palpitations or swelling   GI:  Denies abdominal pain, nausea, vomiting, or diarrhea   Musculoskeletal:  Denies back pain   Skin:  Denies rash   Neurologic:  Denies headache, focal weakness or sensory changes   Endocrine:  Denies polyuria or polydypsia   Lymphatic:  Denies swollen glands   Psychiatric:  Denies depression, suicidal ideation or homicidal ideation   All systems negative except as marked.     PHYSICAL EXAM    VITAL SIGNS: BP (!) 181/92   Pulse 81   Temp 97.7 F (36.5 C)   Resp 18   Ht 5\' 5"  (1.651 m)   Wt 139 lb (63 kg)   SpO2 (!) 84%   BMI 23.13 kg/m    Constitutional:  Well developed, Well nourished, No acute distress, Non-toxic  appearance.   HENT:  Normocephalic, Atraumatic, Bilateral external ears normal, Oropharynx moist, No oral exudates, Nose normal. Neck-right lateral neck tenderness no swelling no deformity eyes:  PERRL, EOMI, Conjunctiva normal, No discharge.   Respiratory:  Normal breath sounds, No respiratory distress, No wheezing, No chest tenderness.   Cardiovascular:  Normal heart rate, Normal rhythm, No murmurs, No rubs, No gallops.   GI:  Bowel sounds normal, Soft, No tenderness, No masses, No pulsatile masses.   GU: External genitalia appear normal, No masses or lesions. No discharge. No CVA tenderness.   Musculoskeletal:  Intact distal pulses, No edema, No tenderness, No cyanosis, No clubbing. Good range of motion in all major joints. No tenderness to palpation or major deformities noted. Back- No tenderness.   Integument:  Warm, Dry, No erythema, No rash.   Lymphatic:  No lymphadenopathy noted.   Neurologic:  Alert & oriented x 3, Normal motor function, Normal sensory function, No focal deficits noted.   Psychiatric:  Affect normal, Judgment normal, Mood normal.     EKG        RADIOLOGY    CTA HEAD NECK W CONTRAST   Final Result      1. Intracranial and extracranial vessels demonstrate no stenosis, thrombus,    dissection, aneurysm, or large vessel occlusion.      2. No acute intracranial abnormality. No hemorrhage or mass effect. No    pathologic enhancement.   3. No focal lesions are seen in the region of the marker within the soft    tissues of the neck on the right. No drainable fluid collection is appreciated.             PROCEDURES    Procedures    Labs  Labs Reviewed   D-DIMER, QUANTITATIVE - Abnormal; Notable for the following components:       Result Value    D-Dimer, Quant 0.61 (*)     All other components within normal limits   BASIC METABOLIC PANEL - Abnormal;  Notable for the following components:    Sodium 134 (*)     All other components within normal limits   CBC WITH AUTO DIFFERENTIAL       MIPS  Not  applicable      Total Critical Care time was:    EMERGENCY DEPARTMENT COURSE and DIFFERENTIAL DIAGNOSIS/MDM:    Patient Course: Patient presents to ED with right-sided neck pain for the past 5 days the pain is progressively getting worse.  Patient has history of COPD and hyperlipidemia.  Patient is a smoker.  CTA is negative for any acute findings.    ED Medications administered this visit:    Medications   iopamidol (ISOVUE-370) 76 % injection 75 mL (75 mLs IntraVENous Given 11/17/21 1759)       New Prescriptions from this visit:    New Prescriptions    TIZANIDINE (ZANAFLEX) 4 MG TABLET    Take 1 tablet by mouth 3 times daily as needed (for muscle spasm)       Follow-up:  [facility] ED  [address] [place] [address]  [phone]    As needed, If symptoms worsen        Final Impression:   1. Neck pain    2. Intractable headache, unspecified chronicity pattern, unspecified headache type    3. Dizziness               (Please note that portions of this note were completed with a voice recognition program.  Efforts were made to edit the dictations but occasionally words are mis-transcribed.)          Deanna Artis, MD  11/17/21 5643

## 2021-11-17 NOTE — ED Triage Notes (Signed)
Medication list verified with verbal recall from patient.

## 2021-11-17 NOTE — ED Notes (Signed)
Ticket to ride completed. The following information was reported off:  Name  Allergies  Orientation Level  Destination  Safety Issues  Code Status  Oxygen Requirements  Special needs including mobility, language, communication      Erma Heritage, RN  11/17/21 1743

## 2021-11-28 ENCOUNTER — Other Ambulatory Visit: Payer: Self-pay

## 2021-11-28 DIAGNOSIS — M1712 Unilateral primary osteoarthritis, left knee: Secondary | ICD-10-CM

## 2021-11-28 DIAGNOSIS — Z96651 Presence of right artificial knee joint: Secondary | ICD-10-CM

## 2021-11-28 DIAGNOSIS — M1711 Unilateral primary osteoarthritis, right knee: Secondary | ICD-10-CM

## 2021-11-28 MED ORDER — HYDROCODONE-ACETAMINOPHEN 5-325 MG PO TABS: 1.0000 | ORAL_TABLET | Freq: Four times a day (QID) | ORAL | 0 refills | Status: DC | PRN

## 2021-11-28 NOTE — Telephone Encounter (Signed)
Hydrocodone-Acetaminophen 5/325 MG  Qty 56 Tablets   Take 1 tablet by mouth every 6 (six) hours as needed for moderate pain.   PATIENT USES [facility] ON [address]   

## 2021-11-29 ENCOUNTER — Encounter: Payer: Self-pay | Admitting: Nutrition

## 2021-12-12 ENCOUNTER — Other Ambulatory Visit: Payer: Self-pay | Admitting: Orthopedic Surgery

## 2021-12-12 DIAGNOSIS — M1712 Unilateral primary osteoarthritis, left knee: Secondary | ICD-10-CM

## 2021-12-12 DIAGNOSIS — M1711 Unilateral primary osteoarthritis, right knee: Secondary | ICD-10-CM

## 2021-12-12 DIAGNOSIS — Z96651 Presence of right artificial knee joint: Secondary | ICD-10-CM

## 2021-12-12 NOTE — Telephone Encounter (Signed)
Patient called for refill of the following: HYDROcodone-acetaminophen (NORCO/VICODIN) 5-325 MG tablet 56 tablet   and  ibuprofen (ADVIL) 800 MG tablet 90 tablet  [employer], [place], [place]

## 2021-12-13 MED ORDER — HYDROCODONE-ACETAMINOPHEN 5-325 MG PO TABS: 1.0000 | ORAL_TABLET | Freq: Four times a day (QID) | ORAL | 0 refills | Status: DC | PRN

## 2021-12-18 DIAGNOSIS — L110 Acquired keratosis follicularis: Secondary | ICD-10-CM | POA: Diagnosis not present

## 2021-12-18 DIAGNOSIS — M79674 Pain in right toe(s): Secondary | ICD-10-CM | POA: Diagnosis not present

## 2021-12-18 DIAGNOSIS — M79675 Pain in left toe(s): Secondary | ICD-10-CM | POA: Diagnosis not present

## 2021-12-18 DIAGNOSIS — M79672 Pain in left foot: Secondary | ICD-10-CM | POA: Diagnosis not present

## 2021-12-18 DIAGNOSIS — M79671 Pain in right foot: Secondary | ICD-10-CM | POA: Diagnosis not present

## 2021-12-18 DIAGNOSIS — E1140 Type 2 diabetes mellitus with diabetic neuropathy, unspecified: Secondary | ICD-10-CM | POA: Diagnosis not present

## 2022-01-05 ENCOUNTER — Other Ambulatory Visit: Payer: Self-pay | Admitting: Radiology

## 2022-01-05 DIAGNOSIS — Z96651 Presence of right artificial knee joint: Secondary | ICD-10-CM

## 2022-01-05 DIAGNOSIS — M1711 Unilateral primary osteoarthritis, right knee: Secondary | ICD-10-CM

## 2022-01-05 DIAGNOSIS — M1712 Unilateral primary osteoarthritis, left knee: Secondary | ICD-10-CM

## 2022-01-05 NOTE — Telephone Encounter (Signed)
Patient called said he came in last week and requested Hydrocodone refill I told him I will send message but will be Monday before he gets a reply. He said he walked in and spoke to someone, but does not know who he spoke to.

## 2022-01-08 MED ORDER — HYDROCODONE-ACETAMINOPHEN 5-325 MG PO TABS: 1.0000 | ORAL_TABLET | Freq: Four times a day (QID) | ORAL | 0 refills | Status: DC | PRN

## 2022-01-09 DIAGNOSIS — R42 Dizziness and giddiness: Secondary | ICD-10-CM | POA: Diagnosis not present

## 2022-01-09 DIAGNOSIS — E1169 Type 2 diabetes mellitus with other specified complication: Secondary | ICD-10-CM | POA: Diagnosis not present

## 2022-01-09 DIAGNOSIS — I10 Essential (primary) hypertension: Secondary | ICD-10-CM | POA: Diagnosis not present

## 2022-01-19 DIAGNOSIS — H179 Unspecified corneal scar and opacity: Secondary | ICD-10-CM | POA: Diagnosis not present

## 2022-01-19 DIAGNOSIS — H401131 Primary open-angle glaucoma, bilateral, mild stage: Secondary | ICD-10-CM | POA: Diagnosis not present

## 2022-01-19 DIAGNOSIS — E119 Type 2 diabetes mellitus without complications: Secondary | ICD-10-CM | POA: Diagnosis not present

## 2022-01-19 DIAGNOSIS — H25813 Combined forms of age-related cataract, bilateral: Secondary | ICD-10-CM | POA: Diagnosis not present

## 2022-01-22 ENCOUNTER — Other Ambulatory Visit: Payer: Self-pay

## 2022-01-22 ENCOUNTER — Telehealth: Payer: Self-pay

## 2022-01-22 DIAGNOSIS — M1711 Unilateral primary osteoarthritis, right knee: Secondary | ICD-10-CM

## 2022-01-22 DIAGNOSIS — Z96651 Presence of right artificial knee joint: Secondary | ICD-10-CM

## 2022-01-22 DIAGNOSIS — M1712 Unilateral primary osteoarthritis, left knee: Secondary | ICD-10-CM

## 2022-01-22 MED ORDER — HYDROCODONE-ACETAMINOPHEN 5-325 MG PO TABS: 1.0000 | ORAL_TABLET | Freq: Four times a day (QID) | ORAL | 0 refills | Status: DC | PRN

## 2022-01-22 NOTE — Telephone Encounter (Signed)
Hydrocodone-Acetaminophen 5/325 MG  Qty 56 Tablets   Take 1 tablet by mouth every 6 (six) hours as needed for moderate pain.   PATIENT USES [facility] ON [address]   

## 2022-01-22 NOTE — Telephone Encounter (Signed)
Sent to provider 

## 2022-01-25 ENCOUNTER — Emergency Department (HOSPITAL_COMMUNITY): Payer: Medicare Other

## 2022-01-25 ENCOUNTER — Emergency Department (HOSPITAL_COMMUNITY)
Admission: EM | Admit: 2022-01-25 | Discharge: 2022-01-25 | Disposition: A | Discharge status: Home / Self Care | Payer: Medicare Other | Attending: Emergency Medicine | Admitting: Emergency Medicine

## 2022-01-25 ENCOUNTER — Encounter (HOSPITAL_COMMUNITY): Payer: Self-pay | Admitting: Emergency Medicine

## 2022-01-25 ENCOUNTER — Other Ambulatory Visit: Payer: Self-pay

## 2022-01-25 DIAGNOSIS — Z7951 Long term (current) use of inhaled steroids: Secondary | ICD-10-CM | POA: Insufficient documentation

## 2022-01-25 DIAGNOSIS — Z79899 Other long term (current) drug therapy: Secondary | ICD-10-CM | POA: Diagnosis not present

## 2022-01-25 DIAGNOSIS — R079 Chest pain, unspecified: Secondary | ICD-10-CM | POA: Diagnosis not present

## 2022-01-25 DIAGNOSIS — J45909 Unspecified asthma, uncomplicated: Secondary | ICD-10-CM | POA: Insufficient documentation

## 2022-01-25 DIAGNOSIS — Z7982 Long term (current) use of aspirin: Secondary | ICD-10-CM | POA: Diagnosis not present

## 2022-01-25 DIAGNOSIS — E119 Type 2 diabetes mellitus without complications: Secondary | ICD-10-CM | POA: Insufficient documentation

## 2022-01-25 DIAGNOSIS — M79602 Pain in left arm: Secondary | ICD-10-CM | POA: Insufficient documentation

## 2022-01-25 DIAGNOSIS — R519 Headache, unspecified: Secondary | ICD-10-CM | POA: Diagnosis not present

## 2022-01-25 DIAGNOSIS — M25512 Pain in left shoulder: Secondary | ICD-10-CM | POA: Insufficient documentation

## 2022-01-25 DIAGNOSIS — M1712 Unilateral primary osteoarthritis, left knee: Secondary | ICD-10-CM

## 2022-01-25 DIAGNOSIS — I10 Essential (primary) hypertension: Secondary | ICD-10-CM | POA: Insufficient documentation

## 2022-01-25 DIAGNOSIS — M1711 Unilateral primary osteoarthritis, right knee: Secondary | ICD-10-CM

## 2022-01-25 DIAGNOSIS — Z96651 Presence of right artificial knee joint: Secondary | ICD-10-CM

## 2022-01-25 MED ORDER — IBUPROFEN 800 MG PO TABS
800.0000 mg | ORAL_TABLET | Freq: Three times a day (TID) | ORAL | 1 refills | Status: DC | PRN
Start: 2022-01-25 — End: 2022-02-22

## 2022-01-25 NOTE — ED Provider Notes (Signed)
[facility] EMERGENCY DEPARTMENT Provider Note   CSN: [id number] Arrival date & time: 01/25/22  0419     History  Chief Complaint  Patient presents with   Multiple Complaints    Terry Case is a 56 y.o. male.  The history is provided by the patient.   Patient presents for multiple complaints.  He reports for at least 2 weeks he has had left shoulder and left arm pain.  No trauma.  It hurts worse at night when he lies on the left side.  It also hurts to move the left arm.  No neck pain. No associated chest pain at this time. He reports mild headaches recently, none at this time.  He reports his headache seems worse when he is out in the heat  no focal weakness He reports tonight he googled his symptoms about left arm pain and decided to be evaluated  No new visual changes-reports recent evaluation by ophthalmologist and has been using eyedrops  Past Medical History:  Diagnosis Date   Acid reflux    Arthritis    Asthma    Chronic back pain    Diabetes mellitus without complication (HCC)    Eczema    GERD (gastroesophageal reflux disease)    Hypertension    Obstructive sleep apnea    Pneumonia      Home Medications Prior to Admission medications   Medication Sig Start Date End Date Taking? Authorizing Provider  albuterol (PROVENTIL HFA;VENTOLIN HFA) 108 (90 Base) MCG/ACT inhaler INHALE 2 PUFFS BY MOUTH EVERY 4 HOURS AS NEEDED IF YOU CAN'T CATCH YOUR BREATH 04/28/18   Lauraine Rinne, NP  amLODipine (NORVASC) 5 MG tablet Take 5 mg by mouth daily. 01/20/19   [provider]  aspirin EC 325 MG EC tablet Take 1 tablet (325 mg total) by mouth daily with breakfast. 03/22/21   Carole Civil, MD  atorvastatin (LIPITOR) 20 MG tablet Take 20 mg by mouth at bedtime. 04/09/19   [provider]  celecoxib (CELEBREX) 200 MG capsule TAKE 1 CAPSULE(200 MG) BY MOUTH DAILY 10/09/21   Carole Civil, MD  diphenhydrAMINE (BENADRYL) 25 MG tablet Take 50 mg by  mouth in the morning.    [provider]  dorzolamide (TRUSOPT) 2 % ophthalmic solution Place 1 drop into both eyes 2 (two) times daily.    [provider]  famotidine (PEPCID) 20 MG tablet Take 20 mg by mouth daily.    [provider]  halobetasol (ULTRAVATE) 0.05 % cream Apply 1 application topically 2 (two) times daily as needed (eczema).  07/21/17   [provider]  HYDROcodone-acetaminophen (NORCO/VICODIN) 5-325 MG tablet Take 1 tablet by mouth every 6 (six) hours as needed for moderate pain. 01/22/22   Carole Civil, MD  ibuprofen (ADVIL) 800 MG tablet Take 1 tablet (800 mg total) by mouth every 8 (eight) hours as needed. 09/28/21   Carole Civil, MD  JANUMET XR 303 139 2362 MG TB24 Take 1 tablet by mouth every morning.  06/17/17   [provider]  methocarbamol (ROBAXIN) 500 MG tablet Take 1 tablet (500 mg total) by mouth every 6 (six) hours as needed for muscle spasms. 03/22/21   Carole Civil, MD  Misc. Devices (3-IN-1 BEDSIDE TOILET) MISC Use after knee replacement M17.0 osteoarthritis knee length of need 32mo9/21/22   HCarole Civil MD  naloxone ([facility] nasal spray 4 mg/0.1 mL SMARTSIG:Both Nares 03/22/21   [provider]  Netarsudil-Latanoprost (ROCKLATAN) 0.02-0.005 %  SOLN Place 1 drop into both eyes at bedtime.    [provider]      Allergies    Fish allergy, Tomato, and Vicodin [hydrocodone-acetaminophen]    Review of Systems   Review of Systems  Constitutional:  Negative for fever.  Musculoskeletal:  Positive for arthralgias.  Neurological:  Negative for weakness.    Physical Exam Updated Vital Signs BP 130/84 (BP Location: Right Arm)   Pulse 62   Temp 97.7 F (36.5 C) (Oral)   Resp 16   Ht 1.727 m ('5\' 8"'$ )   Wt 117.9 kg   SpO2 94%   BMI 39.53 kg/m  Physical Exam CONSTITUTIONAL: Well developed/well nourished HEAD: Normocephalic/atraumatic EYES: EOMI/PERRL, no nystagmus, no ptosis,  subconjunctival hemorrhage noted in the right eye  ENMT: Mucous membranes moist NECK: supple no meningeal signs, no bruits SPINE/BACK:entire spine nontender CV: S1/S2 noted, no murmurs/rubs/gallops noted LUNGS: Lungs are clear to auscultation bilaterally, no apparent distress ABDOMEN: soft, nontender, no rebound or guarding GU:no cva tenderness NEURO:Awake/alert, face symmetric, no arm or leg drift is noted Equal 5/5 strength with shoulder abduction, elbow flex/extension, wrist flex/extension in upper extremities and equal hand grips bilaterally Equal 5/5 strength with hip flexion,knee flex/extension, foot dorsi/plantar flexion Cranial nerves 3/4/5/6/01/07/09/11/12 tested and intact Sensation to light touch intact in all extremities EXTREMITIES: pulses normal, full ROM, mild tenderness noted to the left shoulder.  No overlying erythema, no bruising or crepitus.  Distal pulses are equal and intact in both arms.  There is no edema to the upper extremities. Patient reports pain is exacerbated when he lifts his left arm above his head or he reaches to his right arm. SKIN: warm, color normal PSYCH: no abnormalities of mood noted, alert and oriented to situation  ED Results / Procedures / Treatments   Labs (all labs ordered are listed, but only abnormal results are displayed) Labs Reviewed - No data to display  EKG EKG Interpretation  Date/Time:  Thursday January 25 2022 04:35:02 EDT Ventricular Rate:  61 PR Interval:  163 QRS Duration: 105 QT Interval:  418 QTC Calculation: 421 R Axis:   83 Text Interpretation: Sinus rhythm No significant change since last tracing Confirmed by Ripley Fraise [phone]) on 01/25/2022 4:51:11 AM  Radiology DG Chest 2 View  Result Date: 01/25/2022 CLINICAL DATA:  57 year old male with chest, shoulder and left arm pain. Former smoker. EXAM: CHEST - 2 VIEW COMPARISON:  Chest CT 09/22/2019 and earlier. FINDINGS: Lung volumes and mediastinal contours are stable  since 2017, within normal limits. Visualized tracheal air column is within normal limits. Lung markings appear stable. No pneumothorax, pleural effusion or confluent pulmonary opacity. Mild chronic increased interstitial markings. Negative visible bowel gas. No acute osseous abnormality identified. Chronic bilateral shoulder degeneration. IMPRESSION: No acute cardiopulmonary abnormality. Electronically Signed   By: Genevie Ann M.D.   On: 01/25/2022 04:57    Procedures Procedures    Medications Ordered in ED Medications - No data to display  ED Course/ Medical Decision Making/ A&P                           Medical Decision Making Amount and/or Complexity of Data Reviewed Radiology: ordered.   Patient reports left arm pain for at least 2 weeks that is worse with movement and lying on his left side.  Tonight when he got on the Internet and looked at the symptoms he became concerned and came to the ER.  He is  not having any active chest pain at this time.  He has had no headaches at this time. Patient is in no distress at this time.  Strong suspicion this is arthritic changes in the shoulder Patient is already known to orthopedics and just received prescription for pain medications He is safe for discharge home and outpatient followup        Final Clinical Impression(s) / ED Diagnoses Final diagnoses:  Acute pain of left shoulder    Rx / DC Orders ED Discharge Orders     None         Ripley Fraise, MD 01/25/22 [phone]

## 2022-01-25 NOTE — Telephone Encounter (Signed)
Ibuprofen 800 MG   Take 1 tablet (800 MG) every 8 (eight) hours as needed.  PATIENT USES [facility] ON [address]

## 2022-01-25 NOTE — ED Triage Notes (Signed)
Pt c/o headaches, left arm and shoulder pain and chest pain for the past several days.

## 2022-01-29 DIAGNOSIS — I10 Essential (primary) hypertension: Secondary | ICD-10-CM | POA: Diagnosis not present

## 2022-01-29 DIAGNOSIS — E1165 Type 2 diabetes mellitus with hyperglycemia: Secondary | ICD-10-CM | POA: Diagnosis not present

## 2022-01-29 DIAGNOSIS — K219 Gastro-esophageal reflux disease without esophagitis: Secondary | ICD-10-CM | POA: Diagnosis not present

## 2022-02-12 ENCOUNTER — Ambulatory Visit (INDEPENDENT_AMBULATORY_CARE_PROVIDER_SITE_OTHER): Payer: Medicare Other

## 2022-02-12 ENCOUNTER — Encounter: Payer: Self-pay | Admitting: Orthopedic Surgery

## 2022-02-12 ENCOUNTER — Ambulatory Visit (INDEPENDENT_AMBULATORY_CARE_PROVIDER_SITE_OTHER): Payer: Medicare Other | Admitting: Orthopedic Surgery

## 2022-02-12 VITALS — BP 161/87 | HR 83 | Ht 68.0 in | Wt 279.0 lb

## 2022-02-12 DIAGNOSIS — M79602 Pain in left arm: Secondary | ICD-10-CM

## 2022-02-12 DIAGNOSIS — M4722 Other spondylosis with radiculopathy, cervical region: Secondary | ICD-10-CM | POA: Diagnosis not present

## 2022-02-12 DIAGNOSIS — R200 Anesthesia of skin: Secondary | ICD-10-CM

## 2022-02-12 DIAGNOSIS — M19012 Primary osteoarthritis, left shoulder: Secondary | ICD-10-CM

## 2022-02-12 DIAGNOSIS — R202 Paresthesia of skin: Secondary | ICD-10-CM

## 2022-02-12 MED ORDER — METHYLPREDNISOLONE ACETATE 40 MG/ML IJ SUSP
40.0000 mg | Freq: Once | INTRAMUSCULAR | Status: AC
  Administered 2022-02-12: 40 mg via INTRA_ARTICULAR

## 2022-02-12 NOTE — Addendum Note (Signed)
Addended byCandice Camp on: 02/12/2022 02:41 PM   Modules accepted: Orders

## 2022-02-12 NOTE — Progress Notes (Signed)
Chief Complaint  Patient presents with   Arm Pain    Left, no injury been hurting about a month had numbness past elbow, sleeps on that side   MEDS RELATED:  NORCO  IBUPROFEN  56 year old male presents with atraumatic onset of left shoulder pain recently went to the ER to be evaluated for possible chest pain related to the left arm symptoms this proved negative  No imaging was done of his left shoulder.  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 After pinning the skin with alcohol and anesthetized the skin with ethyl chloride the subacromial space was injected using a 20-gauge needle. There were no complications  Sterile dressing was applied.

## 2022-02-22 ENCOUNTER — Ambulatory Visit (HOSPITAL_COMMUNITY): Payer: Medicare Other | Attending: Orthopedic Surgery | Admitting: Occupational Therapy

## 2022-02-22 ENCOUNTER — Encounter (HOSPITAL_COMMUNITY): Payer: Self-pay | Admitting: Occupational Therapy

## 2022-02-22 ENCOUNTER — Other Ambulatory Visit: Payer: Self-pay

## 2022-02-22 DIAGNOSIS — Z96651 Presence of right artificial knee joint: Secondary | ICD-10-CM

## 2022-02-22 DIAGNOSIS — M1712 Unilateral primary osteoarthritis, left knee: Secondary | ICD-10-CM

## 2022-02-22 DIAGNOSIS — R29898 Other symptoms and signs involving the musculoskeletal system: Secondary | ICD-10-CM | POA: Insufficient documentation

## 2022-02-22 DIAGNOSIS — M25512 Pain in left shoulder: Secondary | ICD-10-CM | POA: Insufficient documentation

## 2022-02-22 DIAGNOSIS — M1711 Unilateral primary osteoarthritis, right knee: Secondary | ICD-10-CM

## 2022-02-22 MED ORDER — HYDROCODONE-ACETAMINOPHEN 5-325 MG PO TABS: 1.0000 | ORAL_TABLET | Freq: Four times a day (QID) | ORAL | 0 refills | Status: DC | PRN

## 2022-02-22 MED ORDER — IBUPROFEN 800 MG PO TABS
800.0000 mg | ORAL_TABLET | Freq: Three times a day (TID) | ORAL | 1 refills | Status: DC | PRN
Start: 2022-02-22 — End: 2022-03-29

## 2022-02-22 NOTE — Patient Instructions (Signed)
Repeat all exercises 10-15 times, 1-2 times per day. Use a water bottle or a soup can for a weight.   1) Shoulder Protraction    Begin with elbows by your side, slowly "punch" straight out in front of you.      2) Shoulder Flexion  Standing:         Begin with arms at your side with thumbs pointed up, slowly raise both arms up and forward towards overhead.               3) Horizontal abduction/adduction  Standing:           Begin with arms straight out in front of you, bring out to the side in at "T" shape. Keep arms straight entire time.                 4) Internal & External Rotation   Standing:     Stand with elbows at the side and elbows bent 90 degrees. Move your forearms away from your body, then bring back inward toward the body.     5) Shoulder Abduction  Standing:       Lying on your back begin with your arms flat on the table next to your side. Slowly move your arms out to the side so that they go overhead, in a jumping jack or snow angel movement.

## 2022-02-22 NOTE — Telephone Encounter (Signed)
Hydrocodone-Acetaminophen 5/325 MG Qty 56 Tablets        Take 1 tablet by mouth every 6 (six) hours as needed for moderate pain.   Ibuprofen 800 MG  Qty 90 Tablets   Take 1 tablet (800 mg total) by mouth every 8 (eight) hours as needed.       PATIENT USES [facility] ON [address] 
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
## 2022-03-01 ENCOUNTER — Encounter (HOSPITAL_COMMUNITY): Payer: Self-pay

## 2022-03-01 ENCOUNTER — Ambulatory Visit (HOSPITAL_COMMUNITY): Payer: Medicare Other

## 2022-03-01 DIAGNOSIS — M25512 Pain in left shoulder: Secondary | ICD-10-CM

## 2022-03-01 DIAGNOSIS — R29898 Other symptoms and signs involving the musculoskeletal system: Secondary | ICD-10-CM
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
## 2022-03-08 ENCOUNTER — Encounter (HOSPITAL_COMMUNITY): Payer: Self-pay

## 2022-03-08 ENCOUNTER — Ambulatory Visit (HOSPITAL_COMMUNITY): Payer: Medicare Other | Attending: Orthopedic Surgery

## 2022-03-08 DIAGNOSIS — R29898 Other symptoms and signs involving the musculoskeletal system: Secondary | ICD-10-CM | POA: Diagnosis not present

## 2022-03-08 DIAGNOSIS — M25512 Pain in left shoulder: Secondary | ICD-10-CM | POA: Insufficient documentation
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
## 2022-03-13 ENCOUNTER — Ambulatory Visit: Payer: Medicare Other | Admitting: Nutrition

## 2022-03-14 ENCOUNTER — Other Ambulatory Visit: Payer: Self-pay

## 2022-03-14 DIAGNOSIS — Z96651 Presence of right artificial knee joint: Secondary | ICD-10-CM

## 2022-03-14 DIAGNOSIS — M1712 Unilateral primary osteoarthritis, left knee: Secondary | ICD-10-CM

## 2022-03-14 DIAGNOSIS — M1711 Unilateral primary osteoarthritis, right knee: Secondary | ICD-10-CM

## 2022-03-14 MED ORDER — HYDROCODONE-ACETAMINOPHEN 5-325 MG PO TABS: 1.0000 | ORAL_TABLET | Freq: Four times a day (QID) | ORAL | 0 refills | Status: DC | PRN

## 2022-03-14 NOTE — Telephone Encounter (Signed)
Hydrocodone-Acetaminophen 5/325 MG  Qty 56 Tablets   Take 1 tablet by mouth every 6 (six) hours as needed for moderate pain.   PATIENT USES [facility] ON [address]   

## 2022-03-19 DIAGNOSIS — E1140 Type 2 diabetes mellitus with diabetic neuropathy, unspecified: Secondary | ICD-10-CM | POA: Diagnosis not present

## 2022-03-19 DIAGNOSIS — L110 Acquired keratosis follicularis: Secondary | ICD-10-CM | POA: Diagnosis not present

## 2022-03-19 DIAGNOSIS — M79672 Pain in left foot: Secondary | ICD-10-CM | POA: Diagnosis not present

## 2022-03-19 DIAGNOSIS — M79675 Pain in left toe(s): Secondary | ICD-10-CM | POA: Diagnosis not present

## 2022-03-19 DIAGNOSIS — M79674 Pain in right toe(s): Secondary | ICD-10-CM | POA: Diagnosis not present

## 2022-03-19 DIAGNOSIS — M79671 Pain in right foot: Secondary | ICD-10-CM | POA: Diagnosis not present

## 2022-03-20 ENCOUNTER — Encounter (HOSPITAL_COMMUNITY): Payer: Self-pay | Admitting: Occupational Therapy

## 2022-03-20 ENCOUNTER — Ambulatory Visit (HOSPITAL_COMMUNITY): Payer: Medicare Other | Admitting: Occupational Therapy

## 2022-03-20 DIAGNOSIS — M25512 Pain in left shoulder: Secondary | ICD-10-CM | POA: Diagnosis not present

## 2022-03-20 DIAGNOSIS — R29898 Other symptoms and signs involving the musculoskeletal system: Secondary | ICD-10-CM
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
## 2022-03-26 ENCOUNTER — Ambulatory Visit: Payer: Medicare Other | Admitting: Orthopedic Surgery

## 2022-03-26 NOTE — Progress Notes (Deleted)
FOLLOW UP   Encounter Diagnoses  Name Primary?   Left arm pain Yes   Primary osteoarthritis, left shoulder      No chief complaint on file.  Allergies  Allergen Reactions   Fish Allergy Hives and Swelling   Tomato Hives and Swelling   Vicodin [Hydrocodone-Acetaminophen] Itching and Rash    Can take Vicodin as long as he takes benadryl    Current Outpatient Medications  Medication Instructions   albuterol (PROVENTIL HFA;VENTOLIN HFA) 108 (90 Base) MCG/ACT inhaler INHALE 2 PUFFS BY MOUTH EVERY 4 HOURS AS NEEDED IF YOU CAN'T CATCH YOUR BREATH   amLODipine (NORVASC) 5 mg, Oral, Daily   aspirin EC 325 mg, Oral, Daily with breakfast   atorvastatin (LIPITOR) 20 mg, Oral, Daily at bedtime   diphenhydrAMINE (BENADRYL) 50 mg, Oral, Every morning   dorzolamide (TRUSOPT) 2 % ophthalmic solution 1 drop, Both Eyes, 2 times daily   famotidine (PEPCID) 20 mg, Oral, Daily   halobetasol (ULTRAVATE) 4.15 % cream 1 application , Topical, 2 times daily PRN   HYDROcodone-acetaminophen (NORCO/VICODIN) 5-325 MG tablet 1 tablet, Oral, Every 6 hours PRN   ibuprofen (ADVIL) 800 mg, Oral, Every 8 hours PRN   JANUMET XR [id number] MG TB24 1 tablet, Oral, BH-each morning   methocarbamol (ROBAXIN) 500 mg, Oral, Every 6 hours PRN   Misc. Devices (3-IN-1 BEDSIDE TOILET) MISC Use after knee replacement M17.0 osteoarthritis knee length of need 35mo  naloxone (NARCAN) nasal spray 4 mg/0.1 mL SMARTSIG:Both Nares   Netarsudil-Latanoprost (ROCKLATAN) 0.02-0.005 % SOLN 1 drop, Both Eyes, Daily at bedtime     Mr. DTiajuana Amasscomes in for 6-week follow-up he presented with left arm pain without any history of injury.  He was evaluated in the ER for possible chest pain that was normal and increased pain with lifting his arm over his head he had some tingling associated with this and was ruled out for heart attack his evaluation here revealed that he had some arthritis in [facility] and glenohumeral joint his rotator cuff  seem to be intact he was given a subacromial injection and started on a physical therapy program he is here for follow-up

## 2022-03-28 ENCOUNTER — Ambulatory Visit (HOSPITAL_COMMUNITY): Payer: Medicare Other | Admitting: Occupational Therapy

## 2022-03-28 DIAGNOSIS — M25512 Pain in left shoulder: Secondary | ICD-10-CM

## 2022-03-28 DIAGNOSIS — R29898 Other symptoms and signs involving the musculoskeletal system: Secondary | ICD-10-CM
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
## 2022-03-29 ENCOUNTER — Encounter: Payer: Self-pay | Admitting: Orthopedic Surgery

## 2022-03-29 ENCOUNTER — Ambulatory Visit (INDEPENDENT_AMBULATORY_CARE_PROVIDER_SITE_OTHER): Payer: Medicare Other

## 2022-03-29 ENCOUNTER — Ambulatory Visit (INDEPENDENT_AMBULATORY_CARE_PROVIDER_SITE_OTHER): Payer: Medicare Other | Admitting: Orthopedic Surgery

## 2022-03-29 ENCOUNTER — Telehealth: Payer: Self-pay | Admitting: Radiology

## 2022-03-29 VITALS — BP 132/86 | HR 76 | Ht 68.0 in | Wt 279.0 lb

## 2022-03-29 DIAGNOSIS — M1711 Unilateral primary osteoarthritis, right knee: Secondary | ICD-10-CM | POA: Diagnosis not present

## 2022-03-29 DIAGNOSIS — M1712 Unilateral primary osteoarthritis, left knee: Secondary | ICD-10-CM | POA: Diagnosis not present

## 2022-03-29 DIAGNOSIS — Z96651 Presence of right artificial knee joint: Secondary | ICD-10-CM

## 2022-03-29 DIAGNOSIS — M25512 Pain in left shoulder: Secondary | ICD-10-CM

## 2022-03-29 DIAGNOSIS — M19012 Primary osteoarthritis, left shoulder: Secondary | ICD-10-CM

## 2022-03-29 DIAGNOSIS — G8929 Other chronic pain: Secondary | ICD-10-CM | POA: Diagnosis not present

## 2022-03-29 DIAGNOSIS — G894 Chronic pain syndrome: Secondary | ICD-10-CM | POA: Diagnosis not present

## 2022-03-29 MED ORDER — IBUPROFEN 800 MG PO TABS: 800.0000 mg | ORAL_TABLET | Freq: Three times a day (TID) | ORAL | 1 refills | Status: DC | PRN

## 2022-03-29 MED ORDER — HYDROCODONE-ACETAMINOPHEN 5-325 MG PO TABS: 1.0000 | ORAL_TABLET | Freq: Four times a day (QID) | ORAL | 0 refills | Status: DC | PRN

## 2022-03-29 NOTE — Telephone Encounter (Signed)
Injection shoulder left intra articular

## 2022-03-29 NOTE — Telephone Encounter (Signed)
Tues 10/3  1045 for injection

## 2022-03-29 NOTE — Progress Notes (Signed)
Chief Complaint  Patient presents with   Shoulder Pain    Left / was scheduled for knees but states wants shoulder instead, has more pain with left shoulder pain into chest at night states will see primary care tomorrow.    Medication Refill    Hydrocodone and Ibuprofen    56 year old male comes in with left shoulder pain.  He had physical therapy subacromial injection he is on ibuprofen and hydrocodone.  He continues with pain mainly at night when he lies down.  He has also had some atypical chest pain was evaluated in the emergency room and will follow-up with primary care to address  In terms of his shoulder he has no recent onset of trauma.  Seems to just has the pain when he is lying down has not lost any range of motion in his range of motion does not produce any significant discomfort  At this point we offered him intra-articular injection which she is agreeable to  We will arrange for intra-articular injection left shoulder joint and set up appointment for 2 weeks after the injection to see if his pain has improved  He has refills today of ibuprofen and hydrocodone  Encounter Diagnoses  Name Primary?   Chronic left shoulder pain Yes   Arthritis of left shoulder region    Chronic pain syndrome     Meds ordered this encounter  Medications   HYDROcodone-acetaminophen (NORCO/VICODIN) 5-325 MG tablet    Sig: Take 1 tablet by mouth every 6 (six) hours as needed for moderate pain.    Dispense:  56 tablet    Refill:  0   ibuprofen (ADVIL) 800 MG tablet    Sig: Take 1 tablet (800 mg total) by mouth every 8 (eight) hours as needed.    Dispense:  90 tablet    Refill:  1

## 2022-03-30 ENCOUNTER — Ambulatory Visit: Payer: Medicare Other | Admitting: Orthopedic Surgery

## 2022-03-30 ENCOUNTER — Encounter (HOSPITAL_COMMUNITY): Payer: Self-pay | Admitting: Occupational Therapy

## 2022-03-30 DIAGNOSIS — E1169 Type 2 diabetes mellitus with other specified complication: Secondary | ICD-10-CM | POA: Diagnosis not present

## 2022-03-30 DIAGNOSIS — E118 Type 2 diabetes mellitus with unspecified complications: Secondary | ICD-10-CM | POA: Diagnosis not present

## 2022-03-30 DIAGNOSIS — M25512 Pain in left shoulder: Secondary | ICD-10-CM | POA: Diagnosis not present

## 2022-03-30 DIAGNOSIS — E785 Hyperlipidemia, unspecified: Secondary | ICD-10-CM | POA: Diagnosis not present

## 2022-03-30 DIAGNOSIS — I10 Essential (primary) hypertension: Secondary | ICD-10-CM | POA: Diagnosis not present

## 2022-03-30 NOTE — Telephone Encounter (Signed)
I called him to advise he voiced understanding

## 2022-04-03 ENCOUNTER — Ambulatory Visit (HOSPITAL_COMMUNITY)
Admission: RE | Admit: 2022-04-03 | Discharge: 2022-04-03 | Disposition: A | Discharge status: Home / Self Care | Payer: Medicare Other | Source: Ambulatory Visit | Attending: Orthopedic Surgery | Admitting: Orthopedic Surgery

## 2022-04-03 ENCOUNTER — Encounter (HOSPITAL_COMMUNITY): Payer: Self-pay

## 2022-04-03 DIAGNOSIS — M25512 Pain in left shoulder: Secondary | ICD-10-CM | POA: Insufficient documentation

## 2022-04-03 DIAGNOSIS — G8929 Other chronic pain: Secondary | ICD-10-CM | POA: Insufficient documentation

## 2022-04-03 MED ORDER — IOHEXOL 180 MG/ML  SOLN
INTRAMUSCULAR | Status: AC
  Administered 2022-04-03: 15 mL
  Filled 2022-04-03: qty 20

## 2022-04-03 MED ORDER — POVIDONE-IODINE 10 % EX SOLN
CUTANEOUS | Status: AC
  Administered 2022-04-03: 1
  Filled 2022-04-03: qty 14.8

## 2022-04-03 MED ORDER — BUPIVACAINE HCL (PF) 0.5 % IJ SOLN
INTRAMUSCULAR | Status: AC
  Administered 2022-04-03: 5 mL
  Filled 2022-04-03: qty 30

## 2022-04-03 MED ORDER — METHYLPREDNISOLONE ACETATE 40 MG/ML IJ SUSP
INTRAMUSCULAR | Status: AC
  Administered 2022-04-03: 40 mg
  Filled 2022-04-03: qty 1

## 2022-04-03 MED ORDER — LIDOCAINE HCL (PF) 1 % IJ SOLN
INTRAMUSCULAR | Status: AC
  Administered 2022-04-03: 5 mL
  Filled 2022-04-03: qty 5

## 2022-04-03 NOTE — Procedures (Signed)
Preprocedure Dx: Chronic left shoulder pain Postprocedure Dx: Chronic left shoulder pain Procedure  Fluoroscopically guided therapeutic LEFT shoulder joint injection Radiologist:  Thornton Papas Anesthesia:  3 ml of 1% lidocaine Injectate:  40 mg Depo-Medrol and 3 ml Sensorcaine 0.5% Fluoro time:  0 minutes 24 seconds EBL:   None Complications:  None

## 2022-04-05 ENCOUNTER — Ambulatory Visit (INDEPENDENT_AMBULATORY_CARE_PROVIDER_SITE_OTHER): Payer: Medicare Other | Admitting: Orthopedic Surgery

## 2022-04-05 ENCOUNTER — Encounter: Payer: Self-pay | Admitting: Orthopedic Surgery

## 2022-04-05 DIAGNOSIS — G8929 Other chronic pain: Secondary | ICD-10-CM

## 2022-04-05 DIAGNOSIS — M25512 Pain in left shoulder: Secondary | ICD-10-CM

## 2022-04-05 DIAGNOSIS — Z96651 Presence of right artificial knee joint: Secondary | ICD-10-CM

## 2022-04-05 NOTE — Progress Notes (Signed)
FOLLOW UP   Encounter Diagnoses  Name Primary?   Chronic left shoulder pain Yes   S/P total knee replacement, right 03/21/21      Chief Complaint  Patient presents with   Shoulder Pain    Left/ feeling better     Terry Case had an injection in his left shoulder for osteoarthritis he says it is feeling better  We will follow-up in 1 month  He is also status post right total knee he wanted his right knee checked  He has full extension good strength no instability he has some crepitance in the patellofemoral region but is not painful he is walking without support there is no limp or effusion  Follow-up per routine

## 2022-04-12 ENCOUNTER — Ambulatory Visit: Payer: Medicare Other | Admitting: Orthopedic Surgery

## 2022-04-24 ENCOUNTER — Other Ambulatory Visit: Payer: Self-pay | Admitting: Orthopedic Surgery

## 2022-04-24 DIAGNOSIS — M1712 Unilateral primary osteoarthritis, left knee: Secondary | ICD-10-CM

## 2022-04-24 DIAGNOSIS — M1711 Unilateral primary osteoarthritis, right knee: Secondary | ICD-10-CM

## 2022-04-24 DIAGNOSIS — Z96651 Presence of right artificial knee joint: Secondary | ICD-10-CM

## 2022-04-24 MED ORDER — HYDROCODONE-ACETAMINOPHEN 5-325 MG PO TABS: 1.0000 | ORAL_TABLET | Freq: Four times a day (QID) | ORAL | 0 refills | Status: DC | PRN

## 2022-04-24 NOTE — Telephone Encounter (Signed)
Patient came to office to request refill:  HYDROcodone-acetaminophen (NORCO/VICODIN) 5-325 MG tablet 56 tablet      [employer], [employer], [place]

## 2022-04-27 DIAGNOSIS — R051 Acute cough: Secondary | ICD-10-CM | POA: Diagnosis not present

## 2022-04-27 DIAGNOSIS — I10 Essential (primary) hypertension: Secondary | ICD-10-CM | POA: Diagnosis not present

## 2022-04-27 DIAGNOSIS — E118 Type 2 diabetes mellitus with unspecified complications: Secondary | ICD-10-CM | POA: Diagnosis not present

## 2022-05-07 ENCOUNTER — Ambulatory Visit (INDEPENDENT_AMBULATORY_CARE_PROVIDER_SITE_OTHER): Payer: Medicare Other | Admitting: Orthopedic Surgery

## 2022-05-07 ENCOUNTER — Ambulatory Visit: Payer: Self-pay

## 2022-05-07 ENCOUNTER — Encounter: Payer: Self-pay | Admitting: Orthopedic Surgery

## 2022-05-07 ENCOUNTER — Ambulatory Visit (INDEPENDENT_AMBULATORY_CARE_PROVIDER_SITE_OTHER): Payer: Medicare Other

## 2022-05-07 DIAGNOSIS — M1711 Unilateral primary osteoarthritis, right knee: Secondary | ICD-10-CM

## 2022-05-07 DIAGNOSIS — G8929 Other chronic pain: Secondary | ICD-10-CM | POA: Diagnosis not present

## 2022-05-07 DIAGNOSIS — G894 Chronic pain syndrome: Secondary | ICD-10-CM

## 2022-05-07 DIAGNOSIS — Z96651 Presence of right artificial knee joint: Secondary | ICD-10-CM | POA: Diagnosis not present

## 2022-05-07 DIAGNOSIS — M25512 Pain in left shoulder: Secondary | ICD-10-CM

## 2022-05-07 DIAGNOSIS — M1712 Unilateral primary osteoarthritis, left knee: Secondary | ICD-10-CM | POA: Diagnosis not present

## 2022-05-07 MED ORDER — HYDROCODONE-ACETAMINOPHEN 5-325 MG PO TABS: 1.0000 | ORAL_TABLET | Freq: Four times a day (QID) | ORAL | 0 refills | Status: DC | PRN

## 2022-05-07 NOTE — Progress Notes (Signed)
FOLLOW UP   Encounter Diagnoses  Name Primary?   S/P total knee replacement, right Yes   Primary osteoarthritis of right knee    Chronic pain syndrome    Primary osteoarthritis of left knee    S/P total knee replacement, right 03/21/21    Chronic left shoulder pain      Chief Complaint  Patient presents with   Right Knee - Routine Post Op    1 year post op xray today 03/21/21 total knee    Left Shoulder - Follow-up   Medication Refill    Hydrocodone and Ibuprofen     Terry Case is doing well with his left shoulder status post injection  His right knee was doing well.  His left knee is somewhat painful but stable at this time  In terms of chronic pain he is doing well with the current hydrocodone every 6 hours as needed  X-rays show normal stable tka right knee   Meds ordered this encounter  Medications   HYDROcodone-acetaminophen (NORCO/VICODIN) 5-325 MG tablet    Sig: Take 1 tablet by mouth every 6 (six) hours as needed for moderate pain.    Dispense:  56 tablet    Refill:  0   Plan is for him to come back in a year for x-ray of his right total knee continue with his current pain management schedule with no adjustments needed

## 2022-05-15 DIAGNOSIS — E1169 Type 2 diabetes mellitus with other specified complication: Secondary | ICD-10-CM | POA: Diagnosis not present

## 2022-05-15 DIAGNOSIS — E785 Hyperlipidemia, unspecified: Secondary | ICD-10-CM | POA: Diagnosis not present

## 2022-05-15 DIAGNOSIS — I10 Essential (primary) hypertension: Secondary | ICD-10-CM | POA: Diagnosis not present

## 2022-05-29 ENCOUNTER — Other Ambulatory Visit: Payer: Self-pay | Admitting: Orthopedic Surgery

## 2022-05-29 DIAGNOSIS — M1711 Unilateral primary osteoarthritis, right knee: Secondary | ICD-10-CM

## 2022-05-29 DIAGNOSIS — M1712 Unilateral primary osteoarthritis, left knee: Secondary | ICD-10-CM

## 2022-05-29 DIAGNOSIS — Z96651 Presence of right artificial knee joint: Secondary | ICD-10-CM

## 2022-05-29 NOTE — Telephone Encounter (Unsigned)
Patient came in the office requesting a refill on the following medicines    HYDROcodone-acetaminophen (NORCO/VICODIN) 5-325 MG tablet    ibuprofen (ADVIL) 800 MG tablet    Pharmacy:  [facility] on [address]

## 2022-05-31 MED ORDER — HYDROCODONE-ACETAMINOPHEN 5-325 MG PO TABS: 1.0000 | ORAL_TABLET | Freq: Four times a day (QID) | ORAL | 0 refills | Status: DC | PRN

## 2022-06-12 DIAGNOSIS — I10 Essential (primary) hypertension: Secondary | ICD-10-CM | POA: Diagnosis not present

## 2022-06-12 DIAGNOSIS — E1169 Type 2 diabetes mellitus with other specified complication: Secondary | ICD-10-CM | POA: Diagnosis not present

## 2022-06-15 ENCOUNTER — Other Ambulatory Visit: Payer: Self-pay

## 2022-06-15 DIAGNOSIS — M1712 Unilateral primary osteoarthritis, left knee: Secondary | ICD-10-CM

## 2022-06-15 DIAGNOSIS — M1711 Unilateral primary osteoarthritis, right knee: Secondary | ICD-10-CM

## 2022-06-15 DIAGNOSIS — Z96651 Presence of right artificial knee joint: Secondary | ICD-10-CM

## 2022-06-15 NOTE — Telephone Encounter (Signed)
Hydrocodone-Acetaminophen 5/325 MG Qty 56 Tablets        Take 1 tablet by mouth every 6 (six) hours as needed for moderate pain.   Ibuprofen 800 MG  Qty 90 Tablets   Take 1 tablet (800 mg total) by mouth every 8 (eight) hours as needed.       PATIENT USES [facility] ON [address]

## 2022-06-18 DIAGNOSIS — E1140 Type 2 diabetes mellitus with diabetic neuropathy, unspecified: Secondary | ICD-10-CM | POA: Diagnosis not present

## 2022-06-18 DIAGNOSIS — M79675 Pain in left toe(s): Secondary | ICD-10-CM | POA: Diagnosis not present

## 2022-06-18 DIAGNOSIS — M79674 Pain in right toe(s): Secondary | ICD-10-CM | POA: Diagnosis not present

## 2022-06-18 DIAGNOSIS — M79672 Pain in left foot: Secondary | ICD-10-CM | POA: Diagnosis not present

## 2022-06-18 DIAGNOSIS — M79671 Pain in right foot: Secondary | ICD-10-CM | POA: Diagnosis not present

## 2022-06-18 DIAGNOSIS — L110 Acquired keratosis follicularis: Secondary | ICD-10-CM | POA: Diagnosis not present

## 2022-06-18 NOTE — Telephone Encounter (Signed)
Sent to provider 

## 2022-06-20 ENCOUNTER — Telehealth: Payer: Self-pay | Admitting: Orthopedic Surgery

## 2022-06-20 MED ORDER — HYDROCODONE-ACETAMINOPHEN 5-325 MG PO TABS: 1.0000 | ORAL_TABLET | Freq: Four times a day (QID) | ORAL | 0 refills | Status: DC | PRN

## 2022-06-20 MED ORDER — IBUPROFEN 800 MG PO TABS: 800.0000 mg | ORAL_TABLET | Freq: Three times a day (TID) | ORAL | 1 refills | Status: DC | PRN

## 2022-06-20 NOTE — Telephone Encounter (Signed)
error 

## 2022-06-20 NOTE — Telephone Encounter (Signed)
Noted, thanks all the pharmacies are running out, its on backorder.    Hydrocodone 10 has been out for weeks on a national back order.  Pharmacies are not out of the 5 mg since they have been using instead, now its in short supply.  [place] and our residents use up all we are allotted   The pharmacy is letting patients know when it will be in, but maybe not for a while

## 2022-06-20 NOTE — Telephone Encounter (Signed)
Patient came in the office stating we did not call his medicine in when he came into the office on the 15th.  I advised him it was called in today.  He is going to [facility] on [address] to see if they have it .... Dr. Aline Brochure just filled it today!!  He said he normally gets a text when it is ready... I said they may be out so he will have to call around and find out who has it and call us back so we can switch to a pharmacy that does have it

## 2022-07-06 ENCOUNTER — Other Ambulatory Visit: Payer: Self-pay | Admitting: Orthopedic Surgery

## 2022-07-06 DIAGNOSIS — M1712 Unilateral primary osteoarthritis, left knee: Secondary | ICD-10-CM

## 2022-07-06 DIAGNOSIS — Z96651 Presence of right artificial knee joint: Secondary | ICD-10-CM

## 2022-07-06 DIAGNOSIS — M1711 Unilateral primary osteoarthritis, right knee: Secondary | ICD-10-CM

## 2022-07-06 NOTE — Telephone Encounter (Signed)
Patient presented to the office requesting a refill for his Hydrocodone 5-325, 56 tablets, takes 1 every 6 hours to be sent to [facility] on [address].  Pt's # [phone]

## 2022-07-09 MED ORDER — HYDROCODONE-ACETAMINOPHEN 5-325 MG PO TABS: 1.0000 | ORAL_TABLET | Freq: Four times a day (QID) | ORAL | 0 refills | Status: DC | PRN

## 2022-07-18 DIAGNOSIS — H179 Unspecified corneal scar and opacity: Secondary | ICD-10-CM | POA: Diagnosis not present

## 2022-07-18 DIAGNOSIS — E119 Type 2 diabetes mellitus without complications: Secondary | ICD-10-CM | POA: Diagnosis not present

## 2022-07-18 DIAGNOSIS — H401131 Primary open-angle glaucoma, bilateral, mild stage: Secondary | ICD-10-CM | POA: Diagnosis not present

## 2022-07-18 DIAGNOSIS — H35033 Hypertensive retinopathy, bilateral: Secondary | ICD-10-CM | POA: Diagnosis not present

## 2022-07-18 DIAGNOSIS — H25813 Combined forms of age-related cataract, bilateral: Secondary | ICD-10-CM | POA: Diagnosis not present

## 2022-07-18 DIAGNOSIS — H527 Unspecified disorder of refraction: Secondary | ICD-10-CM | POA: Diagnosis not present

## 2022-07-25 ENCOUNTER — Other Ambulatory Visit: Payer: Self-pay

## 2022-07-25 DIAGNOSIS — Z96651 Presence of right artificial knee joint: Secondary | ICD-10-CM

## 2022-07-25 DIAGNOSIS — M1711 Unilateral primary osteoarthritis, right knee: Secondary | ICD-10-CM

## 2022-07-25 DIAGNOSIS — M1712 Unilateral primary osteoarthritis, left knee: Secondary | ICD-10-CM

## 2022-07-25 MED ORDER — HYDROCODONE-ACETAMINOPHEN 5-325 MG PO TABS: 1.0000 | ORAL_TABLET | Freq: Four times a day (QID) | ORAL | 0 refills | Status: DC | PRN

## 2022-07-25 NOTE — Telephone Encounter (Signed)
Hydrocodone-Acetaminphen  5/325 MG Qty 56 Tablets  Take 1 tablet by mouth every 6 (six) hours as needed for moderate pain.    Ibuprofen 800 MG Qty 90 Tablets  Take 1 tablet (800 mg total) by mouth every 8 (eight) hours as needed.     PATIENT USES [address] ON [address]

## 2022-07-27 DIAGNOSIS — I10 Essential (primary) hypertension: Secondary | ICD-10-CM | POA: Diagnosis not present

## 2022-07-27 DIAGNOSIS — Z713 Dietary counseling and surveillance: Secondary | ICD-10-CM | POA: Diagnosis not present

## 2022-07-27 DIAGNOSIS — E785 Hyperlipidemia, unspecified: Secondary | ICD-10-CM | POA: Diagnosis not present

## 2022-07-27 DIAGNOSIS — E1169 Type 2 diabetes mellitus with other specified complication: Secondary | ICD-10-CM | POA: Diagnosis not present

## 2022-08-06 ENCOUNTER — Other Ambulatory Visit: Payer: Self-pay | Admitting: Orthopedic Surgery

## 2022-08-06 DIAGNOSIS — Z96651 Presence of right artificial knee joint: Secondary | ICD-10-CM

## 2022-08-06 DIAGNOSIS — M1712 Unilateral primary osteoarthritis, left knee: Secondary | ICD-10-CM

## 2022-08-06 DIAGNOSIS — M1711 Unilateral primary osteoarthritis, right knee: Secondary | ICD-10-CM

## 2022-08-06 MED ORDER — HYDROCODONE-ACETAMINOPHEN 5-325 MG PO TABS: 1.0000 | ORAL_TABLET | Freq: Four times a day (QID) | ORAL | 0 refills | Status: DC | PRN

## 2022-08-06 MED ORDER — IBUPROFEN 800 MG PO TABS
800.0000 mg | ORAL_TABLET | Freq: Three times a day (TID) | ORAL | 1 refills | Status: DC | PRN
Start: 2022-08-06 — End: 2022-09-24

## 2022-08-06 NOTE — Telephone Encounter (Signed)
Patient presented to the office requesting a refill on Hydrocodone 5-325, 56 quantity, 1 every 6 hours and Ibuprofen '800mg'$  to be sent to [facility] on [address].  Pt's # [phone]

## 2022-08-10 ENCOUNTER — Emergency Department (HOSPITAL_COMMUNITY): Payer: 59

## 2022-08-10 ENCOUNTER — Emergency Department (HOSPITAL_COMMUNITY)
Admission: EM | Admit: 2022-08-10 | Discharge: 2022-08-10 | Disposition: A | Discharge status: Home / Self Care | Payer: 59 | Attending: Emergency Medicine | Admitting: Emergency Medicine

## 2022-08-10 DIAGNOSIS — Z7982 Long term (current) use of aspirin: Secondary | ICD-10-CM | POA: Insufficient documentation

## 2022-08-10 DIAGNOSIS — Z7984 Long term (current) use of oral hypoglycemic drugs: Secondary | ICD-10-CM | POA: Insufficient documentation

## 2022-08-10 DIAGNOSIS — J449 Chronic obstructive pulmonary disease, unspecified: Secondary | ICD-10-CM | POA: Diagnosis not present

## 2022-08-10 DIAGNOSIS — Z96653 Presence of artificial knee joint, bilateral: Secondary | ICD-10-CM | POA: Diagnosis not present

## 2022-08-10 DIAGNOSIS — I10 Essential (primary) hypertension: Secondary | ICD-10-CM | POA: Diagnosis not present

## 2022-08-10 DIAGNOSIS — Z79899 Other long term (current) drug therapy: Secondary | ICD-10-CM | POA: Insufficient documentation

## 2022-08-10 DIAGNOSIS — R42 Dizziness and giddiness: Secondary | ICD-10-CM | POA: Diagnosis not present

## 2022-08-10 LAB — CBC WITH DIFFERENTIAL/PLATELET
Abs Immature Granulocytes: 0 10*3/uL (ref 0.00–0.07)
Basophils Absolute: 0 10*3/uL (ref 0.0–0.1)
Basophils Relative: 1 %
Eosinophils Absolute: 0.2 10*3/uL (ref 0.0–0.5)
Eosinophils Relative: 5 %
HCT: 39.8 % (ref 39.0–52.0)
Hemoglobin: 12.6 g/dL — ABNORMAL LOW (ref 13.0–17.0)
Immature Granulocytes: 0 %
Lymphocytes Relative: 33 %
Lymphs Abs: 1.3 10*3/uL (ref 0.7–4.0)
MCH: 26.8 pg (ref 26.0–34.0)
MCHC: 31.7 g/dL (ref 30.0–36.0)
MCV: 84.5 fL (ref 80.0–100.0)
Monocytes Absolute: 0.4 10*3/uL (ref 0.1–1.0)
Monocytes Relative: 10 %
Neutro Abs: 2.1 10*3/uL (ref 1.7–7.7)
Neutrophils Relative %: 51 %
Platelets: 217 10*3/uL (ref 150–400)
RBC: 4.71 MIL/uL (ref 4.22–5.81)
RDW: 14.3 % (ref 11.5–15.5)
WBC: 4 10*3/uL (ref 4.0–10.5)
nRBC: 0 % (ref 0.0–0.2)

## 2022-08-10 LAB — BASIC METABOLIC PANEL
Anion gap: 8 (ref 5–15)
BUN: 21 mg/dL — ABNORMAL HIGH (ref 6–20)
CO2: 24 mmol/L (ref 22–32)
Calcium: 8.7 mg/dL — ABNORMAL LOW (ref 8.9–10.3)
Chloride: 103 mmol/L (ref 98–111)
Creatinine, Ser: 1.05 mg/dL (ref 0.61–1.24)
GFR, Estimated: >60 mL/min (ref >60)
Glucose, Bld: 131 mg/dL — ABNORMAL HIGH (ref 70–99)
Potassium: 3.9 mmol/L (ref 3.5–5.1)
Sodium: 135 mmol/L (ref 135–145)

## 2022-08-10 MED ORDER — MECLIZINE HCL 12.5 MG PO TABS
25.0000 mg | ORAL_TABLET | Freq: Once | ORAL | Status: AC
  Administered 2022-08-10: 25 mg via ORAL
  Filled 2022-08-10: qty 2

## 2022-08-10 MED ORDER — MECLIZINE HCL 25 MG PO TABS: 25.0000 mg | ORAL_TABLET | Freq: Three times a day (TID) | ORAL | 0 refills | Status: DC | PRN

## 2022-08-10 NOTE — Discharge Instructions (Signed)
Begin taking meclizine as prescribed.  Follow-up with your primary doctor in the next 1 to 2 weeks, and return to the ER if symptoms significantly worsen or change.

## 2022-08-10 NOTE — ED Triage Notes (Signed)
Pt went to bathroom 1h ago, laid back down, was very dizzy when he turned over. This has happened "four times in the last year and a half" States closing his eyes or turning the other way makes the dizziness go away.

## 2022-08-10 NOTE — ED Provider Notes (Signed)
[facility] Provider Note   CSN: [id number] Arrival date & time: 08/10/22  0507     History  Chief Complaint  Patient presents with   Dizziness    Terry Case is a 57 y.o. male.  Patient is a 57 year old male with past medical history of COPD, hypertension, GERD, total knee replacement.  Patient presenting today with complaints of dizziness.  He reports rolling over in bed this evening, then developing the sudden onset of a spinning sensation.  This seemed to resolve when he turned the opposite direction.  Several minutes later, the symptoms recurred when he moved.  He then checked his blood pressure and got readings of approximately [id number] systolic, then decided to come here for evaluation.  He denies to me he is having any headache, weakness of the arms or legs, visual disturbances, chest pain, or other complaints.  He reports he has had episodes like this over the past 2 years worse on probably 3 or 4 occasions.  The history is provided by the patient.       Home Medications Prior to Admission medications   Medication Sig Start Date End Date Taking? Authorizing Provider  albuterol (PROVENTIL HFA;VENTOLIN HFA) 108 (90 Base) MCG/ACT inhaler INHALE 2 PUFFS BY MOUTH EVERY 4 HOURS AS NEEDED IF YOU CAN'T CATCH YOUR BREATH 04/28/18   Lauraine Rinne, NP  amLODipine (NORVASC) 5 MG tablet Take 5 mg by mouth daily. 01/20/19   [provider]  aspirin EC 325 MG EC tablet Take 1 tablet (325 mg total) by mouth daily with breakfast. 03/22/21   Carole Civil, MD  atorvastatin (LIPITOR) 20 MG tablet Take 20 mg by mouth at bedtime. 04/09/19   [provider]  diphenhydrAMINE (BENADRYL) 25 MG tablet Take 50 mg by mouth in the morning.    [provider]  dorzolamide (TRUSOPT) 2 % ophthalmic solution Place 1 drop into both eyes 2 (two) times daily.    [provider]  famotidine (PEPCID) 20 MG tablet Take 20 mg by  mouth daily.    [provider]  halobetasol (ULTRAVATE) 0.05 % cream Apply 1 application topically 2 (two) times daily as needed (eczema).  07/21/17   [provider]  HYDROcodone-acetaminophen (NORCO/VICODIN) 5-325 MG tablet Take 1 tablet by mouth every 6 (six) hours as needed for moderate pain. 08/06/22   Carole Civil, MD  ibuprofen (ADVIL) 800 MG tablet Take 1 tablet (800 mg total) by mouth every 8 (eight) hours as needed. 08/06/22   Carole Civil, MD  JANUMET XR [phone] MG TB24 Take 1 tablet by mouth every morning.  06/17/17   [provider]  lisinopril (ZESTRIL) 10 MG tablet Take 10 mg by mouth daily. 03/15/22   [provider]  methocarbamol (ROBAXIN) 500 MG tablet Take 1 tablet (500 mg total) by mouth every 6 (six) hours as needed for muscle spasms. 03/22/21   Carole Civil, MD  Misc. Devices (3-IN-1 BEDSIDE TOILET) MISC Use after knee replacement M17.0 osteoarthritis knee length of need 22mo9/21/22   HCarole Civil MD  naloxone ([facility] nasal spray 4 mg/0.1 mL SMARTSIG:Both Nares 03/22/21   [provider]  Netarsudil-Latanoprost (ROCKLATAN) 0.02-0.005 % SOLN Place 1 drop into both eyes at bedtime.    [provider]      Allergies    Fish allergy, Tomato, and Vicodin [hydrocodone-acetaminophen]    Review of Systems   Review of Systems  All other  systems reviewed and are negative.   Physical Exam Updated Vital Signs BP 127/85 (BP Location: Right Arm)   Pulse 73   Temp 98.4 F (36.9 C) (Oral)   Resp 18   Ht 5' 8"$  (1.727 m)   Wt 121.6 kg   SpO2 96%   BMI 40.75 kg/m  Physical Exam Vitals and nursing note reviewed.  Constitutional:      General: He is not in acute distress.    Appearance: He is well-developed. He is not diaphoretic.  HENT:     Head: Normocephalic and atraumatic.     Right Ear: Tympanic membrane normal.     Left Ear: Tympanic membrane normal.  Eyes:     Extraocular Movements:  Extraocular movements intact.     Pupils: Pupils are equal, round, and reactive to light.  Cardiovascular:     Rate and Rhythm: Normal rate and regular rhythm.     Heart sounds: No murmur heard.    No friction rub.  Pulmonary:     Effort: Pulmonary effort is normal. No respiratory distress.     Breath sounds: Normal breath sounds. No wheezing or rales.  Abdominal:     General: Bowel sounds are normal. There is no distension.     Palpations: Abdomen is soft.     Tenderness: There is no abdominal tenderness.  Musculoskeletal:        General: Normal range of motion.     Cervical back: Normal range of motion and neck supple.  Skin:    General: Skin is warm and dry.  Neurological:     General: No focal deficit present.     Mental Status: He is alert and oriented to person, place, and time.     Cranial Nerves: No cranial nerve deficit.     Coordination: Coordination normal.     ED Results / Procedures / Treatments   Labs (all labs ordered are listed, but only abnormal results are displayed) Labs Reviewed - No data to display  EKG None  Radiology No results found.  Procedures Procedures    Medications Ordered in ED Medications  meclizine (ANTIVERT) tablet 25 mg (has no administration in time range)    ED Course/ Medical Decision Making/ A&P  Patient is a 57 year old male with past medical history as per HPI presenting with complaints of dizziness.  He describes a spinning sensation when he rolls over in bed and bends forward.  Patient arrives here with stable vital signs and physical examination that reveals him to be neurologically intact.  There are no acute findings on exam.  Workup initiated including CBC and metabolic panel.  These both returned unremarkable.  Head CT obtained to rule out intracranial abnormality.  This was negative.  Patient was given meclizine and seems to be feeling better.  He was initially concerned that his blood pressure may be elevated,  but was 0000000 systolic upon presentation.  I highly suspect symptoms are related to a peripheral vertigo.  They are positional and worsened with movement.  At this point, I feels the patient can safely be discharged.  I will prescribe meclizine which he can take at home.  He is to follow-up with primary doctor if he experiences additional problems.  Final Clinical Impression(s) / ED Diagnoses Final diagnoses:  None    Rx / DC Orders ED Discharge Orders     None         Veryl Speak, MD 08/10/22 [phone]

## 2022-08-20 ENCOUNTER — Other Ambulatory Visit: Payer: Self-pay

## 2022-08-20 DIAGNOSIS — M1712 Unilateral primary osteoarthritis, left knee: Secondary | ICD-10-CM

## 2022-08-20 DIAGNOSIS — Z96651 Presence of right artificial knee joint: Secondary | ICD-10-CM

## 2022-08-20 DIAGNOSIS — M1711 Unilateral primary osteoarthritis, right knee: Secondary | ICD-10-CM

## 2022-08-20 MED ORDER — HYDROCODONE-ACETAMINOPHEN 5-325 MG PO TABS: 1.0000 | ORAL_TABLET | Freq: Four times a day (QID) | ORAL | 0 refills | Status: DC | PRN

## 2022-08-20 NOTE — Telephone Encounter (Signed)
Hydrocodone-Acetaminophen 5/325 MG  Qty 56 Tablets   Take 1 tablet by mouth every 6 (six) hours as needed for moderate pain.   PATIENT USES [facility] ON [address]

## 2022-08-24 DIAGNOSIS — E1165 Type 2 diabetes mellitus with hyperglycemia: Secondary | ICD-10-CM | POA: Diagnosis not present

## 2022-08-24 DIAGNOSIS — H8111 Benign paroxysmal vertigo, right ear: Secondary | ICD-10-CM | POA: Diagnosis not present

## 2022-08-24 DIAGNOSIS — E785 Hyperlipidemia, unspecified: Secondary | ICD-10-CM | POA: Diagnosis not present

## 2022-08-24 DIAGNOSIS — I10 Essential (primary) hypertension: Secondary | ICD-10-CM | POA: Diagnosis not present

## 2022-08-24 DIAGNOSIS — Z0000 Encounter for general adult medical examination without abnormal findings: Secondary | ICD-10-CM | POA: Diagnosis not present

## 2022-08-30 ENCOUNTER — Encounter: Payer: Self-pay | Admitting: Radiology

## 2022-08-30 DIAGNOSIS — I10 Essential (primary) hypertension: Secondary | ICD-10-CM | POA: Diagnosis not present

## 2022-08-30 DIAGNOSIS — E1169 Type 2 diabetes mellitus with other specified complication: Secondary | ICD-10-CM | POA: Diagnosis not present

## 2022-08-30 DIAGNOSIS — E785 Hyperlipidemia, unspecified: Secondary | ICD-10-CM | POA: Diagnosis not present

## 2022-09-06 ENCOUNTER — Telehealth: Payer: Self-pay | Admitting: Orthopedic Surgery

## 2022-09-06 DIAGNOSIS — Z96651 Presence of right artificial knee joint: Secondary | ICD-10-CM

## 2022-09-06 DIAGNOSIS — M1711 Unilateral primary osteoarthritis, right knee: Secondary | ICD-10-CM

## 2022-09-06 DIAGNOSIS — M1712 Unilateral primary osteoarthritis, left knee: Secondary | ICD-10-CM

## 2022-09-06 MED ORDER — HYDROCODONE-ACETAMINOPHEN 5-325 MG PO TABS: 1.0000 | ORAL_TABLET | Freq: Four times a day (QID) | ORAL | 0 refills | Status: DC | PRN

## 2022-09-06 NOTE — Telephone Encounter (Signed)
Patient came in the office stating he needs a refill on his pain medicine    HYDROcodone-acetaminophen (NORCO/VICODIN) 5-325 MG tablet   ibuprofen (ADVIL) 800 MG tablet    Pharmacy   [facility] on [address]

## 2022-09-17 DIAGNOSIS — E1140 Type 2 diabetes mellitus with diabetic neuropathy, unspecified: Secondary | ICD-10-CM | POA: Diagnosis not present

## 2022-09-17 DIAGNOSIS — M79674 Pain in right toe(s): Secondary | ICD-10-CM | POA: Diagnosis not present

## 2022-09-17 DIAGNOSIS — M79675 Pain in left toe(s): Secondary | ICD-10-CM | POA: Diagnosis not present

## 2022-09-17 DIAGNOSIS — M79672 Pain in left foot: Secondary | ICD-10-CM | POA: Diagnosis not present

## 2022-09-17 DIAGNOSIS — M79671 Pain in right foot: Secondary | ICD-10-CM | POA: Diagnosis not present

## 2022-09-17 DIAGNOSIS — L110 Acquired keratosis follicularis: Secondary | ICD-10-CM | POA: Diagnosis not present

## 2022-09-24 ENCOUNTER — Other Ambulatory Visit: Payer: Self-pay

## 2022-09-24 DIAGNOSIS — M1711 Unilateral primary osteoarthritis, right knee: Secondary | ICD-10-CM

## 2022-09-24 DIAGNOSIS — Z96651 Presence of right artificial knee joint: Secondary | ICD-10-CM

## 2022-09-24 DIAGNOSIS — M1712 Unilateral primary osteoarthritis, left knee: Secondary | ICD-10-CM

## 2022-09-24 NOTE — Telephone Encounter (Signed)
Hydrocodone-Acetaminophen 5/325 MG  Qty 56 Tablets  Ibuprofen 800 MG  Qty 90 Tablets  PATIENT USES [facility] ON [address]

## 2022-09-25 MED ORDER — HYDROCODONE-ACETAMINOPHEN 5-325 MG PO TABS: 1.0000 | ORAL_TABLET | Freq: Four times a day (QID) | ORAL | 0 refills | Status: DC | PRN

## 2022-09-25 MED ORDER — IBUPROFEN 800 MG PO TABS: 800.0000 mg | ORAL_TABLET | Freq: Three times a day (TID) | ORAL | 1 refills | Status: DC | PRN

## 2022-10-08 ENCOUNTER — Other Ambulatory Visit: Payer: Self-pay | Admitting: Radiology

## 2022-10-08 DIAGNOSIS — M1712 Unilateral primary osteoarthritis, left knee: Secondary | ICD-10-CM

## 2022-10-08 DIAGNOSIS — Z96651 Presence of right artificial knee joint: Secondary | ICD-10-CM

## 2022-10-08 DIAGNOSIS — M1711 Unilateral primary osteoarthritis, right knee: Secondary | ICD-10-CM

## 2022-10-08 MED ORDER — HYDROCODONE-ACETAMINOPHEN 5-325 MG PO TABS: 1.0000 | ORAL_TABLET | Freq: Four times a day (QID) | ORAL | 0 refills | Status: DC | PRN

## 2022-10-08 NOTE — Telephone Encounter (Signed)
Requests refill hydrocodone, [facility] [address]

## 2022-10-08 NOTE — Telephone Encounter (Signed)
Sent to provider 

## 2022-10-11 ENCOUNTER — Other Ambulatory Visit: Payer: Self-pay

## 2022-10-11 ENCOUNTER — Other Ambulatory Visit (INDEPENDENT_AMBULATORY_CARE_PROVIDER_SITE_OTHER): Payer: 59

## 2022-10-11 ENCOUNTER — Ambulatory Visit (INDEPENDENT_AMBULATORY_CARE_PROVIDER_SITE_OTHER): Payer: 59 | Admitting: Orthopedic Surgery

## 2022-10-11 ENCOUNTER — Encounter: Payer: Self-pay | Admitting: Orthopedic Surgery

## 2022-10-11 VITALS — BP 131/86 | HR 79 | Ht 68.0 in | Wt 267.0 lb

## 2022-10-11 DIAGNOSIS — M79641 Pain in right hand: Secondary | ICD-10-CM

## 2022-10-11 DIAGNOSIS — M79642 Pain in left hand: Secondary | ICD-10-CM | POA: Diagnosis not present
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
## 2022-10-23 ENCOUNTER — Other Ambulatory Visit: Payer: Self-pay

## 2022-10-23 DIAGNOSIS — M79641 Pain in right hand: Secondary | ICD-10-CM | POA: Diagnosis not present

## 2022-10-23 DIAGNOSIS — M1712 Unilateral primary osteoarthritis, left knee: Secondary | ICD-10-CM

## 2022-10-23 DIAGNOSIS — M1711 Unilateral primary osteoarthritis, right knee: Secondary | ICD-10-CM

## 2022-10-23 DIAGNOSIS — Z96651 Presence of right artificial knee joint: Secondary | ICD-10-CM

## 2022-10-23 DIAGNOSIS — M79642 Pain in left hand: Secondary | ICD-10-CM | POA: Diagnosis not present

## 2022-10-23 NOTE — Telephone Encounter (Signed)
Hydrocodone-Acetaminophen 5/325 MG  Qty 56 Tablets  Take 1 tablet by mouth every 6 (six) hours as needed for moderate pain.   PATIENT USES [facility] ON [address]

## 2022-10-24 MED ORDER — HYDROCODONE-ACETAMINOPHEN 5-325 MG PO TABS: 1.0000 | ORAL_TABLET | Freq: Four times a day (QID) | ORAL | 0 refills | Status: DC | PRN

## 2022-11-07 DIAGNOSIS — G5603 Carpal tunnel syndrome, bilateral upper limbs: Secondary | ICD-10-CM | POA: Diagnosis not present

## 2022-11-12 ENCOUNTER — Other Ambulatory Visit: Payer: Self-pay

## 2022-11-12 DIAGNOSIS — M1712 Unilateral primary osteoarthritis, left knee: Secondary | ICD-10-CM

## 2022-11-12 DIAGNOSIS — Z96651 Presence of right artificial knee joint: Secondary | ICD-10-CM

## 2022-11-12 DIAGNOSIS — M1711 Unilateral primary osteoarthritis, right knee: Secondary | ICD-10-CM

## 2022-11-12 MED ORDER — IBUPROFEN 800 MG PO TABS: 800.0000 mg | ORAL_TABLET | Freq: Three times a day (TID) | ORAL | 1 refills | Status: DC | PRN

## 2022-11-12 MED ORDER — HYDROCODONE-ACETAMINOPHEN 5-325 MG PO TABS: 1.0000 | ORAL_TABLET | Freq: Four times a day (QID) | ORAL | 0 refills | Status: DC | PRN

## 2022-11-12 NOTE — Telephone Encounter (Signed)
Hydrocodone-Acetaminophen 5/325 MG  Qty 56 Tablets  Take 1 tablet by mouth every 6 (six) hours as needed for moderate pain.    Ibuprofen 800 MG Qty 90 Tablets  Take 1 tablet (800 mg total) by mouth every 8 (eight) hours as needed    PATIENT USES [facility] ON [address]     

## 2022-11-13 DIAGNOSIS — M79642 Pain in left hand: Secondary | ICD-10-CM | POA: Diagnosis not present

## 2022-11-13 DIAGNOSIS — M79641 Pain in right hand: Secondary | ICD-10-CM | POA: Diagnosis not present

## 2022-11-23 DIAGNOSIS — E7800 Pure hypercholesterolemia, unspecified: Secondary | ICD-10-CM | POA: Diagnosis not present

## 2022-11-23 DIAGNOSIS — I10 Essential (primary) hypertension: Secondary | ICD-10-CM | POA: Diagnosis not present

## 2022-11-23 DIAGNOSIS — E1169 Type 2 diabetes mellitus with other specified complication: Secondary | ICD-10-CM | POA: Diagnosis not present

## 2022-11-23 DIAGNOSIS — K219 Gastro-esophageal reflux disease without esophagitis: Secondary | ICD-10-CM | POA: Diagnosis not present

## 2022-11-23 DIAGNOSIS — E785 Hyperlipidemia, unspecified: Secondary | ICD-10-CM | POA: Diagnosis not present

## 2022-11-23 DIAGNOSIS — M130 Polyarthritis, unspecified: Secondary | ICD-10-CM | POA: Diagnosis not present

## 2022-11-23 DIAGNOSIS — E1165 Type 2 diabetes mellitus with hyperglycemia: Secondary | ICD-10-CM | POA: Diagnosis not present

## 2022-12-11 ENCOUNTER — Other Ambulatory Visit: Payer: Self-pay

## 2022-12-11 DIAGNOSIS — M1712 Unilateral primary osteoarthritis, left knee: Secondary | ICD-10-CM

## 2022-12-11 DIAGNOSIS — M1711 Unilateral primary osteoarthritis, right knee: Secondary | ICD-10-CM

## 2022-12-11 DIAGNOSIS — Z96651 Presence of right artificial knee joint: Secondary | ICD-10-CM

## 2022-12-11 MED ORDER — HYDROCODONE-ACETAMINOPHEN 5-325 MG PO TABS: 1.0000 | ORAL_TABLET | Freq: Four times a day (QID) | ORAL | 0 refills | Status: DC | PRN

## 2022-12-11 MED ORDER — IBUPROFEN 800 MG PO TABS: 800.0000 mg | ORAL_TABLET | Freq: Three times a day (TID) | ORAL | 1 refills | Status: DC | PRN

## 2022-12-11 NOTE — Telephone Encounter (Signed)
Dr. Romeo Apple patient--  Hydrocodone-Acetaminophen 5/325 MG   Qty 56 Tablets Take 1 tablet by mouth every 6 (six) hours as needed for moderate pain.   Ibuprofen 800 MG  Qty 90 Tablets Take 1 tablet (800 mg total) by mouth every 8 (eight) hours as needed.     PATIENT USES [facility] ON [address]

## 2022-12-17 DIAGNOSIS — M79674 Pain in right toe(s): Secondary | ICD-10-CM | POA: Diagnosis not present

## 2022-12-17 DIAGNOSIS — L110 Acquired keratosis follicularis: Secondary | ICD-10-CM | POA: Diagnosis not present

## 2022-12-17 DIAGNOSIS — E1140 Type 2 diabetes mellitus with diabetic neuropathy, unspecified: Secondary | ICD-10-CM | POA: Diagnosis not present

## 2022-12-17 DIAGNOSIS — M79675 Pain in left toe(s): Secondary | ICD-10-CM | POA: Diagnosis not present

## 2022-12-17 DIAGNOSIS — M79672 Pain in left foot: Secondary | ICD-10-CM | POA: Diagnosis not present

## 2022-12-17 DIAGNOSIS — M79671 Pain in right foot: Secondary | ICD-10-CM | POA: Diagnosis not present

## 2022-12-24 ENCOUNTER — Other Ambulatory Visit: Payer: Self-pay

## 2022-12-24 DIAGNOSIS — M1711 Unilateral primary osteoarthritis, right knee: Secondary | ICD-10-CM

## 2022-12-24 DIAGNOSIS — M1712 Unilateral primary osteoarthritis, left knee: Secondary | ICD-10-CM

## 2022-12-24 DIAGNOSIS — Z96651 Presence of right artificial knee joint: Secondary | ICD-10-CM

## 2022-12-24 MED ORDER — HYDROCODONE-ACETAMINOPHEN 5-325 MG PO TABS: 1.0000 | ORAL_TABLET | Freq: Four times a day (QID) | ORAL | 0 refills | Status: DC | PRN

## 2022-12-24 NOTE — Telephone Encounter (Signed)
Dr. Romeo Apple pt-----Hydrocodone-Acetaminophen 5/325  Qty 56 Tablets  Take 1 tablet by mouth every 6 (six) hours as needed for moderate pain.   PATIENT USES [facility] ON [address]

## 2023-01-07 ENCOUNTER — Other Ambulatory Visit: Payer: Self-pay | Admitting: Orthopedic Surgery

## 2023-01-07 DIAGNOSIS — M1711 Unilateral primary osteoarthritis, right knee: Secondary | ICD-10-CM

## 2023-01-07 DIAGNOSIS — M1712 Unilateral primary osteoarthritis, left knee: Secondary | ICD-10-CM

## 2023-01-07 DIAGNOSIS — Z96651 Presence of right artificial knee joint: Secondary | ICD-10-CM

## 2023-01-07 NOTE — Telephone Encounter (Signed)
Dr. Mort Sawyers pt - pt presented to the office requesting a refill on Hydrocodone 5-325, 56 quantity, take 1 tablet by mouth every 6 (six) hours as needed for moderate pain to be sent to [facility] on [address].

## 2023-01-08 ENCOUNTER — Other Ambulatory Visit: Payer: Self-pay

## 2023-01-08 DIAGNOSIS — M1711 Unilateral primary osteoarthritis, right knee: Secondary | ICD-10-CM

## 2023-01-08 DIAGNOSIS — Z96651 Presence of right artificial knee joint: Secondary | ICD-10-CM

## 2023-01-08 DIAGNOSIS — M1712 Unilateral primary osteoarthritis, left knee: Secondary | ICD-10-CM

## 2023-01-08 MED ORDER — HYDROCODONE-ACETAMINOPHEN 5-325 MG PO TABS: 1.0000 | ORAL_TABLET | Freq: Four times a day (QID) | ORAL | 0 refills | Status: DC | PRN

## 2023-01-08 NOTE — Telephone Encounter (Signed)
Hydrocodone-Acetaminophen 5/325 MG Qty 56 Tablets  Take 1 tablet by mouth every 6 (six) hours as needed for moderate pain.  PATIENT USES [facility] ON [address] 

## 2023-01-09 MED ORDER — HYDROCODONE-ACETAMINOPHEN 5-325 MG PO TABS: 1.0000 | ORAL_TABLET | Freq: Four times a day (QID) | ORAL | 0 refills | Status: DC | PRN

## 2023-01-23 ENCOUNTER — Other Ambulatory Visit: Payer: Self-pay

## 2023-01-23 DIAGNOSIS — M1711 Unilateral primary osteoarthritis, right knee: Secondary | ICD-10-CM

## 2023-01-23 DIAGNOSIS — M1712 Unilateral primary osteoarthritis, left knee: Secondary | ICD-10-CM

## 2023-01-23 DIAGNOSIS — Z96651 Presence of right artificial knee joint: Secondary | ICD-10-CM

## 2023-01-23 NOTE — Telephone Encounter (Signed)
Hydrocodone-Acetaminophen 5/325 MG Qty 56 Tablets        Take 1 tablet by mouth every 6 (six) hours as needed for moderate pain.   Ibuprofen 800 MG  Qty 90 Tablets   Take 1 tablet (800 mg total) by mouth every 8 (eight) hours as needed.       PATIENT USES [facility] ON [address] 

## 2023-01-23 NOTE — Telephone Encounter (Signed)
Dr Hilda Lias states you will renew his meds for patient since [facility] will not accept his prescriptions

## 2023-01-24 MED ORDER — HYDROCODONE-ACETAMINOPHEN 5-325 MG PO TABS: 1.0000 | ORAL_TABLET | Freq: Four times a day (QID) | ORAL | 0 refills | Status: DC | PRN

## 2023-01-24 NOTE — Addendum Note (Signed)
Addended by: Vickki Hearing on: 01/24/2023 08:13 AM   Modules accepted: Orders

## 2023-01-28 ENCOUNTER — Other Ambulatory Visit: Payer: Self-pay | Admitting: Orthopedic Surgery

## 2023-01-28 DIAGNOSIS — Z96651 Presence of right artificial knee joint: Secondary | ICD-10-CM

## 2023-01-28 DIAGNOSIS — M1712 Unilateral primary osteoarthritis, left knee: Secondary | ICD-10-CM

## 2023-01-28 DIAGNOSIS — M1711 Unilateral primary osteoarthritis, right knee: Secondary | ICD-10-CM

## 2023-01-28 MED ORDER — IBUPROFEN 800 MG PO TABS: 800.0000 mg | ORAL_TABLET | Freq: Three times a day (TID) | ORAL | 1 refills | Status: DC | PRN

## 2023-01-28 NOTE — Telephone Encounter (Signed)
Dr. Mort Sawyers pt - pt presented to the office requesting a refill on Ibuprofen 800mg , 90 quantity, Every 8 hours PRN to be sent to [facility] on [address]

## 2023-01-30 DIAGNOSIS — H25813 Combined forms of age-related cataract, bilateral: Secondary | ICD-10-CM | POA: Diagnosis not present

## 2023-01-30 DIAGNOSIS — H179 Unspecified corneal scar and opacity: Secondary | ICD-10-CM | POA: Diagnosis not present

## 2023-01-30 DIAGNOSIS — H401131 Primary open-angle glaucoma, bilateral, mild stage: Secondary | ICD-10-CM | POA: Diagnosis not present

## 2023-01-30 LAB — HM DIABETES EYE EXAM

## 2023-02-12 ENCOUNTER — Other Ambulatory Visit: Payer: Self-pay | Admitting: Orthopedic Surgery

## 2023-02-12 DIAGNOSIS — Z96651 Presence of right artificial knee joint: Secondary | ICD-10-CM

## 2023-02-12 DIAGNOSIS — M1712 Unilateral primary osteoarthritis, left knee: Secondary | ICD-10-CM

## 2023-02-12 DIAGNOSIS — M1711 Unilateral primary osteoarthritis, right knee: Secondary | ICD-10-CM

## 2023-02-12 MED ORDER — HYDROCODONE-ACETAMINOPHEN 5-325 MG PO TABS: 1.0000 | ORAL_TABLET | Freq: Four times a day (QID) | ORAL | 0 refills | Status: DC | PRN

## 2023-02-12 NOTE — Telephone Encounter (Signed)
Dr. Mort Sawyers pt - pt presented to the office requesting a refill on his Hydrocodone 5-325, 56 tablets, every 6 hours PRN for moderate pain to be sent to [facility].

## 2023-02-18 ENCOUNTER — Ambulatory Visit (INDEPENDENT_AMBULATORY_CARE_PROVIDER_SITE_OTHER): Payer: 59 | Admitting: Orthopedic Surgery

## 2023-02-18 ENCOUNTER — Encounter: Payer: Self-pay | Admitting: Orthopedic Surgery

## 2023-02-18 VITALS — BP 118/76 | HR 90 | Ht 69.0 in | Wt 282.0 lb

## 2023-02-18 DIAGNOSIS — Z96651 Presence of right artificial knee joint: Secondary | ICD-10-CM | POA: Diagnosis not present

## 2023-02-18 DIAGNOSIS — M79641 Pain in right hand: Secondary | ICD-10-CM | POA: Diagnosis not present

## 2023-02-18 DIAGNOSIS — M79642 Pain in left hand: Secondary | ICD-10-CM

## 2023-02-18 DIAGNOSIS — M1712 Unilateral primary osteoarthritis, left knee: Secondary | ICD-10-CM | POA: Diagnosis not present

## 2023-02-18 NOTE — Patient Instructions (Signed)
Capzacin

## 2023-02-18 NOTE — Progress Notes (Signed)
Chief Complaint  Patient presents with   Knee Pain    LEFT   Encounter Diagnoses  Name Primary?   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  He put the brace on his knee and now his knee does not hurt  He has a benign exam for acute process.  We know he has a flexion contracture in the left knee limited flexion 205 degrees there is no instability no effusion he is neurovascularly intact he is walking without a limp  He asked about some interventions we could do for his hands and I told him to use Capzasin  Follow-up as needed

## 2023-02-25 DIAGNOSIS — K769 Liver disease, unspecified: Secondary | ICD-10-CM | POA: Diagnosis not present

## 2023-02-25 DIAGNOSIS — E782 Mixed hyperlipidemia: Secondary | ICD-10-CM | POA: Diagnosis not present

## 2023-02-25 DIAGNOSIS — E1165 Type 2 diabetes mellitus with hyperglycemia: Secondary | ICD-10-CM | POA: Diagnosis not present

## 2023-02-25 DIAGNOSIS — L239 Allergic contact dermatitis, unspecified cause: Secondary | ICD-10-CM | POA: Diagnosis not present

## 2023-02-25 DIAGNOSIS — I10 Essential (primary) hypertension: Secondary | ICD-10-CM | POA: Diagnosis not present

## 2023-03-05 ENCOUNTER — Other Ambulatory Visit: Payer: Self-pay | Admitting: Orthopedic Surgery

## 2023-03-05 DIAGNOSIS — M1711 Unilateral primary osteoarthritis, right knee: Secondary | ICD-10-CM

## 2023-03-05 DIAGNOSIS — Z96651 Presence of right artificial knee joint: Secondary | ICD-10-CM

## 2023-03-05 DIAGNOSIS — M1712 Unilateral primary osteoarthritis, left knee: Secondary | ICD-10-CM

## 2023-03-05 MED ORDER — HYDROCODONE-ACETAMINOPHEN 5-325 MG PO TABS: 1.0000 | ORAL_TABLET | Freq: Four times a day (QID) | ORAL | 0 refills | Status: DC | PRN

## 2023-03-05 NOTE — Telephone Encounter (Signed)
Dr. Mort Sawyers pt - pt lvm requesting a refill on Hydrocodone 5-325, 56 tablets, Every 6 hours PRN for moderate pain to be sent to [facility].

## 2023-03-19 ENCOUNTER — Other Ambulatory Visit: Payer: Self-pay | Admitting: Orthopedic Surgery

## 2023-03-19 DIAGNOSIS — Z96651 Presence of right artificial knee joint: Secondary | ICD-10-CM

## 2023-03-19 DIAGNOSIS — M1712 Unilateral primary osteoarthritis, left knee: Secondary | ICD-10-CM

## 2023-03-19 DIAGNOSIS — M1711 Unilateral primary osteoarthritis, right knee: Secondary | ICD-10-CM

## 2023-03-19 MED ORDER — HYDROCODONE-ACETAMINOPHEN 5-325 MG PO TABS: 1.0000 | ORAL_TABLET | Freq: Four times a day (QID) | ORAL | 0 refills | Status: DC | PRN

## 2023-03-19 NOTE — Telephone Encounter (Signed)
Dr. Mort Sawyers pt - spoke w/the patient, he is requesting a refill on Hydrocodone 5-325, 56 tablets, Every 6 hours PRN for moderate pain to be sent to [facility] on [address]

## 2023-04-09 DIAGNOSIS — M79674 Pain in right toe(s): Secondary | ICD-10-CM | POA: Diagnosis not present

## 2023-04-09 DIAGNOSIS — M79675 Pain in left toe(s): Secondary | ICD-10-CM | POA: Diagnosis not present

## 2023-04-09 DIAGNOSIS — E1140 Type 2 diabetes mellitus with diabetic neuropathy, unspecified: Secondary | ICD-10-CM | POA: Diagnosis not present

## 2023-04-09 DIAGNOSIS — L110 Acquired keratosis follicularis: Secondary | ICD-10-CM | POA: Diagnosis not present

## 2023-04-09 DIAGNOSIS — M79671 Pain in right foot: Secondary | ICD-10-CM | POA: Diagnosis not present

## 2023-04-09 DIAGNOSIS — M79672 Pain in left foot: Secondary | ICD-10-CM | POA: Diagnosis not present

## 2023-04-12 ENCOUNTER — Telehealth: Payer: Self-pay | Admitting: Internal Medicine

## 2023-04-12 NOTE — Telephone Encounter (Signed)
Needs new patient approval

## 2023-04-16 NOTE — Telephone Encounter (Signed)
Scheduled, patient understood

## 2023-04-24 ENCOUNTER — Other Ambulatory Visit: Payer: Self-pay | Admitting: Orthopedic Surgery

## 2023-04-24 DIAGNOSIS — M1711 Unilateral primary osteoarthritis, right knee: Secondary | ICD-10-CM

## 2023-04-24 DIAGNOSIS — M1712 Unilateral primary osteoarthritis, left knee: Secondary | ICD-10-CM

## 2023-04-24 DIAGNOSIS — Z96651 Presence of right artificial knee joint: Secondary | ICD-10-CM

## 2023-04-24 MED ORDER — HYDROCODONE-ACETAMINOPHEN 5-325 MG PO TABS: 1.0000 | ORAL_TABLET | Freq: Four times a day (QID) | ORAL | 0 refills | Status: DC | PRN

## 2023-04-24 NOTE — Telephone Encounter (Signed)
done

## 2023-04-24 NOTE — Telephone Encounter (Signed)
Dr. Mort Sawyers pt - spoke w/the patient, he is requesting a refill on Hydrocodone 5-325, 56 quantity, every 6 hours PRN for moderate pain (pain score 4-6) and Ibuprofen 800mg , 90 tablets, every 8 hours PRN to be sent to [facility] on [address]

## 2023-05-06 ENCOUNTER — Other Ambulatory Visit: Payer: Self-pay | Admitting: Orthopedic Surgery

## 2023-05-06 ENCOUNTER — Ambulatory Visit: Payer: 59 | Admitting: Orthopedic Surgery

## 2023-05-06 DIAGNOSIS — Z96651 Presence of right artificial knee joint: Secondary | ICD-10-CM

## 2023-05-06 DIAGNOSIS — M1712 Unilateral primary osteoarthritis, left knee: Secondary | ICD-10-CM

## 2023-05-06 DIAGNOSIS — M1711 Unilateral primary osteoarthritis, right knee: Secondary | ICD-10-CM

## 2023-05-06 MED ORDER — HYDROCODONE-ACETAMINOPHEN 5-325 MG PO TABS: 1.0000 | ORAL_TABLET | Freq: Four times a day (QID) | ORAL | 0 refills | Status: DC | PRN

## 2023-05-06 NOTE — Telephone Encounter (Signed)
Dr. Mort Sawyers pt - spoke w/the patient, he is requesting a refill for Hydrocodone 5-325, 56 tablets, every 6 hours PRN for moderate pain (pain score 4-6) to be sent to [facility] on [address].

## 2023-05-22 ENCOUNTER — Ambulatory Visit: Payer: 59 | Admitting: Internal Medicine

## 2023-05-22 ENCOUNTER — Encounter: Payer: Self-pay | Admitting: Internal Medicine

## 2023-05-22 VITALS — BP 129/83 | HR 78 | Ht 68.0 in | Wt 282.0 lb

## 2023-05-22 DIAGNOSIS — I10 Essential (primary) hypertension: Secondary | ICD-10-CM | POA: Diagnosis not present

## 2023-05-22 DIAGNOSIS — L309 Dermatitis, unspecified: Secondary | ICD-10-CM | POA: Diagnosis not present

## 2023-05-22 DIAGNOSIS — Z114 Encounter for screening for human immunodeficiency virus [HIV]: Secondary | ICD-10-CM

## 2023-05-22 DIAGNOSIS — Z122 Encounter for screening for malignant neoplasm of respiratory organs: Secondary | ICD-10-CM

## 2023-05-22 DIAGNOSIS — M1712 Unilateral primary osteoarthritis, left knee: Secondary | ICD-10-CM | POA: Diagnosis not present

## 2023-05-22 DIAGNOSIS — Z7984 Long term (current) use of oral hypoglycemic drugs: Secondary | ICD-10-CM

## 2023-05-22 DIAGNOSIS — E1169 Type 2 diabetes mellitus with other specified complication: Secondary | ICD-10-CM | POA: Diagnosis not present

## 2023-05-22 DIAGNOSIS — K219 Gastro-esophageal reflux disease without esophagitis: Secondary | ICD-10-CM

## 2023-05-22 DIAGNOSIS — J449 Chronic obstructive pulmonary disease, unspecified: Secondary | ICD-10-CM | POA: Diagnosis not present

## 2023-05-22 DIAGNOSIS — E782 Mixed hyperlipidemia: Secondary | ICD-10-CM | POA: Diagnosis not present

## 2023-05-22 DIAGNOSIS — Z1159 Encounter for screening for other viral diseases: Secondary | ICD-10-CM

## 2023-05-22 DIAGNOSIS — Z23 Encounter for immunization: Secondary | ICD-10-CM

## 2023-05-22 DIAGNOSIS — E119 Type 2 diabetes mellitus without complications: Secondary | ICD-10-CM | POA: Insufficient documentation

## 2023-05-22 MED ORDER — TRIAMCINOLONE ACETONIDE 0.1 % EX CREA: TOPICAL_CREAM | Freq: Two times a day (BID) | CUTANEOUS | 1 refills | Status: DC

## 2023-05-22 MED ORDER — HALOBETASOL PROPIONATE 0.05 % EX CREA: 1.0000 "application " | TOPICAL_CREAM | Freq: Two times a day (BID) | CUTANEOUS | 2 refills | Status: DC | PRN

## 2023-05-22 MED ORDER — METFORMIN HCL 1000 MG PO TABS: 1000.0000 mg | ORAL_TABLET | Freq: Every day | ORAL | 1 refills | Status: DC

## 2023-05-22 MED ORDER — TELMISARTAN-HCTZ 40-12.5 MG PO TABS: 1.0000 | ORAL_TABLET | Freq: Every morning | ORAL | 1 refills | Status: DC

## 2023-05-22 MED ORDER — RYBELSUS 14 MG PO TABS: 1.0000 | ORAL_TABLET | Freq: Every morning | ORAL | 5 refills | Status: DC

## 2023-05-22 MED ORDER — ALBUTEROL SULFATE HFA 108 (90 BASE) MCG/ACT IN AERS: 2.0000 | INHALATION_SPRAY | Freq: Four times a day (QID) | RESPIRATORY_TRACT | 3 refills | Status: DC | PRN

## 2023-05-22 MED ORDER — ATORVASTATIN CALCIUM 20 MG PO TABS: 20.0000 mg | ORAL_TABLET | Freq: Every day | ORAL | 1 refills | Status: DC
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
## 2023-05-22 NOTE — Patient Instructions (Signed)
Please continue to take medications as prescribed. ? ?Please continue to follow low carb diet and perform moderate exercise/walking at least 150 mins/week. ?

## 2023-05-23 DIAGNOSIS — E782 Mixed hyperlipidemia: Secondary | ICD-10-CM | POA: Insufficient documentation

## 2023-05-23 DIAGNOSIS — Z122 Encounter for screening for malignant neoplasm of respiratory organs: Secondary | ICD-10-CM | POA: Insufficient documentation

## 2023-05-23 DIAGNOSIS — L309 Dermatitis, unspecified: Secondary | ICD-10-CM | POA: Insufficient documentation

## 2023-05-23 NOTE — Assessment & Plan Note (Signed)
On atorvastatin Check lipid profile

## 2023-05-23 NOTE — Assessment & Plan Note (Addendum)
On Norco as needed for severe pain Followed by orthopedic surgery

## 2023-05-23 NOTE — Assessment & Plan Note (Signed)
Reports history of eczema Uses Ultravate over trunk and Kenalog over forehead area - refilled

## 2023-05-23 NOTE — Assessment & Plan Note (Signed)
Quit smoking in 2013, has > 20-pack-year smoking history Ordered low-dose CT chest after discussing with the patient. 

## 2023-05-23 NOTE — Assessment & Plan Note (Signed)
Well controlled with albuterol as needed Has had  pulmonology evaluation

## 2023-05-23 NOTE — Assessment & Plan Note (Signed)
BP Readings from Last 1 Encounters:  05/22/23 129/83   Well-controlled with Telmisartan-hydrochlorothiazide 40-12.5 mg QD Counseled for compliance with the medications Advised DASH diet and moderate exercise/walking, at least 150 mins/week

## 2023-05-23 NOTE — Assessment & Plan Note (Addendum)
Lab Results  Component Value Date   HGBA1C 6.4 (H) 05/22/2023   Well-controlled Associated with HTN and HLD On metformin 1000 mg QD and Rybelsus 14 mg QD Advised to follow diabetic diet On statin and ARB F/u CMP and lipid panel Diabetic eye exam: Advised to follow up with Ophthalmology for diabetic eye exam

## 2023-05-23 NOTE — Assessment & Plan Note (Signed)
BMI Readings from Last 3 Encounters:  05/22/23 42.88 kg/m  02/18/23 41.64 kg/m  10/11/22 40.60 kg/m   Advised to follow low-carb diet and perform moderate exercise/walking as tolerated On GLP-1 agonist for type II DM

## 2023-05-24 LAB — MICROALBUMIN / CREATININE URINE RATIO
Creatinine, Urine: 149.2 mg/dL
Microalb/Creat Ratio: 3 mg/g{creat} (ref 0–29)
Microalbumin, Urine: 5.1 ug/mL

## 2023-05-24 LAB — LIPID PANEL
Chol/HDL Ratio: 3.2 ratio (ref 0.0–5.0)
Cholesterol, Total: 102 mg/dL (ref 100–199)
HDL: 32 mg/dL — ABNORMAL LOW (ref >39)
LDL Chol Calc (NIH): 54 mg/dL (ref 0–99)
Triglycerides: 74 mg/dL (ref 0–149)
VLDL Cholesterol Cal: 16 mg/dL (ref 5–40)

## 2023-05-24 LAB — CMP14+EGFR
ALT: 35 [IU]/L (ref 0–44)
AST: 26 [IU]/L (ref 0–40)
Albumin: 4.4 g/dL (ref 3.8–4.9)
Alkaline Phosphatase: 93 [IU]/L (ref 44–121)
BUN/Creatinine Ratio: 17 (ref 9–20)
BUN: 18 mg/dL (ref 6–24)
Bilirubin Total: 0.3 mg/dL (ref 0.0–1.2)
CO2: 23 mmol/L (ref 20–29)
Calcium: 9.6 mg/dL (ref 8.7–10.2)
Chloride: 105 mmol/L (ref 96–106)
Creatinine, Ser: 1.07 mg/dL (ref 0.76–1.27)
Globulin, Total: 2.7 g/dL (ref 1.5–4.5)
Glucose: 79 mg/dL (ref 70–99)
Potassium: 4.6 mmol/L (ref 3.5–5.2)
Sodium: 140 mmol/L (ref 134–144)
Total Protein: 7.1 g/dL (ref 6.0–8.5)
eGFR: 80 mL/min/{1.73_m2} (ref >59)

## 2023-05-24 LAB — CBC
Hematocrit: 39.7 % (ref 37.5–51.0)
Hemoglobin: 12.8 g/dL — ABNORMAL LOW (ref 13.0–17.7)
MCH: 27.2 pg (ref 26.6–33.0)
MCHC: 32.2 g/dL (ref 31.5–35.7)
MCV: 84 fL (ref 79–97)
Platelets: 269 10*3/uL (ref 150–450)
RBC: 4.71 x10E6/uL (ref 4.14–5.80)
RDW: 13.6 % (ref 11.6–15.4)
WBC: 3.7 10*3/uL (ref 3.4–10.8)

## 2023-05-24 LAB — HIV ANTIBODY (ROUTINE TESTING W REFLEX): HIV Screen 4th Generation wRfx: NONREACTIVE

## 2023-05-24 LAB — HEPATITIS C ANTIBODY: Hep C Virus Ab: NONREACTIVE

## 2023-05-24 LAB — HEMOGLOBIN A1C
Est. average glucose Bld gHb Est-mCnc: 137 mg/dL
Hgb A1c MFr Bld: 6.4 % — ABNORMAL HIGH (ref 4.8–5.6)

## 2023-05-28 ENCOUNTER — Other Ambulatory Visit: Payer: Self-pay | Admitting: Orthopedic Surgery

## 2023-05-28 DIAGNOSIS — M1712 Unilateral primary osteoarthritis, left knee: Secondary | ICD-10-CM

## 2023-05-28 DIAGNOSIS — Z96651 Presence of right artificial knee joint: Secondary | ICD-10-CM

## 2023-05-28 DIAGNOSIS — M1711 Unilateral primary osteoarthritis, right knee: Secondary | ICD-10-CM

## 2023-05-28 NOTE — Telephone Encounter (Signed)
Dr. Mort Sawyers pt - pt lvm requesting a refill on Hydrocodone 5-325, 56 tablets, Every 6 hours PRN for moderate pain (pain score 4-6) to be sent to [facility] on [address].  [phone]

## 2023-05-29 ENCOUNTER — Ambulatory Visit (HOSPITAL_COMMUNITY)
Admission: RE | Admit: 2023-05-29 | Discharge: 2023-05-29 | Disposition: A | Discharge status: Home / Self Care | Payer: 59 | Source: Ambulatory Visit | Attending: Internal Medicine | Admitting: Internal Medicine

## 2023-05-29 DIAGNOSIS — I700 Atherosclerosis of aorta: Secondary | ICD-10-CM | POA: Insufficient documentation

## 2023-05-29 DIAGNOSIS — Z122 Encounter for screening for malignant neoplasm of respiratory organs: Secondary | ICD-10-CM | POA: Insufficient documentation

## 2023-05-29 DIAGNOSIS — J439 Emphysema, unspecified: Secondary | ICD-10-CM | POA: Insufficient documentation

## 2023-05-29 DIAGNOSIS — Z87891 Personal history of nicotine dependence: Secondary | ICD-10-CM | POA: Diagnosis not present

## 2023-05-29 MED ORDER — HYDROCODONE-ACETAMINOPHEN 5-325 MG PO TABS: 1.0000 | ORAL_TABLET | Freq: Four times a day (QID) | ORAL | 0 refills | Status: DC | PRN

## 2023-06-10 ENCOUNTER — Other Ambulatory Visit: Payer: Self-pay

## 2023-06-10 DIAGNOSIS — M1711 Unilateral primary osteoarthritis, right knee: Secondary | ICD-10-CM

## 2023-06-10 DIAGNOSIS — Z96651 Presence of right artificial knee joint: Secondary | ICD-10-CM

## 2023-06-10 DIAGNOSIS — M1712 Unilateral primary osteoarthritis, left knee: Secondary | ICD-10-CM

## 2023-06-10 MED ORDER — HYDROCODONE-ACETAMINOPHEN 5-325 MG PO TABS: 1.0000 | ORAL_TABLET | Freq: Four times a day (QID) | ORAL | 0 refills | Status: DC | PRN

## 2023-06-10 NOTE — Telephone Encounter (Signed)
Hydrocodone-Acetaminophen 5/325 MG Qty 56 Tablets  Take 1 tablet by mouth every 6 (six) hours as needed for moderate pain (pain score 4-6).   Ibuprofen 800 MG  Qty 90 Tablets  Take 1 tablet (800 mg total) by mouth every 8 (eight) hours as needed.    PATIENT USES [facility] ON [address]   Due Wednesday per patient

## 2023-06-13 ENCOUNTER — Emergency Department (HOSPITAL_COMMUNITY): Payer: 59

## 2023-06-13 ENCOUNTER — Emergency Department (HOSPITAL_COMMUNITY)
Admission: EM | Admit: 2023-06-13 | Discharge: 2023-06-13 | Disposition: A | Discharge status: Home / Self Care | Payer: 59 | Attending: Emergency Medicine | Admitting: Emergency Medicine

## 2023-06-13 ENCOUNTER — Encounter (HOSPITAL_COMMUNITY): Payer: Self-pay

## 2023-06-13 ENCOUNTER — Other Ambulatory Visit: Payer: Self-pay

## 2023-06-13 DIAGNOSIS — Z79899 Other long term (current) drug therapy: Secondary | ICD-10-CM | POA: Insufficient documentation

## 2023-06-13 DIAGNOSIS — Z7982 Long term (current) use of aspirin: Secondary | ICD-10-CM | POA: Diagnosis not present

## 2023-06-13 DIAGNOSIS — K5730 Diverticulosis of large intestine without perforation or abscess without bleeding: Secondary | ICD-10-CM | POA: Diagnosis not present

## 2023-06-13 DIAGNOSIS — K429 Umbilical hernia without obstruction or gangrene: Secondary | ICD-10-CM | POA: Diagnosis not present

## 2023-06-13 DIAGNOSIS — E119 Type 2 diabetes mellitus without complications: Secondary | ICD-10-CM | POA: Insufficient documentation

## 2023-06-13 DIAGNOSIS — R160 Hepatomegaly, not elsewhere classified: Secondary | ICD-10-CM | POA: Diagnosis not present

## 2023-06-13 DIAGNOSIS — Z7984 Long term (current) use of oral hypoglycemic drugs: Secondary | ICD-10-CM | POA: Insufficient documentation

## 2023-06-13 DIAGNOSIS — I10 Essential (primary) hypertension: Secondary | ICD-10-CM | POA: Insufficient documentation

## 2023-06-13 DIAGNOSIS — J449 Chronic obstructive pulmonary disease, unspecified: Secondary | ICD-10-CM | POA: Diagnosis not present

## 2023-06-13 DIAGNOSIS — M545 Low back pain, unspecified: Secondary | ICD-10-CM | POA: Diagnosis not present

## 2023-06-13 MED ORDER — TIZANIDINE HCL 4 MG PO TABS: 4.0000 mg | ORAL_TABLET | Freq: Four times a day (QID) | ORAL | 0 refills | Status: AC | PRN

## 2023-06-13 MED ORDER — CYCLOBENZAPRINE HCL 10 MG PO TABS
10.0000 mg | ORAL_TABLET | Freq: Once | ORAL | Status: AC
Start: 2023-06-13 — End: 2023-06-13
  Administered 2023-06-13: 10 mg via ORAL
  Filled 2023-06-13: qty 1

## 2023-06-13 MED ORDER — ACETAMINOPHEN 325 MG PO TABS
650.0000 mg | ORAL_TABLET | Freq: Once | ORAL | Status: AC
Start: 2023-06-13 — End: 2023-06-13
  Administered 2023-06-13: 650 mg via ORAL
  Filled 2023-06-13: qty 2

## 2023-06-13 NOTE — ED Provider Notes (Signed)
[place] EMERGENCY DEPARTMENT AT [facility] Provider Note   CSN: [id number] Arrival date & time: 06/13/23  [id number]     History  Chief complaint, back pain  Terry Case is a 57 y.o. male.  The history is provided by the patient.  He has history of hypertension, diabetes, hyperlipidemia, COPD and comes in complaining of pain in his right lower back which started this afternoon.  He has taken ibuprofen which has given slight relief.  There is no radiation of pain.  He denies any weakness or numbness.  Denies any bowel or bladder dysfunction.  Denies fever or chills.  He thinks he had similar pain once before several years ago, but in a different location.  He denies any unusual bending, lifting, twisting.   Home Medications Prior to Admission medications   Medication Sig Start Date End Date Taking? Authorizing Provider  albuterol (VENTOLIN HFA) 108 (90 Base) MCG/ACT inhaler Inhale 2 puffs into the lungs every 6 (six) hours as needed for wheezing or shortness of breath. 05/22/23   Anabel Halon, MD  aspirin EC 81 MG tablet Take 81 mg by mouth daily. Swallow whole.    [provider]  atorvastatin (LIPITOR) 20 MG tablet Take 1 tablet (20 mg total) by mouth at bedtime. 05/22/23   Anabel Halon, MD  diphenhydrAMINE (BENADRYL) 25 MG tablet Take 50 mg by mouth in the morning.    [provider]  dorzolamide (TRUSOPT) 2 % ophthalmic solution Place 1 drop into both eyes 2 (two) times daily.    [provider]  halobetasol (ULTRAVATE) 0.05 % cream Apply 1 application  topically 2 (two) times daily as needed (eczema). 05/22/23   Anabel Halon, MD  HYDROcodone-acetaminophen (NORCO/VICODIN) 5-325 MG tablet Take 1 tablet by mouth every 6 (six) hours as needed for moderate pain (pain score 4-6). 06/10/23   Vickki Hearing, MD  ibuprofen (ADVIL) 800 MG tablet Take 1 tablet (800 mg total) by mouth every 8 (eight) hours as needed. 01/28/23   Vickki Hearing, MD  metFORMIN (GLUCOPHAGE) 1000 MG tablet Take 1 tablet (1,000 mg total) by mouth daily. 05/22/23   Anabel Halon, MD  methocarbamol (ROBAXIN) 500 MG tablet Take 1 tablet (500 mg total) by mouth every 6 (six) hours as needed for muscle spasms. Patient not taking: Reported on 05/22/2023 03/22/21   Vickki Hearing, MD  Misc. Devices (3-IN-1 BEDSIDE TOILET) MISC Use after knee replacement M17.0 osteoarthritis knee length of need 53mo 03/22/21   Vickki Hearing, MD  naloxone [facility]) nasal spray 4 mg/0.1 mL SMARTSIG:Both Nares 03/22/21   [provider]  Netarsudil-Latanoprost (ROCKLATAN) 0.02-0.005 % SOLN Place 1 drop into both eyes at bedtime.    [provider]  RYBELSUS 14 MG TABS Take 1 tablet (14 mg total) by mouth every morning. 05/22/23   Anabel Halon, MD  telmisartan-hydrochlorothiazide (MICARDIS HCT) 40-12.5 MG tablet Take 1 tablet by mouth every morning. 05/22/23   Anabel Halon, MD  triamcinolone cream (KENALOG) 0.1 % Apply topically 2 (two) times daily. 05/22/23   Anabel Halon, MD      Allergies    Fish allergy, Tomato, and Vicodin [hydrocodone-acetaminophen]    Review of Systems   Review of Systems  All other systems reviewed and are negative.   Physical Exam Updated Vital Signs BP (!) 106/58   Pulse 73   Temp 97.9 F (36.6 C)   Resp 18   SpO2 96%  Physical Exam Vitals and nursing note reviewed.   57 year old male, resting comfortably and in no acute distress. Vital signs are normal. Oxygen saturation is 93%, which is normal. Head is normocephalic and atraumatic. PERRLA, EOMI.  Back is nontender.  There is mild to moderate right paralumbar spasm, none on the left.  Straight leg raise is negative. Lungs are clear without rales, wheezes, or rhonchi. Chest is nontender. Heart has regular rate and rhythm without murmur. Abdomen is soft, flat, nontender. Extremities have no cyanosis or edema, full range of motion is present. Skin  is warm and dry without rash. Neurologic: Mental status is normal, moves all extremities equally.  ED Results / Procedures / Treatments    Radiology CT Renal Stone Study Result Date: 06/13/2023 CLINICAL DATA:  Abdominal/flank pain with stone suspected. Left-sided back pain that started yesterday afternoon EXAM: CT ABDOMEN AND PELVIS WITHOUT CONTRAST TECHNIQUE: Multidetector CT imaging of the abdomen and pelvis was performed following the standard protocol without IV contrast. RADIATION DOSE REDUCTION: This exam was performed according to the departmental dose-optimization program which includes automated exposure control, adjustment of the mA and/or kV according to patient size and/or use of iterative reconstruction technique. COMPARISON:  None Available. FINDINGS: Lower chest:  No contributory findings. Hepatobiliary: Mass in the left lobe liver measuring 3.2 cm, in retrospect stable since 2021 noncontrast chest CT, a benign process. There is an additional soft tissue density nodule in the inferior right lobe measuring 15 mm, not covered on prior chest CT or described in 2006 abdominal CT report. No evidence of biliary obstruction or stone. Pancreas: Unremarkable. Spleen: Unremarkable. Adrenals/Urinary Tract: Mass with calcification and fat density measuring 3.5 cm, consistent with myelolipoma. No change from recent prior to suggest acute hemorrhage is cause of symptoms. No hydronephrosis or stone. 5.5 cm left renal cyst with simple CT appearance and no specific follow-up imaging recommended. Unremarkable bladder. Stomach/Bowel: No obstruction. No appendicitis. Scattered colonic diverticula, overall mild with borderline fat stranding around a sigmoid diverticulum. Vascular/Lymphatic: No acute vascular abnormality. Atheromatous calcification of the aorta and iliacs. No mass or adenopathy. Reproductive:No acute findings. Other: No ascites or pneumoperitoneum. Small fatty umbilical hernia. Musculoskeletal: No  acute abnormalities. Simple lipoma interposed between the anterior and middle compartments of the right thigh with simple internal architecture and 5.6 cm span on axial slices. Advanced lower thoracic and lumbar degeneration with multilevel foraminal stenosis. Advanced L2-3 spinal stenosis. IMPRESSION: 1. No hydronephrosis or stone. 2. Mild colonic diverticulosis with borderline fat stranding around a sigmoid diverticulum, could symptoms correlate with diverticulitis? 3. Two liver masses, the larger in the left lobe measuring 3.5 cm. This larger lesion is benign given stability from a 2021 chest CT but the more inferior 1.5 cm lesion was not covered on prior imaging. Recommend non emergent liver MRI with contrast. 4. Atherosclerosis, advanced spinal degeneration, left adrenal myelolipoma, and fatty umbilical hernia. Electronically Signed   By: Tiburcio Pea M.D.   On: 06/13/2023 04:45    Procedures Procedures    Medications Ordered in ED Medications  acetaminophen (TYLENOL) tablet 650 mg (650 mg Oral Given 06/13/23 0402)  cyclobenzaprine (FLEXERIL) tablet 10 mg (10 mg Oral Given 06/13/23 0403)    ED Course/ Medical Decision Making/ A&P                                 Medical Decision Making Amount and/or Complexity of Data Reviewed Labs: ordered. Radiology: ordered.  Risk OTC drugs. Prescription drug management.   Right sided low back pain, most likely musculoskeletal.  No evidence of radiculopathy, consider pyelonephritis or kidney stone.  I reviewed his past records, and he had lumbar spine x-rays on on 07/04/2016 which showed multilevel osteophytes and moderate spondylosis.  He took a dose of ibuprofen shortly before coming to the ED, I have added a dose of acetaminophen and cyclobenzaprine.  I have ordered a renal stone protocol CT scan.  CT scan shows no evidence of hydronephrosis, but incidental finding of 2 liver masses, 1 of which had not been seen on prior chest CT because it is  outside of where the scan would have reached.  Because of that, it is necessary to have a follow-up MRI of the liver with contrast.  Patient has ibuprofen and hydrocodone-acetaminophen at home and he is to continue taking these.  I am adding a prescription for tizanidine.  I have recommended he apply ice, follow-up with PCP.  Final Clinical Impression(s) / ED Diagnoses Final diagnoses:  Acute right-sided low back pain without sciatica  Liver mass    Rx / DC Orders ED Discharge Orders          Ordered    tiZANidine (ZANAFLEX) 4 MG tablet  Every 6 hours PRN        06/13/23 0502              Dione Booze, MD 06/13/23 0505

## 2023-06-13 NOTE — ED Triage Notes (Signed)
Pt c/o left sided back pain that started yesterday afternoon.

## 2023-06-13 NOTE — Discharge Instructions (Addendum)
Apply ice for 30 minutes at a time, 4 times a day.  You may continue taking ibuprofen 800 mg as often as 3 times a day.  Do not take it more than 3 times a day because it can irritate your stomach and cause bleeding.  You may add acetaminophen as needed.  If you are still not getting sufficient pain relief, you may take your hydrocodone-acetaminophen.  Just be careful not to take more than 4000 mg of acetaminophen in any 1 day.  Your CT scan did not show any signs of a kidney stone, but did show 2 masses in the liver.  One of them had not been seen on your prior CT scans.  Radiologist is recommending that you obtain an MRI of your liver with contrast.  This can be scheduled by your primary care provider.

## 2023-06-21 ENCOUNTER — Ambulatory Visit (INDEPENDENT_AMBULATORY_CARE_PROVIDER_SITE_OTHER): Payer: 59 | Admitting: Internal Medicine

## 2023-06-21 ENCOUNTER — Encounter: Payer: Self-pay | Admitting: Internal Medicine

## 2023-06-21 VITALS — BP 122/82 | HR 78 | Ht 68.0 in | Wt 276.8 lb

## 2023-06-21 DIAGNOSIS — R350 Frequency of micturition: Secondary | ICD-10-CM | POA: Insufficient documentation

## 2023-06-21 DIAGNOSIS — K7689 Other specified diseases of liver: Secondary | ICD-10-CM | POA: Insufficient documentation

## 2023-06-21 DIAGNOSIS — M1712 Unilateral primary osteoarthritis, left knee: Secondary | ICD-10-CM | POA: Diagnosis not present

## 2023-06-21 DIAGNOSIS — N401 Enlarged prostate with lower urinary tract symptoms: Secondary | ICD-10-CM | POA: Insufficient documentation

## 2023-06-21 DIAGNOSIS — I10 Essential (primary) hypertension: Secondary | ICD-10-CM

## 2023-06-21 DIAGNOSIS — Z09 Encounter for follow-up examination after completed treatment for conditions other than malignant neoplasm: Secondary | ICD-10-CM | POA: Insufficient documentation

## 2023-06-21 DIAGNOSIS — I700 Atherosclerosis of aorta: Secondary | ICD-10-CM | POA: Diagnosis not present

## 2023-06-21 DIAGNOSIS — Z7984 Long term (current) use of oral hypoglycemic drugs: Secondary | ICD-10-CM | POA: Diagnosis not present

## 2023-06-21 DIAGNOSIS — E1169 Type 2 diabetes mellitus with other specified complication: Secondary | ICD-10-CM

## 2023-06-21 NOTE — Assessment & Plan Note (Signed)
Recent CT abdomen reviewed-has 2 liver masses, about 3.5 cm and 1.5 cm Larger lesion is benign given stability from a 2021 chest CT but the more inferior 1.5 cm lesion was not covered on prior imaging Check MRI of liver with contrast CMP shows normal liver transaminases

## 2023-06-21 NOTE — Assessment & Plan Note (Addendum)
Mostly nocturia Could be due to nighttime dose of telmisartan-HCTZ Advised to take telmisartan-HCTZ in the morning If persistent, can give trial of tamsulosin

## 2023-06-21 NOTE — Assessment & Plan Note (Addendum)
Noted on CT chest On aspirin and statin 

## 2023-06-21 NOTE — Assessment & Plan Note (Addendum)
ER chart reviewed, including imaging Back pain resolved now

## 2023-06-21 NOTE — Patient Instructions (Addendum)
Please start taking Telmisartan-hydrochlorothiazide 40-12.5 mg in the morning.  You are being scheduled to get MRI of liver.  Please continue to take medications as prescribed.  Please continue to follow low carb diet and perform moderate exercise/walking at least 150 mins/week.

## 2023-06-21 NOTE — Assessment & Plan Note (Signed)
BP Readings from Last 1 Encounters:  06/21/23 122/82   Well-controlled with Telmisartan-hydrochlorothiazide 40-12.5 mg QD Counseled for compliance with the medications Advised DASH diet and moderate exercise/walking, at least 150 mins/week
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
## 2023-06-21 NOTE — Assessment & Plan Note (Signed)
 On Norco as needed for severe pain Followed by orthopedic surgery

## 2023-06-21 NOTE — Assessment & Plan Note (Signed)
 Lab Results  Component Value Date   HGBA1C 6.4 (H) 05/22/2023   Well-controlled Associated with HTN and HLD On metformin 1000 mg QD and Rybelsus 14 mg QD Advised to follow diabetic diet On statin and ARB F/u CMP and lipid panel Diabetic eye exam: Advised to follow up with Ophthalmology for diabetic eye exam

## 2023-06-22 ENCOUNTER — Ambulatory Visit (HOSPITAL_COMMUNITY): Payer: 59

## 2023-06-27 ENCOUNTER — Telehealth: Payer: Self-pay

## 2023-06-27 NOTE — Telephone Encounter (Signed)
Hydrocodone-Acetaminophen 5/325 MG  Qty 56 Tablets  Take 1 tablet by mouth every 6 (six) hours as needed for moderate pain (pain score 4-6).   PATIENT USES [facility] ON [address]

## 2023-06-29 ENCOUNTER — Other Ambulatory Visit: Payer: Self-pay | Admitting: Internal Medicine

## 2023-06-29 ENCOUNTER — Ambulatory Visit (HOSPITAL_COMMUNITY)
Admission: RE | Admit: 2023-06-29 | Discharge: 2023-06-29 | Disposition: A | Discharge status: Home / Self Care | Payer: 59 | Source: Ambulatory Visit | Attending: Internal Medicine | Admitting: Internal Medicine

## 2023-06-29 DIAGNOSIS — K7689 Other specified diseases of liver: Secondary | ICD-10-CM

## 2023-06-29 DIAGNOSIS — R350 Frequency of micturition: Secondary | ICD-10-CM

## 2023-06-29 DIAGNOSIS — D1803 Hemangioma of intra-abdominal structures: Secondary | ICD-10-CM | POA: Diagnosis not present

## 2023-06-29 DIAGNOSIS — E278 Other specified disorders of adrenal gland: Secondary | ICD-10-CM | POA: Diagnosis not present

## 2023-06-29 DIAGNOSIS — R160 Hepatomegaly, not elsewhere classified: Secondary | ICD-10-CM | POA: Diagnosis not present

## 2023-06-29 DIAGNOSIS — N281 Cyst of kidney, acquired: Secondary | ICD-10-CM | POA: Diagnosis not present

## 2023-06-29 DIAGNOSIS — I700 Atherosclerosis of aorta: Secondary | ICD-10-CM

## 2023-06-29 DIAGNOSIS — Z09 Encounter for follow-up examination after completed treatment for conditions other than malignant neoplasm: Secondary | ICD-10-CM

## 2023-06-29 DIAGNOSIS — M1712 Unilateral primary osteoarthritis, left knee: Secondary | ICD-10-CM

## 2023-06-29 DIAGNOSIS — E1169 Type 2 diabetes mellitus with other specified complication: Secondary | ICD-10-CM

## 2023-06-29 DIAGNOSIS — I10 Essential (primary) hypertension: Secondary | ICD-10-CM

## 2023-06-29 MED ORDER — GADOBUTROL 1 MMOL/ML IV SOLN
10.0000 mL | Freq: Once | INTRAVENOUS | Status: AC | PRN
  Administered 2023-06-29: 10 mL via INTRAVENOUS

## 2023-07-04 ENCOUNTER — Other Ambulatory Visit: Payer: Self-pay | Admitting: Orthopedic Surgery

## 2023-07-04 DIAGNOSIS — M1711 Unilateral primary osteoarthritis, right knee: Secondary | ICD-10-CM

## 2023-07-04 DIAGNOSIS — Z96651 Presence of right artificial knee joint: Secondary | ICD-10-CM

## 2023-07-04 DIAGNOSIS — M1712 Unilateral primary osteoarthritis, left knee: Secondary | ICD-10-CM

## 2023-07-04 MED ORDER — HYDROCODONE-ACETAMINOPHEN 5-325 MG PO TABS: 1.0000 | ORAL_TABLET | Freq: Four times a day (QID) | ORAL | 0 refills | Status: DC | PRN

## 2023-07-04 NOTE — Progress Notes (Signed)
.  medtsh Requested Prescriptions   Signed Prescriptions Disp Refills   HYDROcodone-acetaminophen (NORCO/VICODIN) 5-325 MG tablet 56 tablet 0    Sig: Take 1 tablet by mouth every 6 (six) hours as needed for moderate pain (pain score 4-6).

## 2023-07-23 ENCOUNTER — Telehealth: Payer: Self-pay

## 2023-07-23 NOTE — Telephone Encounter (Signed)
Hydrocodone-Acetaminophen 5/325 MG  Qty 56 Tablets Take 1 tablet by mouth every 6 (six) hours as needed for moderate pain (pain score 4-6).   Ibuprofen 800 MG  Qty 90 Tablets Take 1 tablet (800 mg total) by mouth every 8 (eight) hours as needed.   PATIENT USES [facility] ON [address]

## 2023-07-24 ENCOUNTER — Other Ambulatory Visit: Payer: Self-pay | Admitting: Orthopedic Surgery

## 2023-07-24 DIAGNOSIS — M1712 Unilateral primary osteoarthritis, left knee: Secondary | ICD-10-CM

## 2023-07-24 DIAGNOSIS — Z96651 Presence of right artificial knee joint: Secondary | ICD-10-CM

## 2023-07-24 DIAGNOSIS — M1711 Unilateral primary osteoarthritis, right knee: Secondary | ICD-10-CM

## 2023-07-24 MED ORDER — IBUPROFEN 800 MG PO TABS: 800.0000 mg | ORAL_TABLET | Freq: Three times a day (TID) | ORAL | 5 refills | Status: DC | PRN

## 2023-07-24 MED ORDER — HYDROCODONE-ACETAMINOPHEN 5-325 MG PO TABS: 1.0000 | ORAL_TABLET | Freq: Four times a day (QID) | ORAL | 0 refills | Status: DC | PRN

## 2023-08-07 ENCOUNTER — Other Ambulatory Visit: Payer: Self-pay | Admitting: Orthopedic Surgery

## 2023-08-07 ENCOUNTER — Telehealth: Payer: Self-pay | Admitting: Orthopedic Surgery

## 2023-08-07 DIAGNOSIS — M1711 Unilateral primary osteoarthritis, right knee: Secondary | ICD-10-CM

## 2023-08-07 DIAGNOSIS — M1712 Unilateral primary osteoarthritis, left knee: Secondary | ICD-10-CM

## 2023-08-07 DIAGNOSIS — Z96651 Presence of right artificial knee joint: Secondary | ICD-10-CM

## 2023-08-07 MED ORDER — HYDROCODONE-ACETAMINOPHEN 5-325 MG PO TABS: 1.0000 | ORAL_TABLET | Freq: Four times a day (QID) | ORAL | 0 refills | Status: DC | PRN

## 2023-08-07 NOTE — Telephone Encounter (Signed)
 Dr. Delfino Fellers pt - spoke w/the pt, he is requesting a refill on Hydrocodone  5-325m 56 tablets, every 6 hours PRN for moderate pain (pain score 4-6) to be sent to [facility] on [address]

## 2023-08-13 DIAGNOSIS — M79674 Pain in right toe(s): Secondary | ICD-10-CM | POA: Diagnosis not present

## 2023-08-13 DIAGNOSIS — M79671 Pain in right foot: Secondary | ICD-10-CM | POA: Diagnosis not present

## 2023-08-13 DIAGNOSIS — E1140 Type 2 diabetes mellitus with diabetic neuropathy, unspecified: Secondary | ICD-10-CM | POA: Diagnosis not present

## 2023-08-13 DIAGNOSIS — M79675 Pain in left toe(s): Secondary | ICD-10-CM | POA: Diagnosis not present

## 2023-08-13 DIAGNOSIS — L110 Acquired keratosis follicularis: Secondary | ICD-10-CM | POA: Diagnosis not present

## 2023-08-13 DIAGNOSIS — M79672 Pain in left foot: Secondary | ICD-10-CM | POA: Diagnosis not present

## 2023-08-20 ENCOUNTER — Other Ambulatory Visit: Payer: Self-pay | Admitting: Orthopedic Surgery

## 2023-08-20 DIAGNOSIS — M1711 Unilateral primary osteoarthritis, right knee: Secondary | ICD-10-CM

## 2023-08-20 DIAGNOSIS — Z96651 Presence of right artificial knee joint: Secondary | ICD-10-CM

## 2023-08-20 DIAGNOSIS — M1712 Unilateral primary osteoarthritis, left knee: Secondary | ICD-10-CM

## 2023-08-20 NOTE — Telephone Encounter (Signed)
DR. Romeo Apple   Patient called request refill on his pain medicine  HYDROcodone-acetaminophen (NORCO/VICODIN) 5-325 MG table    PHARMACY:  [facility] on [address]

## 2023-08-21 MED ORDER — HYDROCODONE-ACETAMINOPHEN 5-325 MG PO TABS
1.0000 | ORAL_TABLET | Freq: Four times a day (QID) | ORAL | 0 refills | Status: DC | PRN
Start: 2023-08-21 — End: 2023-09-11

## 2023-08-23 ENCOUNTER — Other Ambulatory Visit: Payer: Self-pay | Admitting: Internal Medicine

## 2023-08-23 DIAGNOSIS — E1169 Type 2 diabetes mellitus with other specified complication: Secondary | ICD-10-CM

## 2023-09-02 ENCOUNTER — Other Ambulatory Visit: Payer: Self-pay | Admitting: Internal Medicine

## 2023-09-02 DIAGNOSIS — J449 Chronic obstructive pulmonary disease, unspecified: Secondary | ICD-10-CM

## 2023-09-06 ENCOUNTER — Telehealth: Payer: Self-pay

## 2023-09-06 DIAGNOSIS — M1712 Unilateral primary osteoarthritis, left knee: Secondary | ICD-10-CM

## 2023-09-06 DIAGNOSIS — M1711 Unilateral primary osteoarthritis, right knee: Secondary | ICD-10-CM

## 2023-09-06 DIAGNOSIS — Z96651 Presence of right artificial knee joint: Secondary | ICD-10-CM

## 2023-09-06 NOTE — Telephone Encounter (Signed)
 Hydrocodone-Acetaminophen 5/325 MG Qty 56 Tablets   Take 1 tablet by mouth every 6 (six) hours as needed for moderate pain (pain score 4-6).   Tylenol  800 MG   PATIENT USES [facility] ON [address]

## 2023-09-11 MED ORDER — HYDROCODONE-ACETAMINOPHEN 5-325 MG PO TABS: 1.0000 | ORAL_TABLET | Freq: Four times a day (QID) | ORAL | 0 refills | Status: DC | PRN

## 2023-09-19 ENCOUNTER — Ambulatory Visit: Admitting: Orthopedic Surgery

## 2023-09-20 ENCOUNTER — Ambulatory Visit: Payer: 59 | Admitting: Internal Medicine

## 2023-09-20 ENCOUNTER — Encounter: Payer: Self-pay | Admitting: Internal Medicine

## 2023-09-20 VITALS — BP 126/80 | HR 71 | Ht 68.0 in | Wt 286.8 lb

## 2023-09-20 DIAGNOSIS — N529 Male erectile dysfunction, unspecified: Secondary | ICD-10-CM | POA: Diagnosis not present

## 2023-09-20 DIAGNOSIS — L309 Dermatitis, unspecified: Secondary | ICD-10-CM | POA: Diagnosis not present

## 2023-09-20 DIAGNOSIS — D1803 Hemangioma of intra-abdominal structures: Secondary | ICD-10-CM

## 2023-09-20 DIAGNOSIS — J449 Chronic obstructive pulmonary disease, unspecified: Secondary | ICD-10-CM | POA: Diagnosis not present

## 2023-09-20 DIAGNOSIS — I10 Essential (primary) hypertension: Secondary | ICD-10-CM | POA: Diagnosis not present

## 2023-09-20 DIAGNOSIS — E782 Mixed hyperlipidemia: Secondary | ICD-10-CM

## 2023-09-20 DIAGNOSIS — Z23 Encounter for immunization: Secondary | ICD-10-CM | POA: Diagnosis not present

## 2023-09-20 DIAGNOSIS — E279 Disorder of adrenal gland, unspecified: Secondary | ICD-10-CM

## 2023-09-20 DIAGNOSIS — E1169 Type 2 diabetes mellitus with other specified complication: Secondary | ICD-10-CM

## 2023-09-20 MED ORDER — RYBELSUS 14 MG PO TABS: 1.0000 | ORAL_TABLET | Freq: Every morning | ORAL | 3 refills | Status: DC

## 2023-09-20 MED ORDER — SILDENAFIL CITRATE 50 MG PO TABS: 50.0000 mg | ORAL_TABLET | Freq: Every day | ORAL | 0 refills | Status: DC | PRN

## 2023-09-20 MED ORDER — HALOBETASOL PROPIONATE 0.05 % EX CREA: 1.0000 | TOPICAL_CREAM | Freq: Two times a day (BID) | CUTANEOUS | 3 refills | Status: AC | PRN

## 2023-09-20 MED ORDER — TRIAMCINOLONE ACETONIDE 0.1 % EX CREA: TOPICAL_CREAM | Freq: Two times a day (BID) | CUTANEOUS | 3 refills | Status: AC

## 2023-09-20 MED ORDER — ATORVASTATIN CALCIUM 20 MG PO TABS: 20.0000 mg | ORAL_TABLET | Freq: Every day | ORAL | 3 refills | Status: DC

## 2023-09-20 MED ORDER — TELMISARTAN-HCTZ 40-12.5 MG PO TABS: 1.0000 | ORAL_TABLET | Freq: Every morning | ORAL | 3 refills | Status: DC

## 2023-09-20 NOTE — Assessment & Plan Note (Signed)
 Incidental finding on MRI abdomen - stable left adrenal nodule, myelolipoma No follow up needed currently

## 2023-09-20 NOTE — Assessment & Plan Note (Addendum)
On atorvastatin Checked lipid profile 

## 2023-09-20 NOTE — Patient Instructions (Signed)
 Please continue to take medications as prescribed.  Please continue to follow low carb diet and perform moderate exercise/walking at least 150 mins/week.

## 2023-09-20 NOTE — Assessment & Plan Note (Addendum)
 Lab Results  Component Value Date   HGBA1C 6.4 (H) 05/22/2023   Well-controlled Associated with HTN and HLD On metformin 1000 mg QD and Rybelsus 14 mg once daily - has run out of Rybelsus, refilled today Advised to follow diabetic diet On statin and ARB F/u CMP and lipid panel Diabetic eye exam: Advised to follow up with Ophthalmology for diabetic eye exam

## 2023-09-20 NOTE — Assessment & Plan Note (Signed)
 Incidental finding on CT abdomen, was found to have benign hepatic hemangioma on MRI of liver
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
## 2023-09-20 NOTE — Assessment & Plan Note (Signed)
 Well controlled with albuterol as needed Has had  pulmonology evaluation

## 2023-09-20 NOTE — Assessment & Plan Note (Signed)
 Has difficulty maintaining erection, prescribed sildenafil 50 mg PRN

## 2023-09-20 NOTE — Assessment & Plan Note (Signed)
 Reports history of eczema Uses Ultravate over trunk and Kenalog over forehead area - refilled, advised to use it PRN

## 2023-09-20 NOTE — Assessment & Plan Note (Signed)
 BP Readings from Last 1 Encounters:  09/20/23 126/80   Well-controlled with Telmisartan-hydrochlorothiazide 40-12.5 mg QD Counseled for compliance with the medications Advised DASH diet and moderate exercise/walking, at least 150 mins/week

## 2023-09-21 LAB — CMP14+EGFR
ALT: 42 IU/L (ref 0–44)
AST: 29 IU/L (ref 0–40)
Albumin: 4.3 g/dL (ref 3.8–4.9)
Alkaline Phosphatase: 90 IU/L (ref 44–121)
BUN/Creatinine Ratio: 18 (ref 9–20)
BUN: 19 mg/dL (ref 6–24)
Bilirubin Total: 0.4 mg/dL (ref 0.0–1.2)
CO2: 23 mmol/L (ref 20–29)
Calcium: 9.3 mg/dL (ref 8.7–10.2)
Chloride: 102 mmol/L (ref 96–106)
Creatinine, Ser: 1.08 mg/dL (ref 0.76–1.27)
Globulin, Total: 2.8 g/dL (ref 1.5–4.5)
Glucose: 92 mg/dL (ref 70–99)
Potassium: 4.4 mmol/L (ref 3.5–5.2)
Sodium: 139 mmol/L (ref 134–144)
Total Protein: 7.1 g/dL (ref 6.0–8.5)
eGFR: 79 mL/min/{1.73_m2} (ref >59)

## 2023-09-21 LAB — HEMOGLOBIN A1C
Est. average glucose Bld gHb Est-mCnc: 137 mg/dL
Hgb A1c MFr Bld: 6.4 % — ABNORMAL HIGH (ref 4.8–5.6)

## 2023-09-26 ENCOUNTER — Telehealth: Payer: Self-pay | Admitting: Orthopedic Surgery
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
## 2023-10-02 ENCOUNTER — Ambulatory Visit: Payer: 59

## 2023-10-02 VITALS — BP 126/80 | Ht 68.0 in | Wt 280.6 lb

## 2023-10-02 DIAGNOSIS — Z0000 Encounter for general adult medical examination without abnormal findings: Secondary | ICD-10-CM | POA: Diagnosis not present
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
## 2023-10-02 NOTE — Patient Instructions (Signed)
 Managing Pain Without Opioids Opioids are strong medicines used to treat moderate to severe pain. For some people, especially those who have long-term (chronic) pain, opioids may not be the best choice for pain management due to: Side effects like nausea, constipation, and sleepiness. The risk of addiction (opioid use disorder). The longer you take opioids, the greater your risk of addiction. Pain that lasts for more than 3 months is called chronic pain. Managing chronic pain usually requires more than one approach and is often provided by a team of health care providers working together (multidisciplinary approach). Pain management may be done at a pain management center or pain clinic. How to manage pain without the use of opioids Use non-opioid medicines Non-opioid medicines for pain may include: Over-the-counter or prescription non-steroidal anti-inflammatory drugs (NSAIDs). These may be the first medicines used for pain. They work well for muscle and bone pain, and they reduce swelling. Acetaminophen. This over-the-counter medicine may work well for milder pain but not swelling. Antidepressants. These may be used to treat chronic pain. A certain type of antidepressant (tricyclics) is often used. These medicines are given in lower doses for pain than when used for depression. Anticonvulsants. These are usually used to treat seizures but may also reduce nerve (neuropathic) pain. Muscle relaxants. These relieve pain caused by sudden muscle tightening (spasms). You may also use a pain medicine that is applied to the skin as a patch, cream, or gel (topical analgesic), such as a numbing medicine. These may cause fewer side effects than medicines taken by mouth. Do certain therapies as directed Some therapies can help with pain management. They include: Physical therapy. You will do exercises to gain strength and flexibility. A physical therapist may teach you exercises to move and stretch parts of  your body that are weak, stiff, or painful. You can learn these exercises at physical therapy visits and practice them at home. Physical therapy may also involve: Massage. Heat wraps or applying heat or cold to affected areas. Electrical signals that interrupt pain signals (transcutaneous electrical nerve stimulation, TENS). Weak lasers that reduce pain and swelling (low-level laser therapy). Signals from your body that help you learn to regulate pain (biofeedback). Occupational therapy. This helps you to learn ways to function at home and work with less pain. Recreational therapy. This involves trying new activities or hobbies, such as a physical activity or drawing. Mental health therapy, including: Cognitive behavioral therapy (CBT). This helps you learn coping skills for dealing with pain. Acceptance and commitment therapy (ACT) to change the way you think and react to pain. Relaxation therapies, including muscle relaxation exercises and mindfulness-based stress reduction. Pain management counseling. This may be individual, family, or group counseling.  Receive medical treatments Medical treatments for pain management include: Nerve block injections. These may include a pain blocker and anti-inflammatory medicines. You may have injections: Near the spine to relieve chronic back or neck pain. Into joints to relieve back or joint pain. Into nerve areas that supply a painful area to relieve body pain. Into muscles (trigger point injections) to relieve some painful muscle conditions. A medical device placed near your spine to help block pain signals and relieve nerve pain or chronic back pain (spinal cord stimulation device). Acupuncture. Follow these instructions at home Medicines Take over-the-counter and prescription medicines only as told by your health care provider. If you are taking pain medicine, ask your health care providers about possible side effects to watch out for. Do not  drive or use heavy machinery  while taking prescription opioid pain medicine. Lifestyle  Do not use drugs or alcohol to reduce pain. If you drink alcohol, limit how much you have to: 0-1 drink a day for women who are not pregnant. 0-2 drinks a day for men. Know how much alcohol is in a drink. In the [place] one drink equals one 12 oz bottle of beer (355 mL), one 5 oz glass of wine (148 mL), or one 1 oz glass of hard liquor (44 mL). Do not use any products that contain nicotine or tobacco. These products include cigarettes, chewing tobacco, and vaping devices, such as e-cigarettes. If you need help quitting, ask your health care provider. Eat a healthy diet and maintain a healthy weight. Poor diet and excess weight may make pain worse. Eat foods that are high in fiber. These include fresh fruits and vegetables, whole grains, and beans. Limit foods that are high in fat and processed sugars, such as fried and sweet foods. Exercise regularly. Exercise lowers stress and may help relieve pain. Ask your health care provider what activities and exercises are safe for you. If your health care provider approves, join an exercise class that combines movement and stress reduction. Examples include yoga and tai chi. Get enough sleep. Lack of sleep may make pain worse. Lower stress as much as possible. Practice stress reduction techniques as told by your therapist. General instructions Work with all your pain management providers to find the treatments that work best for you. You are an important member of your pain management team. There are many things you can do to reduce pain on your own. Consider joining an online or in-person support group for people who have chronic pain. Keep all follow-up visits. This is important. Where to find more information You can find more information about managing pain without opioids from: American Academy of Pain Medicine: painmed.org Institute for Chronic Pain:  instituteforchronicpain.org American Chronic Pain Association: theacpa.org Contact a health care provider if: You have side effects from pain medicine. Your pain gets worse or does not get better with treatments or home therapy. You are struggling with anxiety or depression. Summary Many types of pain can be managed without opioids. Chronic pain may respond better to pain management without opioids. Pain is best managed when you and a team of health care providers work together. Pain management without opioids may include non-opioid medicines, medical treatments, physical therapy, mental health therapy, and lifestyle changes. Tell your health care providers if your pain gets worse or is not being managed well enough. This information is not intended to replace advice given to you by your health care provider. Make sure you discuss any questions you have with your health care provider. Document Revised: 09/28/2020 Document Reviewed: 09/28/2020 Elsevier Patient Education  2024 Elsevier Inc.Mr. Terry Case , Thank you for taking time to come for your Medicare Wellness Visit. I appreciate your ongoing commitment to your health goals. Please review the following plan we discussed and let me know if I can assist you in the future.   Referrals/Orders/Follow-Ups/Clinician Recommendations: follow up in 1 year  This is a list of the screening recommended for you and due dates:  Health Maintenance  Topic Date Due   Zoster (Shingles) Vaccine (1 of 2) Never done   COVID-19 Vaccine (3 - Moderna risk series) 12/24/2019   Eye exam for diabetics  01/30/2024   Flu Shot  01/31/2024   Hemoglobin A1C  03/22/2024   Yearly kidney health urinalysis for diabetes  05/21/2024  Complete foot exam   05/21/2024   Screening for Lung Cancer  05/28/2024   Yearly kidney function blood test for diabetes  09/19/2024   Medicare Annual Wellness Visit  10/01/2024   DTaP/Tdap/Td vaccine (2 - Td or Tdap) 07/27/2026   Colon  Cancer Screening  08/07/2028   Pneumococcal Vaccination  Completed   Hepatitis C Screening  Completed   HIV Screening  Completed   HPV Vaccine  Aged Out    Advanced directives: (Declined) Advance directive discussed with you today. Even though you declined this today, please call our office should you change your mind, and we can give you the proper paperwork for you to fill out.  Next Medicare Annual Wellness Visit scheduled for next year: Yes

## 2023-10-03 ENCOUNTER — Ambulatory Visit: Admitting: Orthopedic Surgery

## 2023-10-03 DIAGNOSIS — M25511 Pain in right shoulder: Secondary | ICD-10-CM

## 2023-10-03 DIAGNOSIS — M1711 Unilateral primary osteoarthritis, right knee: Secondary | ICD-10-CM

## 2023-10-03 DIAGNOSIS — F1190 Opioid use, unspecified, uncomplicated: Secondary | ICD-10-CM

## 2023-10-03 DIAGNOSIS — Z96651 Presence of right artificial knee joint: Secondary | ICD-10-CM

## 2023-10-03 DIAGNOSIS — M1712 Unilateral primary osteoarthritis, left knee: Secondary | ICD-10-CM

## 2023-10-03 MED ORDER — HYDROCODONE-ACETAMINOPHEN 5-325 MG PO TABS: 1.0000 | ORAL_TABLET | Freq: Four times a day (QID) | ORAL | 0 refills | Status: DC | PRN

## 2023-10-03 MED ORDER — IBUPROFEN 800 MG PO TABS: 800.0000 mg | ORAL_TABLET | Freq: Three times a day (TID) | ORAL | 5 refills | Status: DC | PRN

## 2023-10-03 NOTE — Progress Notes (Signed)
 Chief Complaint  Patient presents with   Injections    R shoulder pain for 2 mos.    Encounter Diagnoses  Name Primary?   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  He is trying to avoid having any surgery.  The medications and injections have helped him and he would like to continue on the same manner    Meds ordered this encounter  Medications   HYDROcodone-acetaminophen (NORCO/VICODIN) 5-325 MG tablet    Sig: Take 1 tablet by mouth every 6 (six) hours as needed for moderate pain (pain score 4-6).    Dispense:  56 tablet    Refill:  0   ibuprofen (ADVIL) 800 MG tablet    Sig: Take 1 tablet (800 mg total) by mouth every 8 (eight) hours as needed.    Dispense:  90 tablet    Refill:  5     Procedure note the subacromial injection shoulder RIGHT    Verbal consent was obtained to inject the  RIGHT   Shoulder  Timeout was completed to confirm the injection site is a subacromial space of the  RIGHT  shoulder   Medication used Depo-Medrol 40 mg and lidocaine 1% 3 cc  Anesthesia was provided by ethyl chloride  The injection was performed in the RIGHT  posterior subacromial space. After pinning the skin with alcohol and anesthetized the skin with ethyl chloride the subacromial space was injected using a 20-gauge needle. There were no complications  Sterile dressing was applied.

## 2023-10-03 NOTE — Addendum Note (Signed)
 Addended by: Vickki Hearing on: 10/03/2023 11:39 AM   Modules accepted: Level of Service

## 2023-10-24 ENCOUNTER — Other Ambulatory Visit: Payer: Self-pay | Admitting: Orthopedic Surgery

## 2023-10-24 ENCOUNTER — Telehealth: Payer: Self-pay

## 2023-10-24 DIAGNOSIS — M1711 Unilateral primary osteoarthritis, right knee: Secondary | ICD-10-CM

## 2023-10-24 DIAGNOSIS — M1712 Unilateral primary osteoarthritis, left knee: Secondary | ICD-10-CM

## 2023-10-24 DIAGNOSIS — Z96651 Presence of right artificial knee joint: Secondary | ICD-10-CM

## 2023-10-24 MED ORDER — HYDROCODONE-ACETAMINOPHEN 5-325 MG PO TABS: 1.0000 | ORAL_TABLET | Freq: Four times a day (QID) | ORAL | 0 refills | Status: DC | PRN

## 2023-10-24 NOTE — Telephone Encounter (Signed)
 Hydrocodone -Acetaminophen  5/325 MG  Qty 56 Tablets   Take 1 tablet by mouth every 6 (six) hours as needed for moderate pain (pain score 4-6).   PATIENT USES [facility] ON [address]

## 2023-11-07 ENCOUNTER — Telehealth: Payer: Self-pay

## 2023-11-07 ENCOUNTER — Other Ambulatory Visit: Payer: Self-pay | Admitting: Orthopedic Surgery

## 2023-11-07 DIAGNOSIS — M1712 Unilateral primary osteoarthritis, left knee: Secondary | ICD-10-CM

## 2023-11-07 DIAGNOSIS — M1711 Unilateral primary osteoarthritis, right knee: Secondary | ICD-10-CM

## 2023-11-07 DIAGNOSIS — Z96651 Presence of right artificial knee joint: Secondary | ICD-10-CM

## 2023-11-07 MED ORDER — HYDROCODONE-ACETAMINOPHEN 5-325 MG PO TABS
1.0000 | ORAL_TABLET | Freq: Four times a day (QID) | ORAL | 0 refills | Status: DC | PRN
Start: 2023-11-07 — End: 2023-11-21

## 2023-11-07 NOTE — Telephone Encounter (Signed)
 Hydrocodone-Acetaminophen 5/325 MG  Qty 56 Tablets  Take 1 tablet by mouth every 6 (six) hours as needed for moderate pain (pain score 4-6).   PATIENT USES [facility] ON [address]

## 2023-11-12 DIAGNOSIS — M79671 Pain in right foot: Secondary | ICD-10-CM | POA: Diagnosis not present

## 2023-11-12 DIAGNOSIS — E1140 Type 2 diabetes mellitus with diabetic neuropathy, unspecified: Secondary | ICD-10-CM | POA: Diagnosis not present

## 2023-11-12 DIAGNOSIS — M79674 Pain in right toe(s): Secondary | ICD-10-CM | POA: Diagnosis not present

## 2023-11-12 DIAGNOSIS — M79675 Pain in left toe(s): Secondary | ICD-10-CM | POA: Diagnosis not present

## 2023-11-12 DIAGNOSIS — M79672 Pain in left foot: Secondary | ICD-10-CM | POA: Diagnosis not present

## 2023-11-12 DIAGNOSIS — L110 Acquired keratosis follicularis: Secondary | ICD-10-CM | POA: Diagnosis not present

## 2023-11-21 ENCOUNTER — Telehealth: Payer: Self-pay

## 2023-11-21 ENCOUNTER — Other Ambulatory Visit: Payer: Self-pay | Admitting: Orthopedic Surgery

## 2023-11-21 DIAGNOSIS — M1711 Unilateral primary osteoarthritis, right knee: Secondary | ICD-10-CM

## 2023-11-21 DIAGNOSIS — Z96651 Presence of right artificial knee joint: Secondary | ICD-10-CM

## 2023-11-21 DIAGNOSIS — M1712 Unilateral primary osteoarthritis, left knee: Secondary | ICD-10-CM

## 2023-11-21 MED ORDER — HYDROCODONE-ACETAMINOPHEN 5-325 MG PO TABS
1.0000 | ORAL_TABLET | Freq: Four times a day (QID) | ORAL | 0 refills | Status: DC | PRN
Start: 2023-11-21 — End: 2023-12-12

## 2023-11-21 NOTE — Telephone Encounter (Signed)
 Hydrocodone-Acetaminophen 5/325 MG  Qty 56 Tablets  Take 1 tablet by mouth every 6 (six) hours as needed for moderate pain (pain score 4-6).   PATIENT USES [facility] ON [address]

## 2023-12-01 ENCOUNTER — Other Ambulatory Visit: Payer: Self-pay | Admitting: Internal Medicine

## 2023-12-01 DIAGNOSIS — J449 Chronic obstructive pulmonary disease, unspecified: Secondary | ICD-10-CM

## 2023-12-12 ENCOUNTER — Telehealth: Payer: Self-pay

## 2023-12-12 ENCOUNTER — Other Ambulatory Visit: Payer: Self-pay | Admitting: Orthopedic Surgery

## 2023-12-12 DIAGNOSIS — M1711 Unilateral primary osteoarthritis, right knee: Secondary | ICD-10-CM

## 2023-12-12 DIAGNOSIS — M1712 Unilateral primary osteoarthritis, left knee: Secondary | ICD-10-CM

## 2023-12-12 DIAGNOSIS — Z96651 Presence of right artificial knee joint: Secondary | ICD-10-CM

## 2023-12-12 MED ORDER — HYDROCODONE-ACETAMINOPHEN 5-325 MG PO TABS: 1.0000 | ORAL_TABLET | Freq: Four times a day (QID) | ORAL | 0 refills | Status: DC | PRN

## 2023-12-12 NOTE — Telephone Encounter (Signed)
 Hydrocodone -Acetaminophen  5/325 MG  Qty 56 Tablets  PATIENT USES [facility] ON [address]

## 2023-12-12 NOTE — Progress Notes (Signed)
 Meds ordered this encounter  Medications   HYDROcodone -acetaminophen  (NORCO/VICODIN) 5-325 MG tablet    Sig: Take 1 tablet by mouth every 6 (six) hours as needed for moderate pain (pain score 4-6).    Dispense:  56 tablet    Refill:  0

## 2024-01-01 ENCOUNTER — Telehealth: Payer: Self-pay

## 2024-01-01 ENCOUNTER — Other Ambulatory Visit: Payer: Self-pay | Admitting: Orthopedic Surgery

## 2024-01-01 DIAGNOSIS — Z96651 Presence of right artificial knee joint: Secondary | ICD-10-CM

## 2024-01-01 DIAGNOSIS — M1711 Unilateral primary osteoarthritis, right knee: Secondary | ICD-10-CM

## 2024-01-01 DIAGNOSIS — M1712 Unilateral primary osteoarthritis, left knee: Secondary | ICD-10-CM

## 2024-01-01 MED ORDER — HYDROCODONE-ACETAMINOPHEN 5-325 MG PO TABS
1.0000 | ORAL_TABLET | Freq: Four times a day (QID) | ORAL | 0 refills | Status: DC | PRN
Start: 2024-01-01 — End: 2024-01-02

## 2024-01-01 NOTE — Telephone Encounter (Signed)
 Hydrocodone-Acetaminophen 5/325 MG  Qty 56 Tablets  Take 1 tablet by mouth every 6 (six) hours as needed for moderate pain (pain score 4-6).   PATIENT USES [facility] ON [address]

## 2024-01-02 ENCOUNTER — Other Ambulatory Visit: Payer: Self-pay | Admitting: Orthopedic Surgery

## 2024-01-02 DIAGNOSIS — Z96651 Presence of right artificial knee joint: Secondary | ICD-10-CM

## 2024-01-02 DIAGNOSIS — M1712 Unilateral primary osteoarthritis, left knee: Secondary | ICD-10-CM

## 2024-01-02 DIAGNOSIS — M1711 Unilateral primary osteoarthritis, right knee: Secondary | ICD-10-CM

## 2024-01-02 MED ORDER — HYDROCODONE-ACETAMINOPHEN 5-325 MG PO TABS: 1.0000 | ORAL_TABLET | Freq: Four times a day (QID) | ORAL | 0 refills | Status: DC | PRN

## 2024-01-16 ENCOUNTER — Other Ambulatory Visit: Payer: Self-pay | Admitting: Orthopedic Surgery

## 2024-01-16 DIAGNOSIS — M1711 Unilateral primary osteoarthritis, right knee: Secondary | ICD-10-CM

## 2024-01-16 DIAGNOSIS — M1712 Unilateral primary osteoarthritis, left knee: Secondary | ICD-10-CM

## 2024-01-16 DIAGNOSIS — Z96651 Presence of right artificial knee joint: Secondary | ICD-10-CM

## 2024-01-16 MED ORDER — HYDROCODONE-ACETAMINOPHEN 5-325 MG PO TABS: 1.0000 | ORAL_TABLET | Freq: Four times a day (QID) | ORAL | 0 refills | Status: DC | PRN

## 2024-01-16 NOTE — Telephone Encounter (Signed)
 DR. MARGRETTE   Patient called lvm needs refill on his pain medicine    HYDROcodone -acetaminophen  (NORCO/VICODIN) 5-325 MG tablet   Pharmacy  [facility] on [address]

## 2024-01-23 ENCOUNTER — Encounter: Payer: Self-pay | Admitting: Internal Medicine

## 2024-01-23 ENCOUNTER — Ambulatory Visit: Admitting: Internal Medicine

## 2024-01-23 VITALS — BP 132/83 | HR 75 | Ht 68.0 in | Wt 281.6 lb

## 2024-01-23 DIAGNOSIS — L309 Dermatitis, unspecified: Secondary | ICD-10-CM | POA: Diagnosis not present

## 2024-01-23 DIAGNOSIS — E559 Vitamin D deficiency, unspecified: Secondary | ICD-10-CM | POA: Diagnosis not present

## 2024-01-23 DIAGNOSIS — J449 Chronic obstructive pulmonary disease, unspecified: Secondary | ICD-10-CM

## 2024-01-23 DIAGNOSIS — E782 Mixed hyperlipidemia: Secondary | ICD-10-CM

## 2024-01-23 DIAGNOSIS — E1169 Type 2 diabetes mellitus with other specified complication: Secondary | ICD-10-CM

## 2024-01-23 DIAGNOSIS — Z0001 Encounter for general adult medical examination with abnormal findings: Secondary | ICD-10-CM

## 2024-01-23 DIAGNOSIS — I10 Essential (primary) hypertension: Secondary | ICD-10-CM | POA: Diagnosis not present

## 2024-01-23 NOTE — Assessment & Plan Note (Signed)
On atorvastatin Checked lipid profile 

## 2024-01-23 NOTE — Assessment & Plan Note (Signed)
 Lab Results  Component Value Date   HGBA1C 6.4 (H) 09/20/2023   Well-controlled Associated with HTN and HLD On metformin  1000 mg QD and Rybelsus  14 mg once daily - has run out of Rybelsus , refilled today Advised to follow diabetic diet On statin and ARB F/u CMP and lipid panel Diabetic eye exam: Advised to follow up with Ophthalmology for diabetic eye exam

## 2024-01-23 NOTE — Assessment & Plan Note (Signed)
 Reports history of eczema Uses Ultravate over trunk and Kenalog over forehead area - refilled, advised to use it PRN

## 2024-01-23 NOTE — Assessment & Plan Note (Signed)
Physical exam as documented. Fasting blood tests today. Advised to get Shingrix vaccines at local pharmacy.
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
## 2024-01-23 NOTE — Patient Instructions (Addendum)
Please continue to take medications as prescribed.  Please continue to follow low carb diet and perform moderate exercise/walking at least 150 mins/week.  Please consider getting Shingrix vaccine at local pharmacy.

## 2024-01-23 NOTE — Assessment & Plan Note (Signed)
 Well controlled with albuterol as needed Has had  pulmonology evaluation

## 2024-01-23 NOTE — Assessment & Plan Note (Signed)
 BP Readings from Last 1 Encounters:  01/23/24 132/83   Well-controlled with Telmisartan -hydrochlorothiazide 40-12.5 mg QD Counseled for compliance with the medications Advised DASH diet and moderate exercise/walking, at least 150 mins/week

## 2024-01-24 ENCOUNTER — Ambulatory Visit: Payer: Self-pay | Admitting: Internal Medicine

## 2024-01-24 LAB — CMP14+EGFR
ALT: 43 IU/L (ref 0–44)
AST: 29 IU/L (ref 0–40)
Albumin: 4.1 g/dL (ref 3.8–4.9)
Alkaline Phosphatase: 89 IU/L (ref 44–121)
BUN/Creatinine Ratio: 21 — ABNORMAL HIGH (ref 9–20)
BUN: 23 mg/dL (ref 6–24)
Bilirubin Total: 0.4 mg/dL (ref 0.0–1.2)
CO2: 23 mmol/L (ref 20–29)
Calcium: 9.6 mg/dL (ref 8.7–10.2)
Chloride: 104 mmol/L (ref 96–106)
Creatinine, Ser: 1.09 mg/dL (ref 0.76–1.27)
Globulin, Total: 2.8 g/dL (ref 1.5–4.5)
Glucose: 107 mg/dL — ABNORMAL HIGH (ref 70–99)
Potassium: 4.6 mmol/L (ref 3.5–5.2)
Sodium: 141 mmol/L (ref 134–144)
Total Protein: 6.9 g/dL (ref 6.0–8.5)
eGFR: 78 mL/min/1.73 (ref >59)

## 2024-01-24 LAB — LIPID PANEL
Chol/HDL Ratio: 2.9 ratio (ref 0.0–5.0)
Cholesterol, Total: 96 mg/dL — ABNORMAL LOW (ref 100–199)
HDL: 33 mg/dL — ABNORMAL LOW (ref >39)
LDL Chol Calc (NIH): 50 mg/dL (ref 0–99)
Triglycerides: 58 mg/dL (ref 0–149)
VLDL Cholesterol Cal: 13 mg/dL (ref 5–40)

## 2024-01-24 LAB — VITAMIN D 25 HYDROXY (VIT D DEFICIENCY, FRACTURES): Vit D, 25-Hydroxy: 29.1 ng/mL — ABNORMAL LOW (ref 30.0–100.0)

## 2024-01-24 LAB — CBC WITH DIFFERENTIAL/PLATELET
Basophils Absolute: 0.1 x10E3/uL (ref 0.0–0.2)
Basos: 1 %
EOS (ABSOLUTE): 0.2 x10E3/uL (ref 0.0–0.4)
Eos: 4 %
Hematocrit: 39.6 % (ref 37.5–51.0)
Hemoglobin: 12 g/dL — ABNORMAL LOW (ref 13.0–17.7)
Immature Grans (Abs): 0 x10E3/uL (ref 0.0–0.1)
Immature Granulocytes: 0 %
Lymphocytes Absolute: 1.4 x10E3/uL (ref 0.7–3.1)
Lymphs: 33 %
MCH: 26.4 pg — ABNORMAL LOW (ref 26.6–33.0)
MCHC: 30.3 g/dL — ABNORMAL LOW (ref 31.5–35.7)
MCV: 87 fL (ref 79–97)
Monocytes Absolute: 0.4 x10E3/uL (ref 0.1–0.9)
Monocytes: 10 %
Neutrophils Absolute: 2.2 x10E3/uL (ref 1.4–7.0)
Neutrophils: 52 %
Platelets: 230 x10E3/uL (ref 150–450)
RBC: 4.55 x10E6/uL (ref 4.14–5.80)
RDW: 14.1 % (ref 11.6–15.4)
WBC: 4.2 x10E3/uL (ref 3.4–10.8)

## 2024-01-24 LAB — HEMOGLOBIN A1C
Est. average glucose Bld gHb Est-mCnc: 134 mg/dL
Hgb A1c MFr Bld: 6.3 % — ABNORMAL HIGH (ref 4.8–5.6)

## 2024-01-24 LAB — TSH: TSH: 2.11 u[IU]/mL (ref 0.450–4.500)

## 2024-01-25 LAB — MICROALBUMIN / CREATININE URINE RATIO
Creatinine, Urine: 166.4 mg/dL
Microalb/Creat Ratio: 3 mg/g{creat} (ref 0–29)
Microalbumin, Urine: 5.2 ug/mL

## 2024-02-05 ENCOUNTER — Other Ambulatory Visit: Payer: Self-pay | Admitting: Internal Medicine

## 2024-02-05 DIAGNOSIS — J449 Chronic obstructive pulmonary disease, unspecified: Secondary | ICD-10-CM

## 2024-02-10 ENCOUNTER — Other Ambulatory Visit: Payer: Self-pay | Admitting: Orthopedic Surgery

## 2024-02-10 ENCOUNTER — Other Ambulatory Visit: Payer: Self-pay | Admitting: Internal Medicine

## 2024-02-10 DIAGNOSIS — M1712 Unilateral primary osteoarthritis, left knee: Secondary | ICD-10-CM

## 2024-02-10 DIAGNOSIS — Z96651 Presence of right artificial knee joint: Secondary | ICD-10-CM

## 2024-02-10 DIAGNOSIS — M1711 Unilateral primary osteoarthritis, right knee: Secondary | ICD-10-CM

## 2024-02-10 MED ORDER — HYDROCODONE-ACETAMINOPHEN 5-325 MG PO TABS: 1.0000 | ORAL_TABLET | Freq: Four times a day (QID) | ORAL | 0 refills | Status: DC | PRN

## 2024-02-10 NOTE — Telephone Encounter (Signed)
 DR. MARGRETTE  Patient came in requesting a refill on his medicine    HYDROcodone -acetaminophen  (NORCO/VICODIN) 5-325 MG tablet     Pharmacy:  [facility] [address]

## 2024-02-18 ENCOUNTER — Telehealth: Payer: Self-pay | Admitting: Internal Medicine

## 2024-02-18 NOTE — Telephone Encounter (Signed)
 Patient states he has blood in his stool. Requesting to see Dr Tobie but nothing available until Sept unless overbooked. Patient requesting a call back. Please advise Thank you

## 2024-02-18 NOTE — Telephone Encounter (Signed)
 Scheduled for Thursday at 11:00 am- attempted to reach but VM not set up will try again later.

## 2024-02-19 ENCOUNTER — Encounter (HOSPITAL_COMMUNITY): Payer: Self-pay | Admitting: Emergency Medicine

## 2024-02-19 ENCOUNTER — Emergency Department (HOSPITAL_COMMUNITY)
Admission: EM | Admit: 2024-02-19 | Discharge: 2024-02-19 | Disposition: A | Discharge status: Home / Self Care | Attending: Emergency Medicine | Admitting: Emergency Medicine

## 2024-02-19 ENCOUNTER — Other Ambulatory Visit: Payer: Self-pay

## 2024-02-19 DIAGNOSIS — T466X5A Adverse effect of antihyperlipidemic and antiarteriosclerotic drugs, initial encounter: Secondary | ICD-10-CM | POA: Insufficient documentation

## 2024-02-19 DIAGNOSIS — I10 Essential (primary) hypertension: Secondary | ICD-10-CM | POA: Diagnosis not present

## 2024-02-19 DIAGNOSIS — G720 Drug-induced myopathy: Secondary | ICD-10-CM | POA: Insufficient documentation

## 2024-02-19 DIAGNOSIS — M79606 Pain in leg, unspecified: Secondary | ICD-10-CM | POA: Diagnosis present

## 2024-02-19 DIAGNOSIS — E119 Type 2 diabetes mellitus without complications: Secondary | ICD-10-CM | POA: Diagnosis not present

## 2024-02-19 DIAGNOSIS — Z7982 Long term (current) use of aspirin: Secondary | ICD-10-CM | POA: Diagnosis not present

## 2024-02-19 DIAGNOSIS — M79605 Pain in left leg: Secondary | ICD-10-CM | POA: Diagnosis not present

## 2024-02-19 DIAGNOSIS — Z7984 Long term (current) use of oral hypoglycemic drugs: Secondary | ICD-10-CM | POA: Insufficient documentation

## 2024-02-19 LAB — CBC WITH DIFFERENTIAL/PLATELET
Abs Immature Granulocytes: 0 K/uL (ref 0.00–0.07)
Basophils Absolute: 0 K/uL (ref 0.0–0.1)
Basophils Relative: 1 %
Eosinophils Absolute: 0.1 K/uL (ref 0.0–0.5)
Eosinophils Relative: 3 %
HCT: 38.7 % — ABNORMAL LOW (ref 39.0–52.0)
Hemoglobin: 12.2 g/dL — ABNORMAL LOW (ref 13.0–17.0)
Immature Granulocytes: 0 %
Lymphocytes Relative: 25 %
Lymphs Abs: 1.3 K/uL (ref 0.7–4.0)
MCH: 26.8 pg (ref 26.0–34.0)
MCHC: 31.5 g/dL (ref 30.0–36.0)
MCV: 85.1 fL (ref 80.0–100.0)
Monocytes Absolute: 0.5 K/uL (ref 0.1–1.0)
Monocytes Relative: 10 %
Neutro Abs: 3.3 K/uL (ref 1.7–7.7)
Neutrophils Relative %: 61 %
Platelets: 228 K/uL (ref 150–400)
RBC: 4.55 MIL/uL (ref 4.22–5.81)
RDW: 14.2 % (ref 11.5–15.5)
WBC: 5.3 K/uL (ref 4.0–10.5)
nRBC: 0 % (ref 0.0–0.2)

## 2024-02-19 LAB — COMPREHENSIVE METABOLIC PANEL WITH GFR
ALT: 38 U/L (ref 0–44)
AST: 37 U/L (ref 15–41)
Albumin: 3.9 g/dL (ref 3.5–5.0)
Alkaline Phosphatase: 76 U/L (ref 38–126)
Anion gap: 10 (ref 5–15)
BUN: 26 mg/dL — ABNORMAL HIGH (ref 6–20)
CO2: 23 mmol/L (ref 22–32)
Calcium: 8.9 mg/dL (ref 8.9–10.3)
Chloride: 103 mmol/L (ref 98–111)
Creatinine, Ser: 1.03 mg/dL (ref 0.61–1.24)
GFR, Estimated: >60 mL/min (ref >60)
Glucose, Bld: 114 mg/dL — ABNORMAL HIGH (ref 70–99)
Potassium: 3.9 mmol/L (ref 3.5–5.1)
Sodium: 136 mmol/L (ref 135–145)
Total Bilirubin: 0.7 mg/dL (ref 0.0–1.2)
Total Protein: 7.5 g/dL (ref 6.5–8.1)

## 2024-02-19 LAB — URINALYSIS, ROUTINE W REFLEX MICROSCOPIC
Bilirubin Urine: NEGATIVE
Glucose, UA: NEGATIVE mg/dL
Hgb urine dipstick: NEGATIVE
Ketones, ur: NEGATIVE mg/dL
Leukocytes,Ua: NEGATIVE
Nitrite: NEGATIVE
Protein, ur: NEGATIVE mg/dL
Specific Gravity, Urine: 1.028 (ref 1.005–1.030)
pH: 5 (ref 5.0–8.0)

## 2024-02-19 LAB — CK: Total CK: 949 U/L — ABNORMAL HIGH (ref 49–397)

## 2024-02-19 LAB — MAGNESIUM: Magnesium: 2 mg/dL (ref 1.7–2.4)

## 2024-02-19 NOTE — Discharge Instructions (Addendum)
 STOP your Atorvastatin , as this is causing your muscle pain.  Follow-up with your primary care provider tomorrow as scheduled to discuss treatment of your high cholesterol and other ways.  Call the gastroenterologist, Dr. Shaaron, for outpatient follow-up of the blood in your stool.  If you develop new or worsening muscle pain, increased or recurrent blood in the stool, discolored urine such as tea colored urine, or any other new/concerning symptoms then return to the ER.

## 2024-02-19 NOTE — ED Notes (Signed)
 Patient Alert and oriented to baseline. Stable and ambulatory to baseline. Patient verbalized understanding of the discharge instructions.  Patient belongings were taken by the patient.

## 2024-02-19 NOTE — ED Triage Notes (Signed)
 Pt c/o L leg/knee/thigh pain that is shooting in nature x last night around 9:30 pm last night , states he put heat on it with some relief. States it started up again at 5 am this morning, shooting pain. States it comes and goes in his R leg but does not experience shooting pain in his R leg so he is concerned. 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 Rates pain 0/10 at this time. Also states he noticed red blood in his stool yesterday and when I wiped after having a bowel movement.

## 2024-02-19 NOTE — ED Provider Notes (Signed)
 [place] EMERGENCY DEPARTMENT AT [facility] Provider Note   CSN: [id number] Arrival date & time: 02/19/24  [id number]     Patient presents with: Leg Pain   Terry Case is a 58 y.o. male.   HPI 58 year old male with a history of diabetes, hypercholesterolemia, hypertension presents with leg pain.  For the past 4 months or so has been having issues with intermittent right leg spasms.  Yesterday evening he noticed a left leg spasm and then again had a recurrent spasm around 5 AM.  Currently has no symptoms.  No weakness or numbness in his extremities.  No chest pain or shortness of breath.  He has not had any recent travel or history of DVT.  No swelling in his legs.  Patient also endorses that he had blood in his stool yesterday during a bowel movement.  He states it was a mild amount and then there was some when he wiped though this went away after wiping a couple times.  No constipation.  No abdominal pain or dizziness.  He had a colonoscopy about 5 years ago.  Prior to Admission medications   Medication Sig Start Date End Date Taking? Authorizing Provider  albuterol  (VENTOLIN  HFA) 108 (90 Base) MCG/ACT inhaler INHALE 2 PUFFS INTO THE LUNGS EVERY 6 HOURS AS NEEDED FOR WHEEZING OR SHORTNESS OF BREATH 02/06/24   Tobie Suzzane POUR, MD  aspirin  EC 81 MG tablet Take 81 mg by mouth daily. Swallow whole.    [provider]  diphenhydrAMINE  (BENADRYL ) 25 MG tablet Take 50 mg by mouth in the morning.    [provider]  dorzolamide  (TRUSOPT ) 2 % ophthalmic solution Place 1 drop into both eyes 2 (two) times daily.    [provider]  halobetasol  (ULTRAVATE ) 0.05 % cream Apply 1 application  topically 2 (two) times daily as needed (eczema). 09/20/23   Tobie Suzzane POUR, MD  HYDROcodone -acetaminophen  (NORCO/VICODIN) 5-325 MG tablet Take 1 tablet by mouth every 6 (six) hours as needed for moderate pain (pain score 4-6). 02/10/24   Margrette Taft FORBES, MD  ibuprofen   (ADVIL ) 800 MG tablet Take 1 tablet (800 mg total) by mouth every 8 (eight) hours as needed. 10/03/23   Margrette Taft FORBES, MD  metFORMIN  (GLUCOPHAGE ) 1000 MG tablet TAKE 1 TABLET(1000 MG) BY MOUTH DAILY 08/23/23   Tobie Suzzane POUR, MD  Misc. Devices (3-IN-1 BEDSIDE TOILET) MISC Use after knee replacement M17.0 osteoarthritis knee length of need 51mo 03/22/21   Margrette Taft FORBES, MD  naloxone  (NARCAN ) nasal spray 4 mg/0.1 mL SMARTSIG:Both Nares 03/22/21   [provider]  Netarsudil -Latanoprost  (ROCKLATAN ) 0.02-0.005 % SOLN Place 1 drop into both eyes at bedtime.    [provider]  RYBELSUS  14 MG TABS Take 1 tablet (14 mg total) by mouth every morning. 09/20/23   Tobie Suzzane POUR, MD  sildenafil  (VIAGRA ) 50 MG tablet TAKE 1 TABLET(50 MG) BY MOUTH DAILY AS NEEDED FOR ERECTILE DYSFUNCTION 02/10/24   Tobie Suzzane POUR, MD  telmisartan -hydrochlorothiazide (MICARDIS  HCT) 40-12.5 MG tablet Take 1 tablet by mouth every morning. 09/20/23   Tobie Suzzane POUR, MD  tiZANidine  (ZANAFLEX ) 4 MG tablet Take 1 tablet (4 mg total) by mouth every 6 (six) hours as needed for muscle spasms. 06/13/23   Raford Lenis, MD  triamcinolone  cream (KENALOG ) 0.1 % Apply topically 2 (two) times daily. 09/20/23   Tobie Suzzane POUR, MD    Allergies: Fish allergy, Tomato, and Vicodin [hydrocodone -acetaminophen ]    Review of Systems  Respiratory:  Negative for shortness of breath.   Cardiovascular:  Negative for chest pain and leg swelling.  Musculoskeletal:  Positive for myalgias.  Neurological:  Negative for weakness and numbness.    Updated Vital Signs BP 131/83 (BP Location: Right Arm)   Pulse 80   Temp 98.8 F (37.1 C) (Oral)   Resp 19   SpO2 94%   Physical Exam Vitals and nursing note reviewed.  Constitutional:      Appearance: He is well-developed.  HENT:     Head: Normocephalic and atraumatic.  Cardiovascular:     Rate and Rhythm: Normal rate and regular rhythm.     Pulses:          Posterior tibial  pulses are 2+ on the right side and 2+ on the left side.  Pulmonary:     Effort: Pulmonary effort is normal.  Abdominal:     Palpations: Abdomen is soft.     Tenderness: There is no abdominal tenderness.  Genitourinary:    Comments: Patient defers rectal exam Musculoskeletal:     Right hip: Normal range of motion.     Left hip: Normal range of motion.     Right upper leg: No tenderness.     Left upper leg: No tenderness.     Right knee: Normal range of motion.     Left knee: Normal range of motion.     Right lower leg: No swelling or tenderness.     Left lower leg: No swelling or tenderness.  Skin:    General: Skin is warm and dry.  Neurological:     Mental Status: He is alert.     (all labs ordered are listed, but only abnormal results are displayed) Labs Reviewed  COMPREHENSIVE METABOLIC PANEL WITH GFR - Abnormal; Notable for the following components:      Result Value   Glucose, Bld 114 (*)    BUN 26 (*)    All other components within normal limits  CBC WITH DIFFERENTIAL/PLATELET - Abnormal; Notable for the following components:   Hemoglobin 12.2 (*)    HCT 38.7 (*)    All other components within normal limits  CK - Abnormal; Notable for the following components:   Total CK 949 (*)    All other components within normal limits  MAGNESIUM  URINALYSIS, ROUTINE W REFLEX MICROSCOPIC    EKG: None  Radiology: No results found.   Procedures   Medications Ordered in the ED - No data to display                                  Medical Decision Making Amount and/or Complexity of Data Reviewed Labs: ordered.    Details: CK9 100.  Unremarkable electrolytes and creatinine otherwise   Patient likely is having muscle pain from his statin.  He does have a mildly elevated CK but not consistent with significant rhabdomyolysis.  Urine is negative for any myoglobinuria.  Will have him stop his atorvastatin  and advised increased fluids.  Otherwise vital signs are  reassuring.  Low suspicion for DVT.  Neurovascularly intact.  Colonoscopy report reviewed from 2020.  He had some diverticulosis as well as a polyp that was removed.  He deferred rectal exam but has only had the 1 episode of blood in the stool.  No rectal pain.  I think is reasonable to have him follow back up with his gastroenterologist and will give return precautions.  Final diagnoses:  Statin myopathy    ED Discharge Orders     None          Freddi Hamilton, MD 02/19/24 [phone]

## 2024-02-20 ENCOUNTER — Ambulatory Visit: Admitting: Internal Medicine

## 2024-02-20 ENCOUNTER — Encounter: Payer: Self-pay | Admitting: Internal Medicine

## 2024-02-20 VITALS — BP 126/79 | HR 77 | Ht 68.0 in | Wt 279.4 lb

## 2024-02-20 DIAGNOSIS — Z09 Encounter for follow-up examination after completed treatment for conditions other than malignant neoplasm: Secondary | ICD-10-CM | POA: Diagnosis not present

## 2024-02-20 DIAGNOSIS — K602 Anal fissure, unspecified: Secondary | ICD-10-CM

## 2024-02-20 DIAGNOSIS — G720 Drug-induced myopathy: Secondary | ICD-10-CM | POA: Diagnosis not present

## 2024-02-20 DIAGNOSIS — E782 Mixed hyperlipidemia: Secondary | ICD-10-CM | POA: Diagnosis not present

## 2024-02-20 NOTE — Assessment & Plan Note (Signed)
 ER chart reviewed, including blood tests Had elevated CK, could be due to statin induced myopathy, now stopped
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
## 2024-02-20 NOTE — Patient Instructions (Signed)
 Please take CoQ10 - 100 mg once daily.  Please maintain at least 64 ounces of fluid intake in a day.  Please stop taking Atorvastatin .  Please continue to take other medications as prescribed.  Please continue to follow low carb diet.

## 2024-02-20 NOTE — Assessment & Plan Note (Addendum)
 Was on atorvastatin , had to stop due to statin myopathy (CK in 900s) Will repeat lipid profile later - if LDL elevated, will consider Repatha or Praluent Advised to follow DASH diet

## 2024-02-20 NOTE — Assessment & Plan Note (Addendum)
 Had markedly elevated CK Advised to stop atorvastatin  for now If he has elevation in LDL, will discuss Repatha or Praluent Advised to maintain adequate hydration Advised to take CoQ-10 - 100 mg QD for now

## 2024-02-20 NOTE — Assessment & Plan Note (Signed)
 2 episodes of noticing blood on tissue paper, could be related to anal fissure Advised to perform sitz bath Advised to maintain adequate hydration Colace as needed for constipation

## 2024-02-24 ENCOUNTER — Other Ambulatory Visit: Payer: Self-pay | Admitting: Orthopedic Surgery

## 2024-02-24 DIAGNOSIS — Z96651 Presence of right artificial knee joint: Secondary | ICD-10-CM

## 2024-02-24 DIAGNOSIS — M1711 Unilateral primary osteoarthritis, right knee: Secondary | ICD-10-CM

## 2024-02-24 DIAGNOSIS — M1712 Unilateral primary osteoarthritis, left knee: Secondary | ICD-10-CM

## 2024-02-24 MED ORDER — HYDROCODONE-ACETAMINOPHEN 5-325 MG PO TABS: 1.0000 | ORAL_TABLET | Freq: Four times a day (QID) | ORAL | 0 refills | Status: DC | PRN

## 2024-02-24 NOTE — Telephone Encounter (Signed)
 DR. MARGRETTE   Patient came in requesting refill on his medicine   HYDROcodone -acetaminophen  (NORCO/VICODIN) 5-325 MG tablet     ibuprofen  (ADVIL ) 800 MG tablet     Pharmacy:  [facility] on [address]

## 2024-03-09 ENCOUNTER — Telehealth: Payer: Self-pay | Admitting: Orthopedic Surgery

## 2024-03-09 DIAGNOSIS — M1711 Unilateral primary osteoarthritis, right knee: Secondary | ICD-10-CM

## 2024-03-09 DIAGNOSIS — Z96651 Presence of right artificial knee joint: Secondary | ICD-10-CM

## 2024-03-09 DIAGNOSIS — M1712 Unilateral primary osteoarthritis, left knee: Secondary | ICD-10-CM

## 2024-03-09 NOTE — Telephone Encounter (Signed)
 Dr. Areatha pt - pt presented to the office requesting a refill for Hydrocodone  5-325, 56 tablets, every 6hrs PRN for pain to be sent to [facility] on [address]

## 2024-03-11 MED ORDER — HYDROCODONE-ACETAMINOPHEN 5-325 MG PO TABS: 1.0000 | ORAL_TABLET | Freq: Four times a day (QID) | ORAL | 0 refills | Status: DC | PRN

## 2024-03-11 NOTE — Addendum Note (Signed)
 Addended by: MARGRETTE TAFT BRAVO on: 03/11/2024 09:08 AM   Modules accepted: Orders

## 2024-03-18 DIAGNOSIS — E1140 Type 2 diabetes mellitus with diabetic neuropathy, unspecified: Secondary | ICD-10-CM | POA: Diagnosis not present

## 2024-03-18 DIAGNOSIS — M79671 Pain in right foot: Secondary | ICD-10-CM | POA: Diagnosis not present

## 2024-03-18 DIAGNOSIS — M79674 Pain in right toe(s): Secondary | ICD-10-CM | POA: Diagnosis not present

## 2024-03-18 DIAGNOSIS — M79672 Pain in left foot: Secondary | ICD-10-CM | POA: Diagnosis not present

## 2024-03-18 DIAGNOSIS — M79675 Pain in left toe(s): Secondary | ICD-10-CM | POA: Diagnosis not present

## 2024-03-18 DIAGNOSIS — L110 Acquired keratosis follicularis: Secondary | ICD-10-CM | POA: Diagnosis not present

## 2024-04-06 ENCOUNTER — Telehealth: Payer: Self-pay

## 2024-04-06 NOTE — Telephone Encounter (Signed)
 error

## 2024-04-07 ENCOUNTER — Other Ambulatory Visit: Payer: Self-pay | Admitting: Orthopedic Surgery

## 2024-04-07 ENCOUNTER — Telehealth: Payer: Self-pay

## 2024-04-07 DIAGNOSIS — M1711 Unilateral primary osteoarthritis, right knee: Secondary | ICD-10-CM

## 2024-04-07 DIAGNOSIS — M1712 Unilateral primary osteoarthritis, left knee: Secondary | ICD-10-CM

## 2024-04-07 DIAGNOSIS — Z96651 Presence of right artificial knee joint: Secondary | ICD-10-CM

## 2024-04-07 MED ORDER — HYDROCODONE-ACETAMINOPHEN 5-325 MG PO TABS: 1.0000 | ORAL_TABLET | Freq: Four times a day (QID) | ORAL | 0 refills | Status: DC | PRN

## 2024-04-07 NOTE — Telephone Encounter (Signed)
 Hydrocodone -Acetaminophen  5/325 MG  Qty 56 Tablets  Take 1 tablet by mouth every 6 hours as needed for moderate pain.   PATIENT USES [facility] ON [address]

## 2024-04-20 ENCOUNTER — Telehealth: Payer: Self-pay

## 2024-04-20 NOTE — Telephone Encounter (Signed)
 Hydrocodone-Acetaminophen 5/325 MG  Qty 56 Tablets Take 1 tablet by mouth every 6 (six) hours as needed for moderate pain (pain score 4-6).   Ibuprofen 800 MG  Qty 90 Tablets Take 1 tablet (800 mg total) by mouth every 8 (eight) hours as needed.   PATIENT USES [facility] ON [address]

## 2024-04-21 ENCOUNTER — Other Ambulatory Visit: Payer: Self-pay | Admitting: Orthopedic Surgery

## 2024-04-21 DIAGNOSIS — Z96651 Presence of right artificial knee joint: Secondary | ICD-10-CM

## 2024-04-21 DIAGNOSIS — M1712 Unilateral primary osteoarthritis, left knee: Secondary | ICD-10-CM

## 2024-04-21 DIAGNOSIS — M1711 Unilateral primary osteoarthritis, right knee: Secondary | ICD-10-CM

## 2024-04-21 MED ORDER — IBUPROFEN 800 MG PO TABS: 800.0000 mg | ORAL_TABLET | Freq: Three times a day (TID) | ORAL | 5 refills | Status: AC | PRN

## 2024-04-21 MED ORDER — HYDROCODONE-ACETAMINOPHEN 5-325 MG PO TABS: 1.0000 | ORAL_TABLET | Freq: Four times a day (QID) | ORAL | 0 refills | Status: DC | PRN

## 2024-04-28 ENCOUNTER — Other Ambulatory Visit: Payer: Self-pay | Admitting: Internal Medicine

## 2024-04-28 DIAGNOSIS — J449 Chronic obstructive pulmonary disease, unspecified: Secondary | ICD-10-CM

## 2024-05-06 ENCOUNTER — Other Ambulatory Visit: Payer: Self-pay | Admitting: Orthopedic Surgery

## 2024-05-06 ENCOUNTER — Telehealth: Payer: Self-pay | Admitting: Orthopedic Surgery

## 2024-05-06 DIAGNOSIS — Z96651 Presence of right artificial knee joint: Secondary | ICD-10-CM

## 2024-05-06 DIAGNOSIS — M1712 Unilateral primary osteoarthritis, left knee: Secondary | ICD-10-CM

## 2024-05-06 DIAGNOSIS — M1711 Unilateral primary osteoarthritis, right knee: Secondary | ICD-10-CM

## 2024-05-06 MED ORDER — HYDROCODONE-ACETAMINOPHEN 5-325 MG PO TABS: 1.0000 | ORAL_TABLET | Freq: Four times a day (QID) | ORAL | 0 refills | Status: DC | PRN

## 2024-05-06 NOTE — Telephone Encounter (Signed)
 Dr. Areatha pt - pt presented to the office this morning requesting a refill for Hydrocodone  5-325, 56 tablets, every 6 hours PRN for moderate pain (pain score 4-6) to be sent to [facility] on [address]

## 2024-05-20 ENCOUNTER — Other Ambulatory Visit: Payer: Self-pay | Admitting: Orthopedic Surgery

## 2024-05-20 ENCOUNTER — Telehealth: Payer: Self-pay

## 2024-05-20 DIAGNOSIS — M1711 Unilateral primary osteoarthritis, right knee: Secondary | ICD-10-CM

## 2024-05-20 DIAGNOSIS — M1712 Unilateral primary osteoarthritis, left knee: Secondary | ICD-10-CM

## 2024-05-20 DIAGNOSIS — Z96651 Presence of right artificial knee joint: Secondary | ICD-10-CM

## 2024-05-20 MED ORDER — HYDROCODONE-ACETAMINOPHEN 5-325 MG PO TABS: 1.0000 | ORAL_TABLET | Freq: Four times a day (QID) | ORAL | 0 refills | Status: DC | PRN

## 2024-05-20 NOTE — Telephone Encounter (Signed)
 Hydrocodone-Acetaminophen 5/325 MG  Qty 56 Tablets  Take 1 tablet by mouth every 6 (six) hours as needed for moderate pain (pain score 4-6).   PATIENT USES [facility] ON [address]

## 2024-06-03 ENCOUNTER — Telehealth: Payer: Self-pay

## 2024-06-03 ENCOUNTER — Other Ambulatory Visit: Payer: Self-pay | Admitting: Orthopedic Surgery

## 2024-06-03 DIAGNOSIS — M1711 Unilateral primary osteoarthritis, right knee: Secondary | ICD-10-CM

## 2024-06-03 DIAGNOSIS — Z96651 Presence of right artificial knee joint: Secondary | ICD-10-CM

## 2024-06-03 DIAGNOSIS — M1712 Unilateral primary osteoarthritis, left knee: Secondary | ICD-10-CM

## 2024-06-03 MED ORDER — HYDROCODONE-ACETAMINOPHEN 5-325 MG PO TABS: 1.0000 | ORAL_TABLET | Freq: Four times a day (QID) | ORAL | 0 refills | Status: DC | PRN

## 2024-06-03 NOTE — Telephone Encounter (Signed)
 Hydrocodone-Acetaminophen 5/325 MG  Qty 56 Tablets  Take 1 tablet by mouth every 6 (six) hours as needed for moderate pain (pain score 4-6).   PATIENT USES [facility] ON [address]

## 2024-06-04 ENCOUNTER — Other Ambulatory Visit: Payer: Self-pay | Admitting: Internal Medicine

## 2024-06-04 DIAGNOSIS — E1169 Type 2 diabetes mellitus with other specified complication: Secondary | ICD-10-CM

## 2024-06-05 ENCOUNTER — Ambulatory Visit: Payer: Self-pay

## 2024-06-05 NOTE — Telephone Encounter (Signed)
 FYI Only or Action Required?: FYI only for provider: appointment scheduled on 06/08/24.  Patient was last seen in primary care on 02/20/2024 by Tobie Suzzane POUR, MD.  Called Nurse Triage reporting Rectal Bleeding.  Symptoms began several days ago.  Interventions attempted: Nothing.  Symptoms are: unchanged.  Triage Disposition: See PCP When Office is Open (Within 3 Days)  Patient/caregiver understands and will follow disposition?: Yes  Copied from CRM #[id number]. Topic: Clinical - Red Word Triage >> Jun 05, 2024  1:38 PM Tiffini S wrote: [facility] that prompted transfer to Nurse Triage: have blood in stool- have pain on right side lower back and leg Reason for Disposition  MILD rectal bleeding (e.g., more than just a few drops or streaks)  Answer Assessment - Initial Assessment Questions Patient already scheduled for 06/08/24.  Advised ED/911 if symptoms worsen. Patient verbalized understanding.    1. APPEARANCE of BLOOD: What color is it? Is it passed separately, on the surface of the stool, or mixed in with the stool?      Smear on tissue; last BM today; normal brown, no blood mixed or in toilet, no abdominal or rectal pain 2. AMOUNT: How much blood was passed?      smear 3. FREQUENCY: How many times has blood been passed with the stools? X3; 3 different occasions      4. ONSET: When was the blood first seen in the stools? (Days or weeks)      2 months ago 5. DIARRHEA: Is there also some diarrhea? If Yes, ask: How many diarrhea stools in the past 24 hours?      none 6. CONSTIPATION: Do you have constipation? If Yes, ask: How bad is it?     no 7. RECURRENT SYMPTOMS: Have you had blood in your stools before? If Yes, ask: When was the last time? and What happened that time?      yes 8. BLOOD THINNERS: Do you take any blood thinners? (e.g., aspirin , clopidogrel / Plavix, coumadin, heparin). Notes: Other strong blood thinners include: Arixtra (fondaparinux),  Eliquis (apixaban), Pradaxa (dabigatran), and Xarelto (rivaroxaban).     aspirin  9. OTHER SYMPTOMS: Do you have any other symptoms?  (e.g., abdomen pain, vomiting, dizziness, fever)     denies BACK PAIN: Lower right back pain radiates to right thigh; comes and goes, 8/10; ibuprofen  800mg  helps some  Comes and goes, worse at night when laying down, Standing for long time hurts back Reports intermittent numbness/ weakness while laying down or standing too long. Denies swelling, discolored Able to stand and walk,   Sx: right knee  Protocols used: Rectal Bleeding-A-AH

## 2024-06-08 ENCOUNTER — Ambulatory Visit: Payer: Self-pay | Admitting: Internal Medicine

## 2024-06-17 ENCOUNTER — Telehealth: Payer: Self-pay | Admitting: Orthopedic Surgery

## 2024-06-17 ENCOUNTER — Other Ambulatory Visit: Payer: Self-pay | Admitting: Orthopedic Surgery

## 2024-06-17 DIAGNOSIS — M1712 Unilateral primary osteoarthritis, left knee: Secondary | ICD-10-CM

## 2024-06-17 DIAGNOSIS — Z96651 Presence of right artificial knee joint: Secondary | ICD-10-CM

## 2024-06-17 DIAGNOSIS — M1711 Unilateral primary osteoarthritis, right knee: Secondary | ICD-10-CM

## 2024-06-17 MED ORDER — HYDROCODONE-ACETAMINOPHEN 5-325 MG PO TABS: 1.0000 | ORAL_TABLET | Freq: Four times a day (QID) | ORAL | 0 refills | Status: DC | PRN

## 2024-06-17 NOTE — Telephone Encounter (Signed)
 Dr. Areatha pt - pt presented to the office requesting a refill for Hydrocodone  5-325, 56 tablets, Every 6 hours PRN for moderate pain (pain score 4-6) to be sent to [facility] on [address]

## 2024-06-30 ENCOUNTER — Other Ambulatory Visit: Payer: Self-pay | Admitting: Internal Medicine

## 2024-06-30 DIAGNOSIS — I10 Essential (primary) hypertension: Secondary | ICD-10-CM

## 2024-07-03 ENCOUNTER — Telehealth: Payer: Self-pay

## 2024-07-03 ENCOUNTER — Other Ambulatory Visit: Payer: Self-pay | Admitting: Orthopedic Surgery

## 2024-07-03 DIAGNOSIS — M1712 Unilateral primary osteoarthritis, left knee: Secondary | ICD-10-CM

## 2024-07-03 DIAGNOSIS — M1711 Unilateral primary osteoarthritis, right knee: Secondary | ICD-10-CM

## 2024-07-03 DIAGNOSIS — Z96651 Presence of right artificial knee joint: Secondary | ICD-10-CM

## 2024-07-03 MED ORDER — HYDROCODONE-ACETAMINOPHEN 5-325 MG PO TABS: 1.0000 | ORAL_TABLET | Freq: Four times a day (QID) | ORAL | 0 refills | Status: DC | PRN

## 2024-07-03 NOTE — Telephone Encounter (Signed)
 Hydrocodone -Acetaminophen  5/325 MG  Qty 56 Tablets   Take 1 tablet by mouth every 6 (six) hours as needed for moderate pain (pain score 4-6).   PATIENT USES [facility] ON [address]

## 2024-07-03 NOTE — Progress Notes (Signed)
 Meds ordered this encounter  Medications   HYDROcodone -acetaminophen  (NORCO/VICODIN) 5-325 MG tablet    Sig: Take 1 tablet by mouth every 6 (six) hours as needed for moderate pain (pain score 4-6).    Dispense:  56 tablet    Refill:  0

## 2024-07-15 ENCOUNTER — Encounter: Payer: Self-pay | Admitting: Internal Medicine

## 2024-07-15 ENCOUNTER — Ambulatory Visit: Payer: Self-pay | Admitting: Internal Medicine

## 2024-07-15 VITALS — BP 134/88 | HR 72 | Ht 68.0 in | Wt 279.8 lb

## 2024-07-15 DIAGNOSIS — E782 Mixed hyperlipidemia: Secondary | ICD-10-CM | POA: Diagnosis not present

## 2024-07-15 DIAGNOSIS — Z23 Encounter for immunization: Secondary | ICD-10-CM | POA: Diagnosis not present

## 2024-07-15 DIAGNOSIS — G720 Drug-induced myopathy: Secondary | ICD-10-CM

## 2024-07-15 DIAGNOSIS — I10 Essential (primary) hypertension: Secondary | ICD-10-CM

## 2024-07-15 DIAGNOSIS — R350 Frequency of micturition: Secondary | ICD-10-CM

## 2024-07-15 DIAGNOSIS — Z7984 Long term (current) use of oral hypoglycemic drugs: Secondary | ICD-10-CM

## 2024-07-15 DIAGNOSIS — N401 Enlarged prostate with lower urinary tract symptoms: Secondary | ICD-10-CM | POA: Diagnosis not present

## 2024-07-15 DIAGNOSIS — E1169 Type 2 diabetes mellitus with other specified complication: Secondary | ICD-10-CM | POA: Diagnosis not present

## 2024-07-15 DIAGNOSIS — T466X5A Adverse effect of antihyperlipidemic and antiarteriosclerotic drugs, initial encounter: Secondary | ICD-10-CM | POA: Diagnosis not present

## 2024-07-15 DIAGNOSIS — J449 Chronic obstructive pulmonary disease, unspecified: Secondary | ICD-10-CM | POA: Diagnosis not present

## 2024-07-15 MED ORDER — TAMSULOSIN HCL 0.4 MG PO CAPS: 0.4000 mg | ORAL_CAPSULE | Freq: Every day | ORAL | 1 refills | Status: AC

## 2024-07-15 MED ORDER — RYBELSUS 14 MG PO TABS: 1.0000 | ORAL_TABLET | Freq: Every morning | ORAL | 3 refills | Status: AC

## 2024-07-15 MED ORDER — TELMISARTAN-HCTZ 40-12.5 MG PO TABS: 1.0000 | ORAL_TABLET | Freq: Every morning | ORAL | 3 refills | Status: AC

## 2024-07-15 NOTE — Assessment & Plan Note (Addendum)
 BP Readings from Last 1 Encounters:  07/15/24 134/88   Well-controlled with Telmisartan -HCTZ 40-12.5 mg QD Counseled for compliance with the medications Advised DASH diet and moderate exercise/walking, at least 150 mins/week

## 2024-07-15 NOTE — Assessment & Plan Note (Signed)
 Was on atorvastatin , had to stop due to statin myopathy (CK in 900s) Will repeat lipid profile later - if LDL elevated, will consider Repatha or Praluent Advised to follow DASH diet
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
## 2024-07-15 NOTE — Assessment & Plan Note (Signed)
 Mostly nocturia Due to persistent symptoms, will give trial of tamsulosin  UA was unremarkable in the last visit

## 2024-07-15 NOTE — Patient Instructions (Addendum)
 Please stop taking Metformin . Please continue taking Rybelsus .  Please start taking Tamsulosin  for urinary frequency/prostate.  Please take Senna as needed for constipation.  Please continue to take medications as prescribed.  Please continue to follow low carb diet and perform moderate exercise/walking at least 150 mins/week.  Please call [facility] to schedule eye exam -  [phone].

## 2024-07-15 NOTE — Assessment & Plan Note (Signed)
 Well controlled with albuterol  as needed Has had  pulmonology evaluation Quit smoking in 2013

## 2024-07-15 NOTE — Assessment & Plan Note (Signed)
 Had markedly elevated CK Advised to stop atorvastatin  for now If he has elevation in LDL, will discuss Repatha or Praluent Advised to maintain adequate hydration Advised to take CoQ-10 - 100 mg QD for now

## 2024-07-15 NOTE — Assessment & Plan Note (Signed)
 Lab Results  Component Value Date   HGBA1C 6.3 (H) 01/23/2024   Well-controlled Associated with HTN and HLD On metformin  1000 mg QD and Rybelsus  14 mg once daily - has run out of Rybelsus , refilled today Due to diarrhea, discontinue Metformin  for now Advised to follow diabetic diet On statin and ARB F/u CMP and lipid panel Diabetic eye exam: Advised to follow up with Ophthalmology for diabetic eye exam

## 2024-07-16 ENCOUNTER — Ambulatory Visit: Payer: Self-pay | Admitting: Internal Medicine

## 2024-07-16 LAB — HEMOGLOBIN A1C
Est. average glucose Bld gHb Est-mCnc: 126 mg/dL
Hgb A1c MFr Bld: 6 % — ABNORMAL HIGH (ref 4.8–5.6)

## 2024-07-16 LAB — CMP14+EGFR
ALT: 34 IU/L (ref 0–44)
AST: 21 IU/L (ref 0–40)
Albumin: 4.4 g/dL (ref 3.8–4.9)
Alkaline Phosphatase: 82 IU/L (ref 47–123)
BUN/Creatinine Ratio: 19 (ref 10–24)
BUN: 19 mg/dL (ref 8–27)
Bilirubin Total: 0.3 mg/dL (ref 0.0–1.2)
CO2: 21 mmol/L (ref 20–29)
Calcium: 9.4 mg/dL (ref 8.6–10.2)
Chloride: 105 mmol/L (ref 96–106)
Creatinine, Ser: 1.01 mg/dL (ref 0.76–1.27)
Globulin, Total: 2.9 g/dL (ref 1.5–4.5)
Glucose: 93 mg/dL (ref 70–99)
Potassium: 4.6 mmol/L (ref 3.5–5.2)
Sodium: 140 mmol/L (ref 134–144)
Total Protein: 7.3 g/dL (ref 6.0–8.5)
eGFR: 85 mL/min/1.73

## 2024-07-17 ENCOUNTER — Telehealth: Payer: Self-pay

## 2024-07-17 ENCOUNTER — Other Ambulatory Visit: Payer: Self-pay | Admitting: Orthopedic Surgery

## 2024-07-17 DIAGNOSIS — Z96651 Presence of right artificial knee joint: Secondary | ICD-10-CM

## 2024-07-17 DIAGNOSIS — M1711 Unilateral primary osteoarthritis, right knee: Secondary | ICD-10-CM

## 2024-07-17 DIAGNOSIS — M1712 Unilateral primary osteoarthritis, left knee: Secondary | ICD-10-CM

## 2024-07-17 MED ORDER — HYDROCODONE-ACETAMINOPHEN 5-325 MG PO TABS: 1.0000 | ORAL_TABLET | Freq: Four times a day (QID) | ORAL | 0 refills | Status: DC | PRN

## 2024-07-17 NOTE — Telephone Encounter (Signed)
 Hydrocodone -Acetaminophen  5/325 MG  Qty 56 Tablets   Take 1 tablet by mouth every 6 (six) hours as needed for moderate pain (pain score 4-6).   PATIENT USES [facility] ON [address]

## 2024-07-23 ENCOUNTER — Ambulatory Visit: Admitting: Internal Medicine

## 2024-07-24 ENCOUNTER — Other Ambulatory Visit: Payer: Self-pay | Admitting: Internal Medicine

## 2024-07-24 DIAGNOSIS — J449 Chronic obstructive pulmonary disease, unspecified: Secondary | ICD-10-CM

## 2024-08-06 ENCOUNTER — Other Ambulatory Visit: Payer: Self-pay | Admitting: Orthopedic Surgery

## 2024-08-06 DIAGNOSIS — Z96651 Presence of right artificial knee joint: Secondary | ICD-10-CM

## 2024-08-06 DIAGNOSIS — M1712 Unilateral primary osteoarthritis, left knee: Secondary | ICD-10-CM

## 2024-08-06 DIAGNOSIS — M1711 Unilateral primary osteoarthritis, right knee: Secondary | ICD-10-CM

## 2024-08-06 MED ORDER — HYDROCODONE-ACETAMINOPHEN 5-325 MG PO TABS: 1.0000 | ORAL_TABLET | Freq: Four times a day (QID) | ORAL | 0 refills | Status: AC | PRN

## 2024-08-06 NOTE — Telephone Encounter (Signed)
 Dr. Areatha pt - pt presented to the office requesting a refill for Hydrocodone  5-325, 56 tablets, every 6 hours PRN for moderate pain (pain score 4-6) to be sent to [facility] on [address].

## 2024-09-08 ENCOUNTER — Ambulatory Visit: Admitting: Internal Medicine

## 2024-10-06 ENCOUNTER — Ambulatory Visit

## 2024-10-30 ENCOUNTER — Ambulatory Visit: Payer: Self-pay | Admitting: Internal Medicine
